# Patient Record
Sex: Female | Born: 1990
Health system: Southern US, Community
[De-identification: ages and names within clinical notes are randomized; demographics above are authoritative.]

## PROBLEM LIST (undated history)

## (undated) ENCOUNTER — Inpatient Hospital Stay (HOSPITAL_COMMUNITY): Payer: Self-pay

## (undated) DIAGNOSIS — G43909 Migraine, unspecified, not intractable, without status migrainosus: Secondary | ICD-10-CM

## (undated) DIAGNOSIS — N946 Dysmenorrhea, unspecified: Secondary | ICD-10-CM

## (undated) DIAGNOSIS — E119 Type 2 diabetes mellitus without complications: Secondary | ICD-10-CM

## (undated) DIAGNOSIS — N809 Endometriosis, unspecified: Secondary | ICD-10-CM

## (undated) DIAGNOSIS — R569 Unspecified convulsions: Secondary | ICD-10-CM

## (undated) HISTORY — DX: Endometriosis, unspecified: N80.9

## (undated) HISTORY — PX: NO PAST SURGERIES: SHX2092

---
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
## 2010-02-07 ENCOUNTER — Ambulatory Visit: Payer: Self-pay | Admitting: Family Medicine

## 2010-02-07 DIAGNOSIS — R300 Dysuria: Secondary | ICD-10-CM | POA: Insufficient documentation

## 2010-02-07 LAB — CONVERTED CEMR LAB
Glucose, Urine, Semiquant: NEGATIVE
Ketones, urine, test strip: NEGATIVE
Specific Gravity, Urine: 1.025
Urobilinogen, UA: 0.2

## 2010-06-21 ENCOUNTER — Ambulatory Visit: Payer: Self-pay | Admitting: Family Medicine

## 2010-06-21 DIAGNOSIS — R42 Dizziness and giddiness: Secondary | ICD-10-CM

## 2010-06-27 ENCOUNTER — Encounter: Payer: Self-pay | Admitting: Family Medicine

## 2010-07-07 ENCOUNTER — Encounter: Payer: Self-pay | Admitting: Family Medicine

## 2010-07-19 ENCOUNTER — Encounter: Payer: Self-pay | Admitting: Family Medicine

## 2010-07-26 ENCOUNTER — Encounter: Payer: Self-pay | Admitting: Family Medicine

## 2010-08-30 NOTE — Letter (Signed)
Summary: Generic Letter  [facility]  [address], [address]   [place], [place] [address]   Phone: [phone]  Fax: [phone]    06/27/2010  ORI KREITER [address] [place], [place]  [address]  Dear Ms. MARION,  We have scheduled you to see Dr. Gaetano Net (neurologists) for your migraines on 07/07/2010 @ 10:00am. Their office is located at [address] [facility] in [place]- phone number if needed is [phone]. Please keep this appointment and if unable please call their office to cancel and reschedule.         Sincerely,   Seymour Bars, DO

## 2010-08-30 NOTE — Assessment & Plan Note (Signed)
Summary: migraines   Vital Signs:  Patient profile:   20 year old female Height:      64.5 inches Weight:      115 pounds BMI:     19.51 O2 Sat:      100 % on Room air Temp:     98.6 degrees F oral Pulse rate:   71 / minute Pulse (ortho):   90 / minute BP sitting:   127 / 78  (left arm) BP standing:   114 / 72 Cuff size:   regular  Vitals Entered By: Payton Spark CMA (June 21, 2010 4:00 PM)  O2 Flow:  Room air  Serial Vital Signs/Assessments:  Time      Position  BP       Pulse  Resp  Temp     By 4:24 PM   Lying RA  115/66   71                    Payton Spark CMA 4:24 PM   Sitting   107/73   82                    Payton Spark CMA 4:24 PM   Standing  114/72   90                    Payton Spark CMA  CC: Migraines   Primary Care Provider:  Seymour Bars DO  CC:  Migraines.  History of Present Illness: 20 yo WF presents for migraines that have started to occur more frequently the past 2 mos.  They are occuring  ~5 x a wk.  She is waking up with them sometimes.  She is using Tylenol or Excedrin migraine which helps little.  She denies photo or phonophobia.  She has mild nausea, no vomitting.  Has tingling in arms and shooting pains to the neck with migraines.  She took Amitriptyline last year which helped but she c/o taking it regularly and it causing too much sleepiness.  She is eating only 1 meal/ day and feels 'like she's going to pass out' if she gets up too quickly.  Her family is concerned that she has a fam hx of seizures though no one has witness any sz - like activity from her.    Current Medications (verified): 1)  None  Allergies (verified): No Known Drug Allergies  Past History:  Past Medical History: Reviewed history from 07/29/2009 and no changes required. MRSA 3-09. migraines  Social History: Reviewed history from 03/21/2007 and no changes required. Student at [employer] Lives w/ mom, dad and younger brother. Nonsmoker. Fair grades. No  extra-curricular activities  Review of Systems      See HPI  Physical Exam  General:  alert, well-developed, well-nourished, and well-hydrated.   Head:  normocephalic and atraumatic.   Eyes:  pupils equal, pupils round, and pupils reactive to light.   Ears:  EACs patent; TMs translucent and gray with good cone of light and bony landmarks.  Mouth:  good dentition and pharynx pink and moist.   Neck:  no masses.   Lungs:  Normal respiratory effort, chest expands symmetrically. Lungs are clear to auscultation, no crackles or wheezes. Heart:  Normal rate and regular rhythm. S1 and S2 normal without gallop, murmur, click, rub or other extra sounds. Neurologic:  cranial nerves II-XII intact and gait normal.   Skin:  color normal.   Psych:  good eye contact, not  anxious appearing, and not depressed appearing.     Impression & Recommendations:  Problem # 1:  MIGRAINE-COMMON (ICD-346.10) Assessment Deteriorated Having almost daily HAs again, off prophylactic meds. H/o given on migraine causes/ prevention today. Will start her on Topamax for migraine prevention with rescue use of Naratriptan for severe HAs.   Will refer to neurology per family wishes given fam hx of seizures, though I think this is unrelated to her migraines.   The following medications were removed from the medication list:    Sumatriptan Succinate 100 Mg Tabs (Sumatriptan succinate) .Marland Kitchen... 1 tab by mouth x 1 as needed migraine onset; repeat in 2 hrs if needed Her updated medication list for this problem includes:    Naratriptan Hcl 2.5 Mg Tabs (Naratriptan hcl) .Marland Kitchen... 1 tab by mouth x 1 as needed migraine onset; repeat in 4 hrs if needed x 1  Orders: Neurology Referral (Neuro)  Problem # 2:  ORTHOSTATIC DIZZINESS (ICD-780.4) Ashley Watson's orthostatic BPs are normal today showing no drop with standing.   She may be having some neurologic symptoms with her migraines or this could be from inadequate food intake (eating only  once a day).  We discussed adequate hydration.    Complete Medication List: 1)  Topamax 25 Mg Tabs (Topiramate) .Marland Kitchen.. 1 tab by mouth at bedtime x 1 wk then increase to 2 tabs by mouth qhs 2)  Naratriptan Hcl 2.5 Mg Tabs (Naratriptan hcl) .Marland Kitchen.. 1 tab by mouth x 1 as needed migraine onset; repeat in 4 hrs if needed x 1  Patient Instructions: 1)  Read thru info on migraines. 2)  Referral made to Dr Gaetano Net. 3)  Start on Topamax - 25 mg at bedtime for the first wk, then increase to 50 mg at bedtime.  Dr Gaetano Net may adjust this up. 4)  Use Naratriptan for migraine rescue.  Take within first 1 of onset.  Do not take more than 2 x a wk. 5)  Use Excedrin Migraine for mild to moderate headache.  Do not take more than 3 x a wk. Prescriptions: NARATRIPTAN HCL 2.5 MG TABS (NARATRIPTAN HCL) 1 tab by mouth x 1 as needed migraine onset; repeat in 4 hrs if needed x 1  #9 tabs x 1   Entered and Authorized by:   Seymour Bars DO   Signed by:   Seymour Bars DO on 06/21/2010   Method used:   Electronically to        [facility]* (retail)       [address] [place], [place]  [address]       Ph: [phone]       Fax: [phone]   RxID:   [id number] TOPAMAX 25 MG TABS (TOPIRAMATE) 1 tab by mouth at bedtime x 1 wk then increase to 2 tabs by mouth qhs  #60 x 1   Entered and Authorized by:   Seymour Bars DO   Signed by:   Seymour Bars DO on 06/21/2010   Method used:   Electronically to        [facility]* (retail)       [address] [place], [place]  [address]       Ph: [phone]       Fax: [phone]   RxID:   [id number]    Orders Added: 1)  Neurology Referral [Neuro] 2)  Est. Patient Level IV [09323]

## 2010-08-30 NOTE — Assessment & Plan Note (Signed)
Summary: UA  Nurse Visit  CC: dysuria, lower pelvic pain, some frequency- started 1 week ago   Allergies: No Known Drug Allergies Laboratory Results   Urine Tests  Date/Time Received: 02/07/2010 Date/Time Reported: 02/07/2010  Routine Urinalysis   Color: brown Appearance: Cloudy Glucose: negative   (Normal Range: Negative) Bilirubin: negative   (Normal Range: Negative) Ketone: negative   (Normal Range: Negative) Spec. Gravity: 1.025   (Normal Range: 1.003-1.035) Blood: large   (Normal Range: Negative) pH: 5.5   (Normal Range: 5.0-8.0) Protein: >=300   (Normal Range: Negative) Urobilinogen: 0.2   (Normal Range: 0-1) Nitrite: negative   (Normal Range: Negative) Leukocyte Esterace: large   (Normal Range: Negative)       Orders Added: 1)  UA Dipstick w/o Micro (automated)  [81003] Prescriptions: MACROBID 100 MG CAPS (NITROFURANTOIN MONOHYD MACRO) 1 capsule by mouth q 12 hrs x 7 days  #14 x 0   Entered and Authorized by:   Seymour Bars DO   Signed by:   Seymour Bars DO on 02/07/2010   Method used:   Electronically to        [facility] [phone]* (retail)       [address] [place], [place]  [address]       Ph: [phone]       Fax: [phone]   RxID:   [id number]     Impression & Recommendations:  Problem # 1:  ACUTE CYSTITIS (ICD-595.0) UA grossly + for infection.  Treat with Macrobid x 7 days.  If symptoms are not improved by FRI AM, return to recollect Urine for UA and cx. The following medications were removed from the medication list:    Bactrim Ds 800-160 Mg Tabs (Sulfamethoxazole-trimethoprim) .Marland Kitchen... 1 tab by mouth two times a day x 3 days Her updated medication list for this problem includes:    Macrobid 100 Mg Caps (Nitrofurantoin monohyd macro) .Marland Kitchen... 1 capsule by mouth q 12 hrs x 7 days  Orders: UA Dipstick w/o Micro (automated)  (81003)  Complete Medication List: 1)  Jolessa 0.15-0.03 Mg Tabs (Levonorgest-eth estrad 91-day) .Marland Kitchen.. 1 tab by  mouth daily as directed 2)  Amitriptyline Hcl 25 Mg Tabs (Amitriptyline hcl) .Marland Kitchen.. 1 tab by mouth qhs 3)  Sumatriptan Succinate 100 Mg Tabs (Sumatriptan succinate) .Marland Kitchen.. 1 tab by mouth x 1 as needed migraine onset; repeat in 2 hrs if needed 4)  Macrobid 100 Mg Caps (Nitrofurantoin monohyd macro) .Marland Kitchen.. 1 capsule by mouth q 12 hrs x 7 days   Patient Instructions: 1)  Take 7 days of Macrobid for UTI. 2)  If symptoms have not resolved by Friday AM, call to recollect urine.

## 2010-09-01 NOTE — Consult Note (Signed)
Summary: [facility]  [facility]   Imported By: Lanelle Bal 07/15/2010 10:56:51  _____________________________________________________________________  External Attachment:    Type:   Image     Comment:   External Document

## 2010-09-01 NOTE — Letter (Signed)
Summary: [facility]  [facility]   Imported By: Lanelle Bal 08/10/2010 09:20:55  _____________________________________________________________________  External Attachment:    Type:   Image     Comment:   External Document

## 2012-03-14 ENCOUNTER — Ambulatory Visit (INDEPENDENT_AMBULATORY_CARE_PROVIDER_SITE_OTHER): Payer: BC Managed Care – PPO | Admitting: Sports Medicine

## 2012-03-14 ENCOUNTER — Encounter: Payer: Self-pay | Admitting: Sports Medicine

## 2012-03-14 VITALS — BP 128/71 | HR 90 | Temp 98.1°F | Resp 16 | Wt 103.0 lb

## 2012-03-14 DIAGNOSIS — B349 Viral infection, unspecified: Secondary | ICD-10-CM | POA: Insufficient documentation

## 2012-03-14 DIAGNOSIS — B9789 Other viral agents as the cause of diseases classified elsewhere: Secondary | ICD-10-CM

## 2012-03-14 MED ORDER — NYSTATIN 100000 UNIT/ML MT SUSP: 500000.0000 [IU] | Freq: Four times a day (QID) | OROMUCOSAL | Status: AC

## 2012-03-14 MED ORDER — MELOXICAM 15 MG PO TABS: ORAL_TABLET | ORAL | Status: DC

## 2012-03-14 NOTE — Patient Instructions (Signed)
Thrush, Adult   Thrush is a yeast infection that develops in the mouth and throat and on the tongue. The medical term for this is oropharyngeal candidiasis, or OPC. Thrush is most common in older adults, but it can occur at any age. Thrush occurs when a yeast called candida grows out of control. Candida normally is present in small amounts in the mouth and on other mucous membranes. However, under certain circumstances, candida can grow rapidly, causing thrush. Thrush can be a recurring problem for people who have chronic illnesses or who take medications that limit the body's ability to fight infection (weakened immune system). Since these people have difficulty fighting infections, the fungus that causes thrush can spread throughout the body. This can cause life-threatening blood or organ infections.  CAUSES   Candida, the yeast that causes thrush, is normally present in small amounts in the mouth and on other mucous membranes. It usually causes no harm. However, when conditions are present that allow the yeast to grow uncontrolled, it invades surrounding tissues and becomes an infection. Thrush is most commonly caused by the yeast Candida albicans. Less often, other forms of candida can lead to thrush.  There are many types of bacteria in your mouth that normally control the growth of candida. Sometimes a new type of bacteria gets into your mouth and disrupts the balance of the germs already there. This can allow candida to overgrow. Other factors that increase your risk of developing thrush include:   An impaired ability to fight infection (weakened immune system). A normal immune system is usually strong enough to prevent candida from overgrowing.   Older adults are more likely to develop thrush because they may have weaker immune systems.   People with human immunodeficiency virus (HIV) infection have a high likelihood of developing thrush. About 90% of people with HIV develop thrush at some point during  the course of their disease.   People with diabetes are more likely to get thrush because high blood sugar levels promote overgrowth of the candida fungus.   A dry mouth (xerostomia). Dry mouth can result from overuse of mouthwashes or from certain conditions such as Sjgren's syndrome.   Pregnancy. Hormone changes during pregnancy can lead to thrush by altering the balance of bacteria in the mouth.   Poor dental care, especially in people who have false teeth.   The use of antibiotic medications. This may lead to thrush by changing the balance of bacteria in the mouth.  SYMPTOMS   Thrush can be a mild infection that causes no symptoms. If symptoms develop, they may include the following:   A burning feeling in the mouth and throat. This can occur at the start of a thrush infection.   White patches that adhere to the mouth and tongue. The tissue around the patches may be red, raw, and painful. If rubbed (during tooth brushing, for example), the patches and the tissue of the mouth may bleed easily.   A bad taste in the mouth or difficulty tasting foods.   Cottony feeling in the mouth.   Sometimes pain during eating and swallowing.  DIAGNOSIS   Your caregiver can usually diagnose thrush by exam. In addition to looking in your mouth, your caregiver will ask you questions about your health.  TREATMENT   Medications that help prevent the growth of fungi (antifungals) are the standard treatment for thrush. These medications are either applied directly to the affected area (topical) or swallowed (oral).  Mild thrush  In   adults, mild cases of thrush may clear up with simple treatment that can be done at home. This treatment usually involves using an antifungal mouth rinse or lozenges. Treatment usually lasts about 14 days.  Moderate to severe thrush   More severe thrush infections that have spread to the esophagus are treated with an oral antifungal medication. A topical antifungal medication may also be  used.   For some severe infections, a treatment period longer than 14 days may be needed.   Oral antifungal medications are almost never used during pregnancy because the fetus may be harmed. However, if a pregnant woman has a rare, severe thrush infection that has spread to her blood, oral antifungal medications may be used. In this case, the risk of harm to the mother and fetus from the severe thrush infection may be greater than the risk posed by the use of antifungal medications.  Persistent or recurrent thrush  Persistent (does not go away) or recurrent (keeps coming back) cases of thrush may:   Need to be treated twice as long as the symptoms last.   Require treatment with both oral and topical antifungal medications.   People with weakened immune systems can take an antifungal medication on a continuous basis to prevent thrush infections.  It is important to treat conditions that make you more likely to get thrush, such as diabetes, human immunodeficiency virus (HIV), or cancer.   HOME CARE INSTRUCTIONS    If you are breast-feeding, you should clean your nipples with an antifungal medication, such as nystatin (Mycostatin). Dry your nipples after breast-feeding. Applying lanolin-containing body lotion may help relieve nipple soreness.   If you wear dentures and get thrush, remove dentures before going to bed, brush them vigorously, and soak in a solution of chlorhexidine gluconate or a product such as Polident or Efferdent.   Eating plain, unflavored yogurt that contains live cultures (check the label) can also help cure thrush. Yogurt helps healthy bacteria grow in the mouth. These bacteria stop the growth of the yeast that causes thrush.   Adults can treat thrush at home with gentian violet (1%), a dye that kills bacteria and fungi. It is available without a prescription. If there is no known cause for the infection or if gentian violet does not cure the thrush, you need to see your  caregiver.  Comfort measures  Measures can be taken to reduce the discomfort of thrush:   Drink cold liquids such as water or iced tea. Eat flavored ice treats or frozen juices.   Eat foods that are easy to swallow such as gelatin, ice cream, or custard.   If the patches are painful, try drinking from a straw.   Rinse your mouth several times a day with a warm saltwater rinse. You can make the saltwater mixture with 1 tsp (5 g) of salt in 8 fl oz (0.2 L) of warm water.  PROGNOSIS    Most cases of thrush are mild and clear up with the use of an antifungal mouth rinse or lozenges. Very mild cases of thrush may clear up without medical treatment. It usually takes about 14 days of treatment with an oral antifungal medication to cure more severe thrush infections. In some cases, thrush may last several weeks even with treatment.   If thrush goes untreated and does not go away by itself, it can spread to other parts of the body.   Thrush can spread to the throat, the vagina, or the skin. It rarely spreads   to other organs of the body.  Thrush is more likely to recur (come back) in:   People who use inhaled corticosteroids to treat asthma.   People who take antibiotic medications for a long time.   People who have false teeth.   People who have a weakened immune system.  RISKS AND COMPLICATIONS  Complications related to thrush are rare in healthy people.  There are several factors that can increase your risk of developing thrush.  Age  Older adults, especially those who have serious health problems, are more likely to develop thrush because their immune systems are likely to be weaker.  Behavior   The yeast that causes thrush can be spread by oral sex.   Heavy smoking can lower the body's ability to fight off infections. This makes thrush more likely to develop.  Other conditions   False teeth (dentures), braces, or a retainer that irritates the mouth make it hard to keep the mouth clean. An unclean mouth is  more likely to develop thrush than a clean mouth.   People with a weakened immune system, such as those who have diabetes or human immunodeficiency virus (HIV) or who are undergoing chemotherapy, have an increased risk for developing thrush.  Medications  Some medications can allow the fungus that causes thrush to grow uncontrolled. Common ones are:   Antibiotics, especially those that kill a wide range of organisms (broad-spectrum antibiotics), such as tetracycline commonly can cause thrush.   Birth control pills (oral contraceptives).   Medications that weaken the body's immune system, such as corticosteroids.  Environment  Exposure over time to certain environmental chemicals, such as benzene and pesticides, can weaken the body's immune system. This increases your risk for developing infections, including thrush.  SEEK IMMEDIATE MEDICAL CARE IF:   Your symptoms are getting worse or are not improving within 7 days of starting treatment.   You have symptoms of spreading infection, such as white patches on the skin outside of the mouth.   You are nursing and you have redness and pain in the nipples in spite of home treatment or if you have burning pain in the nipple area when you nurse. Your baby's mouth should also be examined to determine whether thrush is causing your symptoms.  Document Released: 04/11/2004 Document Revised: 07/06/2011 Document Reviewed: 07/22/2008  [employer] Patient Information 2012 [employer].

## 2012-03-14 NOTE — Assessment & Plan Note (Addendum)
Spots on tongue, malaise, sore throat, cough. We will give this 2 weeks, I will give her MOBIC in the meantime. We'll give her some nystatin to swish. I would like to see her back in 2 weeks, and she did have some weight loss approximately 10 pounds over a few years. If still no better at that point we would consider checking CBC, CMP, TSH, HIV, PPD. She's also due for Tdap.

## 2012-03-14 NOTE — Progress Notes (Signed)
Patient ID: APURVA REILY, female   DOB: [birth year], 21 y.o.   MRN: [id number] Subjective:    CC: Spots on tongue, cough.  HPI: Elain is a very pleasant 21 year old healthy female who comes in with symptoms of sore throat, spots on tongue that are whitish, malaise, cough for approximately 5 days. She denies any sick contacts, denies any headaches, neck stiffness, nausea, or vomiting, or diarrhea. She denies any rashes. She does cough occasionally, but this is essentially nonproductive. She's not had any changes in medication. She also denies any fevers, or chills. No muscle aches, or body aches. No shortness of breath.  Past medical history, Surgical history, Family history, Social history, Allergies, and medications have been entered into the medical record, reviewed, and no changes needed.   Review of Systems: No fevers, chills, night sweats, weight loss, chest pain, or shortness of breath.   Objective:    General: Well Developed, well nourished, and in no acute distress.  Neuro: Alert and oriented x3, extra-ocular muscles intact.  HEENT: Normocephalic, atraumatic, pupils equal round reactive to light, neck supple, no masses, no lymphadenopathy, thyroid nonpalpable.  There is some thrush on her tongue. She also has a mildly erythematous oropharynx. Skin: Warm and dry, no rashes. Cardiac: Regular rate and rhythm, no murmurs rubs or gallops.  Respiratory: Clear to auscultation bilaterally. Not using accessory muscles, speaking in full sentences.  Impression and Recommendations:

## 2012-03-28 ENCOUNTER — Ambulatory Visit: Payer: BC Managed Care – PPO | Admitting: Sports Medicine

## 2012-03-28 DIAGNOSIS — Z0289 Encounter for other administrative examinations: Secondary | ICD-10-CM

## 2012-04-19 ENCOUNTER — Ambulatory Visit (INDEPENDENT_AMBULATORY_CARE_PROVIDER_SITE_OTHER): Payer: BC Managed Care – PPO | Admitting: Sports Medicine

## 2012-04-19 ENCOUNTER — Other Ambulatory Visit: Payer: Self-pay | Admitting: Sports Medicine

## 2012-04-19 ENCOUNTER — Encounter: Payer: Self-pay | Admitting: Sports Medicine

## 2012-04-19 VITALS — BP 141/84 | HR 119 | Temp 97.4°F | Wt 105.0 lb

## 2012-04-19 DIAGNOSIS — Z299 Encounter for prophylactic measures, unspecified: Secondary | ICD-10-CM

## 2012-04-19 DIAGNOSIS — B349 Viral infection, unspecified: Secondary | ICD-10-CM

## 2012-04-19 DIAGNOSIS — Z0000 Encounter for general adult medical examination without abnormal findings: Secondary | ICD-10-CM | POA: Insufficient documentation

## 2012-04-19 DIAGNOSIS — Z23 Encounter for immunization: Secondary | ICD-10-CM

## 2012-04-19 DIAGNOSIS — N3000 Acute cystitis without hematuria: Secondary | ICD-10-CM

## 2012-04-19 DIAGNOSIS — Z298 Encounter for other specified prophylactic measures: Secondary | ICD-10-CM

## 2012-04-19 DIAGNOSIS — N390 Urinary tract infection, site not specified: Secondary | ICD-10-CM

## 2012-04-19 LAB — POCT URINALYSIS DIPSTICK
Glucose, UA: NEGATIVE
Ketones, UA: 15
Nitrite, UA: POSITIVE
Protein, UA: >=300
Spec Grav, UA: 1.025
Urobilinogen, UA: 1
pH, UA: 6

## 2012-04-19 MED ORDER — PHENAZOPYRIDINE HCL 200 MG PO TABS: 200.0000 mg | ORAL_TABLET | Freq: Three times a day (TID) | ORAL | Status: AC

## 2012-04-19 MED ORDER — CEPHALEXIN 500 MG PO CAPS: 500.0000 mg | ORAL_CAPSULE | Freq: Two times a day (BID) | ORAL | Status: DC

## 2012-04-19 NOTE — Progress Notes (Signed)
Subjective:    CC: Thrush, dysuria  HPI: Thrush: Have been seeing her for this for a couple of weeks now. I wanted her do some nystatin swash twice a day. Unfortunately she ran out after only a couple of days. It's a little better, but overall still present. At that time, she had a small amount of weight loss as well as some heat, cold intolerance and we discussed checking some blood work at today's visit.  Dysuria: Present for a couple of days, painful in the suprapubic region, pain and burning when urinating. She is currently menstruating. No flank pain.  Past medical history, Surgical history, Family history, Social history, Allergies, and medications have been entered into the medical record, reviewed, and no changes needed.   Review of Systems: No fevers, chills, night sweats, weight loss, chest pain, or shortness of breath.   Objective:    General: Well Developed, well nourished, and in no acute distress.  Neuro: Alert and oriented x3, extra-ocular muscles intact.  HEENT: Normocephalic, atraumatic, pupils equal round reactive to light, neck supple, no masses, no lymphadenopathy, thyroid nonpalpable. Whitish plaques still present on the tongue, but not the rest of her mouth. Skin: Warm and dry, no rashes. Cardiac: Regular rate and rhythm, no murmurs rubs or gallops.  Respiratory: Clear to auscultation bilaterally. Not using accessory muscles, speaking in full sentences. Abdomen: Soft, nontender, nondistended with positive bowel sounds. No CVA tenderness.  Urinalysis was positive for blood, leukocytes, as well as nitrite. This was sent for culture  Impression and Recommendations:

## 2012-04-19 NOTE — Assessment & Plan Note (Signed)
With nitrates, and pyuria we'll treat as a acute cystitis. Cephalexin for 7 days. Pyridium Urine will be sent for culture.

## 2012-04-19 NOTE — Assessment & Plan Note (Signed)
Referral to gynecology for first Pap smear.

## 2012-04-19 NOTE — Assessment & Plan Note (Signed)
Her weight has improved, but the thrush is still present, unfortunately she only used for a few days and nystatin swish. I will refill her nystatin. Also get some blood work.

## 2012-04-20 LAB — COMPREHENSIVE METABOLIC PANEL
ALT: 11 U/L (ref 0–35)
Albumin: 4.8 g/dL (ref 3.5–5.2)
CO2: 28 mEq/L (ref 19–32)
Chloride: 102 mEq/L (ref 96–112)
Glucose, Bld: 79 mg/dL (ref 70–99)
Potassium: 4.5 mEq/L (ref 3.5–5.3)
Sodium: 137 mEq/L (ref 135–145)
Total Bilirubin: 1 mg/dL (ref 0.3–1.2)
Total Protein: 7.1 g/dL (ref 6.0–8.3)

## 2012-04-20 LAB — CBC
HCT: 45.5 % (ref 36.0–46.0)
Hemoglobin: 15.3 g/dL — ABNORMAL HIGH (ref 12.0–15.0)
MCH: 31.9 pg (ref 26.0–34.0)
MCHC: 33.6 g/dL (ref 30.0–36.0)
MCV: 95 fL (ref 78.0–100.0)
Platelets: 217 10*3/uL (ref 150–400)
RBC: 4.79 MIL/uL (ref 3.87–5.11)
RDW: 14.4 % (ref 11.5–15.5)
WBC: 16.1 10*3/uL — ABNORMAL HIGH (ref 4.0–10.5)

## 2012-04-20 LAB — COMPREHENSIVE METABOLIC PANEL WITH GFR
AST: 18 U/L (ref 0–37)
Alkaline Phosphatase: 78 U/L (ref 39–117)
BUN: 10 mg/dL (ref 6–23)
Calcium: 9.6 mg/dL (ref 8.4–10.5)
Creat: 0.78 mg/dL (ref 0.50–1.10)

## 2012-04-20 LAB — HIV ANTIBODY (ROUTINE TESTING W REFLEX): HIV: NONREACTIVE

## 2012-04-20 LAB — TSH: TSH: 0.355 u[IU]/mL (ref 0.350–4.500)

## 2012-04-20 NOTE — Addendum Note (Signed)
Addended by: Monica Becton on: 04/20/2012 06:13 PM   Modules accepted: Orders

## 2012-04-22 ENCOUNTER — Encounter: Payer: Self-pay | Admitting: Sports Medicine

## 2012-04-22 LAB — T3, FREE: T3, Free: 3.4 pg/mL (ref 2.3–4.2)

## 2012-04-22 LAB — T4, FREE: Free T4: 1.5 ng/dL (ref 0.80–1.80)

## 2012-04-25 LAB — URINE CULTURE: Colony Count: >=100000

## 2012-05-07 ENCOUNTER — Encounter: Payer: Self-pay | Admitting: Obstetrics & Gynecology

## 2012-05-07 ENCOUNTER — Ambulatory Visit (INDEPENDENT_AMBULATORY_CARE_PROVIDER_SITE_OTHER): Payer: BC Managed Care – PPO | Admitting: Obstetrics & Gynecology

## 2012-05-07 VITALS — BP 125/76 | HR 96 | Temp 97.9°F | Resp 16 | Ht 65.0 in | Wt 107.0 lb

## 2012-05-07 DIAGNOSIS — Z23 Encounter for immunization: Secondary | ICD-10-CM

## 2012-05-07 DIAGNOSIS — Z113 Encounter for screening for infections with a predominantly sexual mode of transmission: Secondary | ICD-10-CM

## 2012-05-07 DIAGNOSIS — N898 Other specified noninflammatory disorders of vagina: Secondary | ICD-10-CM

## 2012-05-07 DIAGNOSIS — Z01419 Encounter for gynecological examination (general) (routine) without abnormal findings: Secondary | ICD-10-CM

## 2012-05-07 DIAGNOSIS — Z3009 Encounter for other general counseling and advice on contraception: Secondary | ICD-10-CM

## 2012-05-07 DIAGNOSIS — B373 Candidiasis of vulva and vagina: Secondary | ICD-10-CM

## 2012-05-07 DIAGNOSIS — Z124 Encounter for screening for malignant neoplasm of cervix: Secondary | ICD-10-CM

## 2012-05-07 DIAGNOSIS — Z7189 Other specified counseling: Secondary | ICD-10-CM

## 2012-05-07 DIAGNOSIS — Z30011 Encounter for initial prescription of contraceptive pills: Secondary | ICD-10-CM

## 2012-05-07 MED ORDER — NORETHIN-ETH ESTRAD-FE BIPHAS 1 MG-10 MCG / 10 MCG PO TABS: 1.0000 | ORAL_TABLET | Freq: Every day | ORAL | Status: DC

## 2012-05-07 NOTE — Progress Notes (Signed)
  Subjective:     Ashley Watson is a 21 y.o. G0 female and is here for initial pap smear and to establish GYN care. She also desires STI testing.  She is sexually active with one female partner, uses condoms.  Desires OCPs for contraception, will continue condoms for STI prevention. The patient reports occasional abnormal vaginal discharge, no other GYN concerns.  History   Social History  . Marital Status: Single    Spouse Name: N/A    Number of Children: N/A  . Years of Education: N/A   Occupational History  . Not on file.   Social History Main Topics  . Smoking status: Never Smoker   . Smokeless tobacco: Never Used  . Alcohol Use: 3.6 oz/week    6 Cans of beer per week  . Drug Use: No  . Sexually Active: Yes   Other Topics Concern  . Not on file   Social History Narrative  . No narrative on file   Health Maintenance  Topic Date Due  . Pap Smear  04/10/2009  . Tetanus/tdap  04/10/2010  . Influenza Vaccine  03/31/2013    The following portions of the patient's history were reviewed and updated as appropriate: allergies, current medications, past family history, past medical history, past social history, past surgical history and problem list.  Review of Systems Pertinent items are noted in HPI.   Objective:   Blood pressure 125/76, pulse 96, temperature 97.9 F (36.6 C), temperature source Oral, resp. rate 16, height 5\' 5"  (1.651 m), weight 107 lb (48.535 kg), last menstrual period 04/11/2012. GENERAL: Well-developed, well-nourished female in no acute distress.  HEENT: Normocephalic, atraumatic. Sclerae anicteric.  NECK: Supple. Normal thyroid.  LUNGS: Clear to auscultation bilaterally.  HEART: Regular rate and rhythm. BREASTS: Symmetric in size. No masses, skin changes, nipple drainage, or lymphadenopathy. ABDOMEN: Soft, nontender, nondistended. No organomegaly. PELVIC: Normal external female genitalia. Vagina is pink and rugated.  Brown discharge noted, wet prep  obtained. Normalnulliparous cervix contour. Pap smear obtained. Uterus is normal in size. No adnexal mass or tenderness.  EXTREMITIES: No cyanosis, clubbing, or edema, 2+ distal pulses.   Assessment:    Healthy female exam.  STI screen Vaginal discharge OCP for contraception     Plan:    Pap and wet prep done, follow up results and manage accordingly. STI screen to be drawn, follow up results and manage accordingly. Counseled about Gardasil, patient to receive first injection today OCP samples given to patient Vulvar hygiene practices emphasized Routine preventative health maintenance measures emphasized

## 2012-05-07 NOTE — Patient Instructions (Signed)
Preventive Care for Adults, Female A healthy lifestyle and preventive care can promote health and wellness. Preventive health guidelines for women include the following key practices.  A routine yearly physical is a good way to check with your caregiver about your health and preventive screening. It is a chance to share any concerns and updates on your health, and to receive a thorough exam.  Visit your dentist for a routine exam and preventive care every 6 months. Brush your teeth twice a day and floss once a day. Good oral hygiene prevents tooth decay and gum disease.  The frequency of eye exams is based on your age, health, family medical history, use of contact lenses, and other factors. Follow your caregiver's recommendations for frequency of eye exams.  Eat a healthy diet. Foods like vegetables, fruits, whole grains, low-fat dairy products, and lean protein foods contain the nutrients you need without too many calories. Decrease your intake of foods high in solid fats, added sugars, and salt. Eat the right amount of calories for you.Get information about a proper diet from your caregiver, if necessary.  Regular physical exercise is one of the most important things you can do for your health. Most adults should get at least 150 minutes of moderate-intensity exercise (any activity that increases your heart rate and causes you to sweat) each week. In addition, most adults need muscle-strengthening exercises on 2 or more days a week.  Maintain a healthy weight. The body mass index (BMI) is a screening tool to identify possible weight problems. It provides an estimate of body fat based on height and weight. Your caregiver can help determine your BMI, and can help you achieve or maintain a healthy weight.For adults 20 years and older:  A BMI below 18.5 is considered underweight.  A BMI of 18.5 to 24.9 is normal.  A BMI of 25 to 29.9 is considered overweight.  A BMI of 30 and above is  considered obese.  Maintain normal blood lipids and cholesterol levels by exercising and minimizing your intake of saturated fat. Eat a balanced diet with plenty of fruit and vegetables. Blood tests for lipids and cholesterol should begin at age 41 and be repeated every 5 years. If your lipid or cholesterol levels are high, you are over 50, or you are at high risk for heart disease, you may need your cholesterol levels checked more frequently.Ongoing high lipid and cholesterol levels should be treated with medicines if diet and exercise are not effective.  If you smoke, find out from your caregiver how to quit. If you do not use tobacco, do not start.  If you are pregnant, do not drink alcohol. If you are breastfeeding, be very cautious about drinking alcohol. If you are not pregnant and choose to drink alcohol, do not exceed 1 drink per day. One drink is considered to be 12 ounces (355 mL) of beer, 5 ounces (148 mL) of wine, or 1.5 ounces (44 mL) of liquor.  Avoid use of street drugs. Do not share needles with anyone. Ask for help if you need support or instructions about stopping the use of drugs.  High blood pressure causes heart disease and increases the risk of stroke. Your blood pressure should be checked at least every 1 to 2 years. Ongoing high blood pressure should be treated with medicines if weight loss and exercise are not effective.  If you are 65 to 21 years old, ask your caregiver if you should take aspirin to prevent strokes.  Diabetes  screening involves taking a blood sample to check your fasting blood sugar level. This should be done once every 3 years, after age 45, if you are within normal weight and without risk factors for diabetes. Testing should be considered at a younger age or be carried out more frequently if you are overweight and have at least 1 risk factor for diabetes.  Breast cancer screening is essential preventive care for women. You should practice "breast  self-awareness." This means understanding the normal appearance and feel of your breasts and may include breast self-examination. Any changes detected, no matter how small, should be reported to a caregiver. Women in their 20s and 30s should have a clinical breast exam (CBE) by a caregiver as part of a regular health exam every 1 to 3 years. After age 40, women should have a CBE every year. Starting at age 40, women should consider having a mammography (breast X-ray test) every year. Women who have a family history of breast cancer should talk to their caregiver about genetic screening. Women at a high risk of breast cancer should talk to their caregivers about having magnetic resonance imaging (MRI) and a mammography every year.  The Pap test is a screening test for cervical cancer. A Pap test can show cell changes on the cervix that might become cervical cancer if left untreated. A Pap test is a procedure in which cells are obtained and examined from the lower end of the uterus (cervix).  Women should have a Pap test starting at age 21.  Between ages 21 and 29, Pap tests should be repeated every 2 years.  Beginning at age 30, you should have a Pap test every 3 years as long as the past 3 Pap tests have been normal.  Some women have medical problems that increase the chance of getting cervical cancer. Talk to your caregiver about these problems. It is especially important to talk to your caregiver if a new problem develops soon after your last Pap test. In these cases, your caregiver may recommend more frequent screening and Pap tests.  The above recommendations are the same for women who have or have not gotten the vaccine for human papillomavirus (HPV).  If you had a hysterectomy for a problem that was not cancer or a condition that could lead to cancer, then you no longer need Pap tests. Even if you no longer need a Pap test, a regular exam is a good idea to make sure no other problems are  starting.  If you are between ages 65 and 70, and you have had normal Pap tests going back 10 years, you no longer need Pap tests. Even if you no longer need a Pap test, a regular exam is a good idea to make sure no other problems are starting.  If you have had past treatment for cervical cancer or a condition that could lead to cancer, you need Pap tests and screening for cancer for at least 20 years after your treatment.  If Pap tests have been discontinued, risk factors (such as a new sexual partner) need to be reassessed to determine if screening should be resumed.  The HPV test is an additional test that may be used for cervical cancer screening. The HPV test looks for the virus that can cause the cell changes on the cervix. The cells collected during the Pap test can be tested for HPV. The HPV test could be used to screen women aged 30 years and older, and should   be used in women of any age who have unclear Pap test results. After the age of 30, women should have HPV testing at the same frequency as a Pap test.  Colorectal cancer can be detected and often prevented. Most routine colorectal cancer screening begins at the age of 50 and continues through age 75. However, your caregiver may recommend screening at an earlier age if you have risk factors for colon cancer. On a yearly basis, your caregiver may provide home test kits to check for hidden blood in the stool. Use of a small camera at the end of a tube, to directly examine the colon (sigmoidoscopy or colonoscopy), can detect the earliest forms of colorectal cancer. Talk to your caregiver about this at age 50, when routine screening begins. Direct examination of the colon should be repeated every 5 to 10 years through age 75, unless early forms of pre-cancerous polyps or small growths are found.  Hepatitis C blood testing is recommended for all people born from 1945 through 1965 and any individual with known risks for hepatitis C.  Practice  safe sex. Use condoms and avoid high-risk sexual practices to reduce the spread of sexually transmitted infections (STIs). STIs include gonorrhea, chlamydia, syphilis, trichomonas, herpes, HPV, and human immunodeficiency virus (HIV). Herpes, HIV, and HPV are viral illnesses that have no cure. They can result in disability, cancer, and death. Sexually active women aged 25 and younger should be checked for chlamydia. Older women with new or multiple partners should also be tested for chlamydia. Testing for other STIs is recommended if you are sexually active and at increased risk.  Osteoporosis is a disease in which the bones lose minerals and strength with aging. This can result in serious bone fractures. The risk of osteoporosis can be identified using a bone density scan. Women ages 65 and over and women at risk for fractures or osteoporosis should discuss screening with their caregivers. Ask your caregiver whether you should take a calcium supplement or vitamin D to reduce the rate of osteoporosis.  Menopause can be associated with physical symptoms and risks. Hormone replacement therapy is available to decrease symptoms and risks. You should talk to your caregiver about whether hormone replacement therapy is right for you.  Use sunscreen with sun protection factor (SPF) of 30 or more. Apply sunscreen liberally and repeatedly throughout the day. You should seek shade when your shadow is shorter than you. Protect yourself by wearing long sleeves, pants, a wide-brimmed hat, and sunglasses year round, whenever you are outdoors.  Once a month, do a whole body skin exam, using a mirror to look at the skin on your back. Notify your caregiver of new moles, moles that have irregular borders, moles that are larger than a pencil eraser, or moles that have changed in shape or color.  Stay current with required immunizations.  Influenza. You need a dose every fall (or winter). The composition of the flu vaccine  changes each year, so being vaccinated once is not enough.  Pneumococcal polysaccharide. You need 1 to 2 doses if you smoke cigarettes or if you have certain chronic medical conditions. You need 1 dose at age 65 (or older) if you have never been vaccinated.  Tetanus, diphtheria, pertussis (Tdap, Td). Get 1 dose of Tdap vaccine if you are younger than age 65, are over 65 and have contact with an infant, are a healthcare worker, are pregnant, or simply want to be protected from whooping cough. After that, you need a Td   booster dose every 10 years. Consult your caregiver if you have not had at least 3 tetanus and diphtheria-containing shots sometime in your life or have a deep or dirty wound.  HPV. You need this vaccine if you are a woman age 26 or younger. The vaccine is given in 3 doses over 6 months.  Measles, mumps, rubella (MMR). You need at least 1 dose of MMR if you were born in 1957 or later. You may also need a second dose.  Meningococcal. If you are age 19 to 21 and a first-year college student living in a residence hall, or have one of several medical conditions, you need to get vaccinated against meningococcal disease. You may also need additional booster doses.  Zoster (shingles). If you are age 60 or older, you should get this vaccine.  Varicella (chickenpox). If you have never had chickenpox or you were vaccinated but received only 1 dose, talk to your caregiver to find out if you need this vaccine.  Hepatitis A. You need this vaccine if you have a specific risk factor for hepatitis A virus infection or you simply wish to be protected from this disease. The vaccine is usually given as 2 doses, 6 to 18 months apart.  Hepatitis B. You need this vaccine if you have a specific risk factor for hepatitis B virus infection or you simply wish to be protected from this disease. The vaccine is given in 3 doses, usually over 6 months. Preventive Services / Frequency Ages 19 to 39  Blood  pressure check.** / Every 1 to 2 years.  Lipid and cholesterol check.** / Every 5 years beginning at age 20.  Clinical breast exam.** / Every 3 years for women in their 20s and 30s.  Pap test.** / Every 2 years from ages 21 through 29. Every 3 years starting at age 30 through age 65 or 70 with a history of 3 consecutive normal Pap tests.  HPV screening.** / Every 3 years from ages 30 through ages 65 to 70 with a history of 3 consecutive normal Pap tests.  Hepatitis C blood test.** / For any individual with known risks for hepatitis C.  Skin self-exam. / Monthly.  Influenza immunization.** / Every year.  Pneumococcal polysaccharide immunization.** / 1 to 2 doses if you smoke cigarettes or if you have certain chronic medical conditions.  Tetanus, diphtheria, pertussis (Tdap, Td) immunization. / A one-time dose of Tdap vaccine. After that, you need a Td booster dose every 10 years.  HPV immunization. / 3 doses over 6 months, if you are 26 and younger.  Measles, mumps, rubella (MMR) immunization. / You need at least 1 dose of MMR if you were born in 1957 or later. You may also need a second dose.  Meningococcal immunization. / 1 dose if you are age 19 to 21 and a first-year college student living in a residence hall, or have one of several medical conditions, you need to get vaccinated against meningococcal disease. You may also need additional booster doses.  Varicella immunization.** / Consult your caregiver.  Hepatitis A immunization.** / Consult your caregiver. 2 doses, 6 to 18 months apart.  Hepatitis B immunization.** / Consult your caregiver. 3 doses usually over 6 months. Ages 40 to 64  Blood pressure check.** / Every 1 to 2 years.  Lipid and cholesterol check.** / Every 5 years beginning at age 20.  Clinical breast exam.** / Every year after age 40.  Mammogram.** / Every year beginning at age 40   and continuing for as long as you are in good health. Consult with your  caregiver.  Pap test.** / Every 3 years starting at age 30 through age 65 or 70 with a history of 3 consecutive normal Pap tests.  HPV screening.** / Every 3 years from ages 30 through ages 65 to 70 with a history of 3 consecutive normal Pap tests.  Fecal occult blood test (FOBT) of stool. / Every year beginning at age 50 and continuing until age 75. You may not need to do this test if you get a colonoscopy every 10 years.  Flexible sigmoidoscopy or colonoscopy.** / Every 5 years for a flexible sigmoidoscopy or every 10 years for a colonoscopy beginning at age 50 and continuing until age 75.  Hepatitis C blood test.** / For all people born from 1945 through 1965 and any individual with known risks for hepatitis C.  Skin self-exam. / Monthly.  Influenza immunization.** / Every year.  Pneumococcal polysaccharide immunization.** / 1 to 2 doses if you smoke cigarettes or if you have certain chronic medical conditions.  Tetanus, diphtheria, pertussis (Tdap, Td) immunization.** / A one-time dose of Tdap vaccine. After that, you need a Td booster dose every 10 years.  Measles, mumps, rubella (MMR) immunization. / You need at least 1 dose of MMR if you were born in 1957 or later. You may also need a second dose.  Varicella immunization.** / Consult your caregiver.  Meningococcal immunization.** / Consult your caregiver.  Hepatitis A immunization.** / Consult your caregiver. 2 doses, 6 to 18 months apart.  Hepatitis B immunization.** / Consult your caregiver. 3 doses, usually over 6 months. Ages 65 and over  Blood pressure check.** / Every 1 to 2 years.  Lipid and cholesterol check.** / Every 5 years beginning at age 20.  Clinical breast exam.** / Every year after age 40.  Mammogram.** / Every year beginning at age 40 and continuing for as long as you are in good health. Consult with your caregiver.  Pap test.** / Every 3 years starting at age 30 through age 65 or 70 with a 3  consecutive normal Pap tests. Testing can be stopped between 65 and 70 with 3 consecutive normal Pap tests and no abnormal Pap or HPV tests in the past 10 years.  HPV screening.** / Every 3 years from ages 30 through ages 65 or 70 with a history of 3 consecutive normal Pap tests. Testing can be stopped between 65 and 70 with 3 consecutive normal Pap tests and no abnormal Pap or HPV tests in the past 10 years.  Fecal occult blood test (FOBT) of stool. / Every year beginning at age 50 and continuing until age 75. You may not need to do this test if you get a colonoscopy every 10 years.  Flexible sigmoidoscopy or colonoscopy.** / Every 5 years for a flexible sigmoidoscopy or every 10 years for a colonoscopy beginning at age 50 and continuing until age 75.  Hepatitis C blood test.** / For all people born from 1945 through 1965 and any individual with known risks for hepatitis C.  Osteoporosis screening.** / A one-time screening for women ages 65 and over and women at risk for fractures or osteoporosis.  Skin self-exam. / Monthly.  Influenza immunization.** / Every year.  Pneumococcal polysaccharide immunization.** / 1 dose at age 65 (or older) if you have never been vaccinated.  Tetanus, diphtheria, pertussis (Tdap, Td) immunization. / A one-time dose of Tdap vaccine if you are over   65 and have contact with an infant, are a Research scientist (physical sciences), or simply want to be protected from whooping cough. After that, you need a Td booster dose every 10 years.  Varicella immunization.** / Consult your caregiver.  Meningococcal immunization.** / Consult your caregiver.  Hepatitis A immunization.** / Consult your caregiver. 2 doses, 6 to 18 months apart.  Hepatitis B immunization.** / Check with your caregiver. 3 doses, usually over 6 months. ** Family history and personal history of risk and conditions may change your caregiver's recommendations. Document Released: 09/12/2001 Document Revised: 10/09/2011  Document Reviewed: 12/12/2010 [facility] Patient Information 2013 [place], [place].  Thank you for enrolling in MyChart. Please follow the instructions below to securely access your online medical record. MyChart allows you to send messages to your doctor, view your test results, manage appointments, and more.   How Do I Sign Up? 1. In your Internet browser, go to [employer] and enter [email or web address]. 2. Click on the Sign Up Now link in the Sign In box. You will see the New Member Sign Up page. 3. Enter your MyChart Access Code exactly as it appears below. You will not need to use this code after you've completed the sign-up process. If you do not sign up before the expiration date, you must request a new code. MyChart Access Code: [email or web address] Expires: 05/19/2012  1:36 PM  4. Enter your Social Security Number ([id number]) and Date of Birth (mm/dd/yyyy) as indicated and click Submit. You will be taken to the next sign-up page. 5. Create a MyChart ID. This will be your MyChart login ID and cannot be changed, so think of one that is secure and easy to remember. 6. Create a MyChart password. You can change your password at any time. 7. Enter your Password Reset Question and Answer. This can be used at a later time if you forget your password.  8. Enter your e-mail address. You will receive e-mail notification when new information is available in MyChart. 9. Click Sign Up. You can now view your medical record.   Additional Information Remember, MyChart is NOT to be used for urgent needs. For medical emergencies, dial 911.

## 2012-05-08 LAB — WET PREP, GENITAL: Trich, Wet Prep: NONE SEEN

## 2012-05-08 LAB — RPR

## 2012-05-08 MED ORDER — FLUCONAZOLE 150 MG PO TABS: 150.0000 mg | ORAL_TABLET | Freq: Once | ORAL | Status: DC

## 2012-05-08 NOTE — Addendum Note (Signed)
Addended by: Jaynie Collins A on: 05/08/2012 09:06 AM   Modules accepted: Orders

## 2012-05-08 NOTE — Progress Notes (Signed)
Wet prep showed few yeast, Diflucan prescribed.  Patient called to inform her of results and told to pick up prescription.

## 2012-05-14 ENCOUNTER — Telehealth: Payer: Self-pay | Admitting: *Deleted

## 2012-05-14 ENCOUNTER — Encounter: Payer: Self-pay | Admitting: *Deleted

## 2012-05-14 NOTE — Telephone Encounter (Signed)
Pt notified of negative pap and GC/Chlamydia.

## 2012-06-10 ENCOUNTER — Ambulatory Visit (INDEPENDENT_AMBULATORY_CARE_PROVIDER_SITE_OTHER): Payer: BC Managed Care – PPO | Admitting: Family Medicine

## 2012-06-10 ENCOUNTER — Encounter: Payer: Self-pay | Admitting: Family Medicine

## 2012-06-10 ENCOUNTER — Other Ambulatory Visit: Payer: Self-pay | Admitting: Family Medicine

## 2012-06-10 VITALS — BP 110/78 | HR 105 | Temp 98.9°F | Wt 101.0 lb

## 2012-06-10 DIAGNOSIS — J020 Streptococcal pharyngitis: Secondary | ICD-10-CM

## 2012-06-10 DIAGNOSIS — J029 Acute pharyngitis, unspecified: Secondary | ICD-10-CM

## 2012-06-10 DIAGNOSIS — B370 Candidal stomatitis: Secondary | ICD-10-CM

## 2012-06-10 DIAGNOSIS — R1310 Dysphagia, unspecified: Secondary | ICD-10-CM

## 2012-06-10 LAB — POCT RAPID STREP A (OFFICE): Rapid Strep A Screen: NEGATIVE

## 2012-06-10 MED ORDER — FLUCONAZOLE 100 MG PO TABS: ORAL_TABLET | ORAL | Status: DC

## 2012-06-10 MED ORDER — PENICILLIN V POTASSIUM 500 MG PO TABS: ORAL_TABLET | ORAL | Status: AC

## 2012-06-10 NOTE — Progress Notes (Signed)
CC: Ashley Watson is a 21 y.o. female is here for Sore Throat, Chills and Otalgia   Subjective: HPI:  Patient complains of 3 days sore throat worse with swallowing not influenced by movement of the neck. Seems to be getting no better since Saturday. No interventions as of yet. She feels somewhat rundown with some mild fatigue and has a subjective fever but no measured elevated temperature. Mild right ear fullness that comes and goes and some tenderness in the left neck. Denies chills, night sweats, nasal congestion, cough, shortness of breath, chest pain, abdominal pain, nausea, nor rash. Past medical history significant for thrush for which she only used nystatin swish and swallow for 2 days.   Review Of Systems Outlined In HPI  No past medical history on file.   Family History  Problem Relation Age of Onset  . Diabetes Father   . Hypertension Father      History  Substance Use Topics  . Smoking status: Never Smoker   . Smokeless tobacco: Never Used  . Alcohol Use: 3.6 oz/week    6 Cans of beer per week     Objective: Filed Vitals:   06/10/12 1443  BP: 110/78  Pulse: 105  Temp: 98.9 F (37.2 C)    General: Alert and Oriented, No Acute Distress HEENT: Pupils equal, round, reactive to light. Conjunctivae clear.  External ears unremarkable, canals clear with intact TMs with appropriate landmarks.  Middle ear appears open without effusion. Pink inferior turbinates.  White film involving the majority of the tongue, Moist mucous membranes, moderate petechiae scattered on the soft palate no other lesions of the pharynx.  Neck supple without palpable lymphadenopathy nor abnormal masses. Lungs: Clear to auscultation bilaterally, no wheezing/ronchi/rales.  Comfortable work of breathing. Good air movement. Cardiac: Regular rate and rhythm. Normal S1/S2.  No murmurs, rubs, nor gallops.    Assessment & Plan: Ashley Watson was seen today for sore throat, chills and otalgia.  Diagnoses  and associated orders for this visit:  Sore throat - POCT rapid strep A - fluconazole (DIFLUCAN) 100 MG tablet; After penicillin finished, two tablets at once on day one, then one daily for a total of seven days. - Throat culture  Dysphagia  Thrush, oral - fluconazole (DIFLUCAN) 100 MG tablet; After penicillin finished, two tablets at once on day one, then one daily for a total of seven days.  Strep pharyngitis - penicillin v potassium (VEETID) 500 MG tablet; One by mouth every 12 hours for ten days, take 1 hour before or 2 hours after meals.    Although rapid strep negative, soft palate petechiae have a highly suspicious for strep pharyngitis will treat with penicillin. I believe she also has a component of not completely treated thrush, like her to use Diflucan after antibiotic regimen above. Asked her to get a new toothbrush, treat discomfort symptomatically with saltwater gargles and nonsteroidal anti-inflammatories  Return if symptoms worsen or fail to improve.

## 2012-06-13 LAB — CULTURE, GROUP A STREP: Organism ID, Bacteria: NORMAL

## 2012-07-09 ENCOUNTER — Ambulatory Visit: Payer: BC Managed Care – PPO | Admitting: *Deleted

## 2012-07-28 ENCOUNTER — Encounter: Payer: Self-pay | Admitting: *Deleted

## 2012-07-28 ENCOUNTER — Emergency Department
Admission: EM | Admit: 2012-07-28 | Discharge: 2012-07-28 | Disposition: A | Discharge status: Home / Self Care | Payer: BC Managed Care – PPO | Source: Home / Self Care | Attending: Emergency Medicine | Admitting: Emergency Medicine

## 2012-07-28 DIAGNOSIS — J069 Acute upper respiratory infection, unspecified: Secondary | ICD-10-CM

## 2012-07-28 DIAGNOSIS — B370 Candidal stomatitis: Secondary | ICD-10-CM

## 2012-07-28 MED ORDER — FLUCONAZOLE 150 MG PO TABS: ORAL_TABLET | ORAL | Status: DC

## 2012-07-28 NOTE — ED Notes (Signed)
Patient given a excuse note for work wrote in for yesterdays date 07/27/12.

## 2012-07-28 NOTE — ED Notes (Signed)
Patient c/o thrush, cough, congestion x 3 days. States is on Ax for thrush but it is not helping. Needs a work note as well.

## 2012-07-29 NOTE — ED Provider Notes (Signed)
History     CSN: [id number]  Arrival date & time 07/28/12  1159   First MD Initiated Contact with Patient 07/28/12 1313      Chief Complaint  Patient presents with  . Thrush  . Cough     HPI Ashley Watson is here for 2 reasons. First, complains of thrush, white coating in mouth for the past couple weeks. She's had bouts of this over the past few months, treated with nystatin oral rinse which helped a little, then a seven-day course of by mouth Diflucan in early November, which seemed to help but fairly well, but never resolved. She's been on antibiotics for strep last month. She states she also is on doxycycline in the past couple months per her gynecologist.  She denies any history of immunosuppression.  Second chief complaint or URI symptoms for the past week, significantly improved over the past couple days.  URI HISTORY  Ashley Watson is a 21 y.o. female who complains of onset of cold symptoms for 7 days.  Have been using over-the-counter treatment which has helped significantly.  No chills/sweats Had a fever last week, that's resolved  Had Nasal congestion, but that significantly improved No Discolored Post-nasal drainage No sinus pain/pressure No sore throat  Had cough, no much improved over the past couple days. No wheezing No chest congestion No hemoptysis No shortness of breath No pleuritic pain  No itchy/red eyes No earache  A few days ago, had some nausea and vomiting x1, but that's resolved. Tolerating by mouth as well now. No abdominal pain No diarrhea  No skin rashes +  Fatigue No myalgias No headache    She denies dysuria, hematuria, or flank pain. History reviewed. No pertinent past medical history.  History reviewed. No pertinent past surgical history.  Family History  Problem Relation Age of Onset  . Diabetes Father   . Hypertension Father     History  Substance Use Topics  . Smoking status: Never Smoker   . Smokeless tobacco: Never Used    . Alcohol Use: 3.6 oz/week    6 Cans of beer per week    OB History    Grav Para Term Preterm Abortions TAB SAB Ect Mult Living   0 0 0 0 0 0 0 0 0 0       Review of Systems  All other systems reviewed and are negative.   See above ROS in HPI Allergies  Review of patient's allergies indicates no known allergies.  Home Medications   Current Outpatient Rx  Name  Route  Sig  Dispense  Refill  . FLUCONAZOLE 100 MG PO TABS      After penicillin finished, two tablets at once on day one, then one daily for a total of seven days.   8 tablet   0   . FLUCONAZOLE 150 MG PO TABS      Take 2 by mouth now, then (starting tomorrow), 1 daily for a total of 14 days treatment.-For thrush.   15 tablet   0     BP 127/82  Pulse 81  Temp 98 F (36.7 C) (Oral)  Resp 16  Ht 5\' 5"  (1.651 m)  Wt 108 lb (48.988 kg)  BMI 17.97 kg/m2  SpO2 99%  Physical Exam  Nursing note and vitals reviewed. Constitutional: She is oriented to person, place, and time. She appears well-developed and well-nourished. No distress.  HENT:  Head: Normocephalic.  Right Ear: External ear normal.  Left Ear: External ear normal.  Nose:  Nose normal.  Mouth/Throat: Oropharyngeal exudate present. No posterior oropharyngeal edema or posterior oropharyngeal erythema.       Whitish exudate buccal mucosa bilaterally. Tongue has a whitish brown coating. Otherwise, oropharynx within normal limits, without any ulcerations or lesions.  Neck: Neck supple.  Cardiovascular: Regular rhythm and normal heart sounds.   No murmur heard. Pulmonary/Chest: Effort normal and breath sounds normal. No respiratory distress. She has no wheezes. She has no rales.  Abdominal: Soft. There is no hepatosplenomegaly. There is no tenderness.  Musculoskeletal: She exhibits no edema and no tenderness.  Lymphadenopathy:    She has no cervical adenopathy.  Neurological: She is alert and oriented to person, place, and time.  Skin: Skin is  warm and dry. No rash noted.  Psychiatric: She has a normal mood and affect.    ED Course  Procedures (including critical care time)  Labs Reviewed - No data to display No results found.   1. Oral thrush   2. Viral URI       MDM  #1  Recurrent oral thrush, possibly secondary to recent antibiotics. We discussed at length. We'll treat with a more aggressive regimen of  Diflucan: 150 mg,-2 by mouth stat, then 1 daily for a total of 14 days. Other measures discussed. Advise follow up with PCP or ENT if no better 2 weeks.  #2 Likely had viral URI over the past week but that's essentially resolved. No particular treatment for this. At her request, work note written.        Lajean Manes, MD 07/29/12 [phone]

## 2012-10-15 ENCOUNTER — Encounter: Payer: Self-pay | Admitting: Sports Medicine

## 2012-10-15 ENCOUNTER — Ambulatory Visit (INDEPENDENT_AMBULATORY_CARE_PROVIDER_SITE_OTHER): Payer: BC Managed Care – PPO

## 2012-10-15 ENCOUNTER — Ambulatory Visit (INDEPENDENT_AMBULATORY_CARE_PROVIDER_SITE_OTHER): Payer: BC Managed Care – PPO | Admitting: Sports Medicine

## 2012-10-15 VITALS — BP 122/78 | HR 96 | Temp 98.8°F | Wt 112.0 lb

## 2012-10-15 DIAGNOSIS — G43009 Migraine without aura, not intractable, without status migrainosus: Secondary | ICD-10-CM

## 2012-10-15 DIAGNOSIS — K589 Irritable bowel syndrome without diarrhea: Secondary | ICD-10-CM

## 2012-10-15 DIAGNOSIS — R197 Diarrhea, unspecified: Secondary | ICD-10-CM

## 2012-10-15 HISTORY — DX: Irritable bowel syndrome, unspecified: K58.9

## 2012-10-15 MED ORDER — SENNOSIDES-DOCUSATE SODIUM 8.6-50 MG PO TABS: 2.0000 | ORAL_TABLET | Freq: Two times a day (BID) | ORAL | Status: DC

## 2012-10-15 MED ORDER — LUBIPROSTONE 8 MCG PO CAPS: 8.0000 ug | ORAL_CAPSULE | Freq: Two times a day (BID) | ORAL | Status: DC

## 2012-10-15 MED ORDER — HYOSCYAMINE SULFATE 0.125 MG PO TABS: 0.1250 mg | ORAL_TABLET | ORAL | Status: DC | PRN

## 2012-10-15 NOTE — Patient Instructions (Addendum)
Irritable Bowel Syndrome  Irritable Bowel Syndrome (IBS) is caused by a disturbance of normal bowel function. Other terms used are spastic colon, mucous colitis, and irritable colon. It does not require surgery, nor does it lead to cancer. There is no cure for IBS. But with proper diet, stress reduction, and medication, you will find that your problems (symptoms) will gradually disappear or improve. IBS is a common digestive disorder. It usually appears in late adolescence or early adulthood. Women develop it twice as often as men.  CAUSES   After food has been digested and absorbed in the small intestine, waste material is moved into the colon (large intestine). In the colon, water and salts are absorbed from the undigested products coming from the small intestine. The remaining residue, or fecal material, is held for elimination. Under normal circumstances, gentle, rhythmic contractions on the bowel walls push the fecal material along the colon towards the rectum. In IBS, however, these contractions are irregular and poorly coordinated. The fecal material is either retained too long, resulting in constipation, or expelled too soon, producing diarrhea.  SYMPTOMS   The most common symptom of IBS is pain. It is typically in the lower left side of the belly (abdomen). But it may occur anywhere in the abdomen. It can be felt as heartburn, backache, or even as a dull pain in the arms or shoulders. The pain comes from excessive bowel-muscle spasms and from the buildup of gas and fecal material in the colon. This pain:   Can range from sharp belly (abdominal) cramps to a dull, continuous ache.   Usually worsens soon after eating.   Is typically relieved by having a bowel movement or passing gas.  Abdominal pain is usually accompanied by constipation. But it may also produce diarrhea. The diarrhea typically occurs right after a meal or upon arising in the morning. The stools are typically soft and watery. They are often  flecked with secretions (mucus).  Other symptoms of IBS include:   Bloating.   Loss of appetite.   Heartburn.   Feeling sick to your stomach (nausea).   Belching   Vomiting   Gas.  IBS may also cause a number of symptoms that are unrelated to the digestive system:   Fatigue.   Headaches.   Anxiety   Shortness of breath   Difficulty in concentrating.   Dizziness.  These symptoms tend to come and go.  DIAGNOSIS   The symptoms of IBS closely mimic the symptoms of other, more serious digestive disorders. So your caregiver may wish to perform a variety of additional tests to exclude these disorders. He/she wants to be certain of learning what is wrong (diagnosis). The nature and purpose of each test will be explained to you.  TREATMENT  A number of medications are available to help correct bowel function and/or relieve bowel spasms and abdominal pain. Among the drugs available are:   Mild, non-irritating laxatives for severe constipation and to help restore normal bowel habits.   Specific anti-diarrheal medications to treat severe or prolonged diarrhea.   Anti-spasmodic agents to relieve intestinal cramps.   Your caregiver may also decide to treat you with a mild tranquilizer or sedative during unusually stressful periods in your life.  The important thing to remember is that if any drug is prescribed for you, make sure that you take it exactly as directed. Make sure that your caregiver knows how well it worked for you.  HOME CARE INSTRUCTIONS    Avoid foods that   are high in fat or oils. Some examples are:heavy cream, butter, frankfurters, sausage, and other fatty meats.   Avoid foods that have a laxative effect, such as fruit, fruit juice, and dairy products.   Cut out carbonated drinks, chewing gum, and "gassy" foods, such as beans and cabbage. This may help relieve bloating and belching.   Bran taken with plenty of liquids may help relieve constipation.   Keep track of what foods seem to trigger  your symptoms.   Avoid emotionally charged situations or circumstances that produce anxiety.   Start or continue exercising.   Get plenty of rest and sleep.  MAKE SURE YOU:    Understand these instructions.   Will watch your condition.   Will get help right away if you are not doing well or get worse.  Document Released: 07/17/2005 Document Revised: 10/09/2011 Document Reviewed: 03/06/2008  [employer] Patient Information 2013 [employer].

## 2012-10-15 NOTE — Assessment & Plan Note (Signed)
Currently sounds to be constipation predominant. I am going to give her Amitiza as well as some hyoscyamine for diarrhea predominant episodes. Abdominal x-ray. I have low suspicion right now for depression. Return in one month.

## 2012-10-15 NOTE — Progress Notes (Signed)
Subjective:    CC: Abdominal pain  HPI: This is a pleasant 22 year-old woman who presents with episodic, "clenching" abdominal pain for 5 months. It occurs sporadically in bilateral lower quandrants and reaches 6-7/10 in severity at its worst. She typically has bowel movements every 2 days, which are hard and occasionally difficult to pass. Her abdominal pain improves with defecation. Her appetite is "very good" and denies nausea and vomiting.  She is sexually active with men and uses condoms regularly. Her last menstrual period was last week and was normal. Her abdominal pain does not correlate with her cycle.  She also complains of difficulty getting to sleep, often not until 2-3 a.m. She finds that she becomes more irritated by small things. She denies low mood and anhedonia, saying that she still very much enjoys hobbies such as drawing.  Review of Systems: She denies fever, chills, night sweats, weight loss, dysuria, vaginal discharge.  Past medical history, Surgical history, Family history not pertinant except as noted below, Social history, Allergies, and medications have been entered into the medical record, reviewed, and no changes needed.   Objective:    General: Well Developed, well nourished, and in no acute distress.  Neuro: Alert and oriented x3, extra-ocular muscles intact, sensation grossly intact.  HEENT: Normocephalic, atraumatic, pupils equal round reactive to light, neck supple, no masses, no lymphadenopathy, thyroid nonpalpable; small whitish plaques noted on buccal mucosa Skin: Warm and dry, no rashes. Cardiac: Regular rate and rhythm, no murmurs rubs or gallops.  Respiratory: Clear to auscultation bilaterally. Not using accessory muscles, speaking in full sentences. Abdominal: soft, nondistended; mild tenderness in LLQ; normoactive bowel sounds, no CVA tenderness.  Impression and Recommendations:    I was present for all essential parts of this visit and  procedure. Ihor Austin. Benjamin Stain, M.D.

## 2012-11-12 ENCOUNTER — Ambulatory Visit: Payer: BC Managed Care – PPO | Admitting: Sports Medicine

## 2012-11-12 DIAGNOSIS — Z0289 Encounter for other administrative examinations: Secondary | ICD-10-CM

## 2012-11-14 ENCOUNTER — Emergency Department: Admission: EM | Admit: 2012-11-14 | Discharge: 2012-11-14 | Payer: BC Managed Care – PPO | Source: Home / Self Care

## 2012-11-15 ENCOUNTER — Encounter: Payer: Self-pay | Admitting: Emergency Medicine

## 2012-11-15 ENCOUNTER — Emergency Department (INDEPENDENT_AMBULATORY_CARE_PROVIDER_SITE_OTHER): Payer: BC Managed Care – PPO

## 2012-11-15 ENCOUNTER — Emergency Department
Admission: EM | Admit: 2012-11-15 | Discharge: 2012-11-15 | Disposition: A | Discharge status: Home / Self Care | Payer: BC Managed Care – PPO | Source: Home / Self Care | Attending: Family Medicine | Admitting: Family Medicine

## 2012-11-15 DIAGNOSIS — M545 Low back pain: Secondary | ICD-10-CM

## 2012-11-15 DIAGNOSIS — M533 Sacrococcygeal disorders, not elsewhere classified: Secondary | ICD-10-CM

## 2012-11-15 MED ORDER — MELOXICAM 7.5 MG PO TABS: 7.5000 mg | ORAL_TABLET | Freq: Every day | ORAL | Status: DC

## 2012-11-15 MED ORDER — CYCLOBENZAPRINE HCL 10 MG PO TABS: ORAL_TABLET | ORAL | Status: DC

## 2012-11-15 NOTE — ED Notes (Signed)
Reports back pain spasms since yesterday. No OTCs; did take Zanax.

## 2012-11-15 NOTE — ED Notes (Signed)
Pharmacy in [place] called reporting patient expected to have scripts waiting at pharmacy.  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  Also noted scripts printed.  Patient brought papers into pharmacy and pharmacist recognized scripts in packet.

## 2012-11-15 NOTE — ED Provider Notes (Signed)
History     CSN: [id number]  Arrival date & time 11/15/12  1627   First MD Initiated Contact with Patient 11/15/12 1641      Chief Complaint  Patient presents with  . Back Pain       HPI Comments: Patient complains of recurring painful "knots" in her lower back for several months.  Yesterday she developed increased pain and stiffness.  She intermittently has pins and needles sensation in her right leg.  She has been sleeping poorly because of the pain.  No recent injury.  No bowel or bladder dysfunction.  No saddle numbness.  She reports no improvement after taking Xanax.  Patient is a 22 y.o. female presenting with back pain. The history is provided by the patient.  Back Pain Location:  Sacro-iliac joint Quality:  Aching Radiates to:  L posterior upper leg Pain severity:  Moderate Pain is:  Same all the time Onset quality:  Gradual Duration:  3 months Timing:  Intermittent Progression:  Worsening Chronicity:  Recurrent Context: lifting heavy objects   Relieved by:  Nothing Worsened by:  Movement Ineffective treatments: Xanax. Associated symptoms: headaches, leg pain and paresthesias   Associated symptoms: no abdominal pain, no bladder incontinence, no bowel incontinence, no dysuria, no fever, no numbness, no pelvic pain, no perianal numbness, no weakness and no weight loss     History reviewed. No pertinent past medical history.  History reviewed. No pertinent past surgical history.  Family History  Problem Relation Age of Onset  . Diabetes Father   . Hypertension Father     History  Substance Use Topics  . Smoking status: Never Smoker   . Smokeless tobacco: Never Used  . Alcohol Use: 3.6 oz/week    6 Cans of beer per week    OB History   Grav Para Term Preterm Abortions TAB SAB Ect Mult Living   0 0 0 0 0 0 0 0 0 0       Review of Systems  Constitutional: Negative for fever and weight loss.  Gastrointestinal: Negative for abdominal pain and bowel  incontinence.  Genitourinary: Negative for bladder incontinence, dysuria and pelvic pain.  Musculoskeletal: Positive for back pain.  Neurological: Positive for headaches and paresthesias. Negative for weakness and numbness.    Allergies  Review of patient's allergies indicates no known allergies.  Home Medications   Current Outpatient Rx  Name  Route  Sig  Dispense  Refill  . cyclobenzaprine (FLEXERIL) 10 MG tablet      Take one tab by mouth at bedtime for muscle spasm   15 tablet   1   . hyoscyamine (LEVSIN, ANASPAZ) 0.125 MG tablet   Oral   Take 1 tablet (0.125 mg total) by mouth every 4 (four) hours as needed for cramping or diarrhea or loose stools.   90 tablet   3   . lubiprostone (AMITIZA) 8 MCG capsule   Oral   Take 1 capsule (8 mcg total) by mouth 2 (two) times daily with a meal.   24 capsule   0   . meloxicam (MOBIC) 7.5 MG tablet   Oral   Take 1 tablet (7.5 mg total) by mouth daily. Take in evening with food   15 tablet   1   . senna-docusate (SENOKOT-S) 8.6-50 MG per tablet   Oral   Take 2 tablets by mouth 2 (two) times daily. Until stooling regularly   60 tablet   0     BP 109/73  Pulse  84  Temp(Src) 98.2 F (36.8 C) (Oral)  Resp 16  Ht 5\' 5"  (1.651 m)  Wt 112 lb (50.803 kg)  BMI 18.64 kg/m2  SpO2 100%  LMP 11/13/2012  Physical Exam Nursing notes and Vital Signs reviewed. Appearance:  Patient appears healthy, stated age, and in no acute distress Eyes:  Pupils are equal, round, and reactive to light and accomodation.  Extraocular movement is intact.  Conjunctivae are not inflamed  Pharynx:  Normal Neck:  Supple.  No adenopathy Lungs:  Clear to auscultation.  Breath sounds are equal.  Heart:  Regular rate and rhythm without murmurs, rubs, or gallops.  Abdomen:  Nontender without masses or hepatosplenomegaly.  Bowel sounds are present.  No CVA or flank tenderness.  Extremities:  No edema.  No calf tenderness.  No tenderness over greater  trochanters of hips.  No obvious leg length discrepancy by inspection.  No increase in tenderness with external hip rotation. Skin:  No rash present.  Back:  Full range of motion.  Can heel/toe walk and squat without difficulty.  There is bilateral paraspinous tenderness from mid-thorax to sacrum, with bilateral tenderness over SI joints.   Straight leg raising test is negative.  Sitting knee extension test is negative.  Strength and sensation in the lower extremities is normal.  Patellar and achilles reflexes are normal    ED Course  Procedures  none   Dg Lumbar Spine Complete  11/15/2012  *RADIOLOGY REPORT*  Clinical Data: Persistent, worsening low back pain.  LUMBAR SPINE - COMPLETE 4+ VIEW  Comparison: 10/15/2012 abdominal images  Findings: There are five lumbar-type vertebral bodies.  No fracture or malalignment.  Disc spaces well maintained.  SI joints are symmetric.  IMPRESSION: Normal exam.   Original Report Authenticated By: Charlett Nose, M.D.      1. Sacroiliac joint pain   2. Low back pain       MDM  Begin Mobic 7.5mg  daily and Flexeril 10mg  at bedtime. Apply ice pack 2 or 3 times daily for about 20 minutes.  Begin range of motion and stretching exercises as per instruction sheet ([facility] information and instruction handout given)  Followup with Dr. Rodney Langton in one week.        Lattie Haw, MD 11/18/12 [phone]

## 2012-11-18 ENCOUNTER — Telehealth: Payer: Self-pay | Admitting: *Deleted

## 2012-11-25 ENCOUNTER — Encounter: Payer: Self-pay | Admitting: Sports Medicine

## 2012-11-25 ENCOUNTER — Ambulatory Visit (INDEPENDENT_AMBULATORY_CARE_PROVIDER_SITE_OTHER): Payer: BC Managed Care – PPO | Admitting: Sports Medicine

## 2012-11-25 VITALS — BP 115/75 | HR 104 | Wt 106.0 lb

## 2012-11-25 DIAGNOSIS — M545 Low back pain, unspecified: Secondary | ICD-10-CM

## 2012-11-25 MED ORDER — ORPHENADRINE CITRATE ER 100 MG PO TB12: 100.0000 mg | ORAL_TABLET | Freq: Two times a day (BID) | ORAL | Status: DC

## 2012-11-25 NOTE — Assessment & Plan Note (Signed)
Mild and likely related to muscle strain since she's been working out more. Continue NSAIDs. May stop Flexeril, and am switching this to Norflex. Home exercises given. We talked in detail about exercise prescription. Return in 4 weeks to see how things are going.

## 2012-11-25 NOTE — Progress Notes (Signed)
   Subjective:    I'm seeing this patient as a consultation for:  Dr. Cathren Harsh  CC: Back pain  HPI: This is a very pleasant 22 year old female, she comes in with a several week history of pain which he localizes in both paralumbar muscles, further down on the left side, in the mid paralumbar muscle on the right side. Pain is worse with extension, she does endorse that she's been using the back extensor machine at the gym more often lately. Pain is not worse with Valsalva, and she denies any radicular symptoms. Patient denies any bowel or bladder changes. Pain is mild to moderate. She was given Flexeril and Mobic, Mobic is helping significantly however unfortunately Flexeril is just making her drowsy.    Past medical history, Surgical history, Family history not pertinant except as noted below, Social history, Allergies, and medications have been entered into the medical record, reviewed, and no changes needed.   Review of Systems: No headache, visual changes, nausea, vomiting, diarrhea, constipation, dizziness, abdominal pain, skin rash, fevers, chills, night sweats, weight loss, swollen lymph nodes, body aches, joint swelling, muscle aches, chest pain, shortness of breath, mood changes, visual or auditory hallucinations.   Objective:   General: Well Developed, well nourished, and in no acute distress.  Neuro/Psych: Alert and oriented x3, extra-ocular muscles intact, able to move all 4 extremities, sensation grossly intact. Skin: Warm and dry, no rashes noted.  Respiratory: Not using accessory muscles, speaking in full sentences, trachea midline.  Cardiovascular: Pulses palpable, no extremity edema. Abdomen: Does not appear distended. Back Exam:  Inspection: Unremarkable  Motion: Flexion 45 deg, Extension 45 deg, Side Bending to 45 deg bilaterally,  Rotation to 45 deg bilaterally  SLR laying: Negative  XSLR laying: Negative  Palpable tenderness: Left paralumbar muscles near the posterior  superior iliac spine, right mid lumbar paraspinal musculature. FABER: negative. Sensory change: Gross sensation intact to all lumbar and sacral dermatomes.  Reflexes: 2+ at both patellar tendons, 2+ at achilles tendons, Babinski's downgoing.  Strength at foot  Plantar-flexion: 5/5 Dorsi-flexion: 5/5 Eversion: 5/5 Inversion: 5/5  Leg strength  Quad: 5/5 Hamstring: 5/5 Hip flexor: 5/5 Hip abductors: 5/5  Gait unremarkable.  Impression and Recommendations:   This case required medical decision making of moderate complexity.

## 2012-12-24 ENCOUNTER — Ambulatory Visit: Payer: BC Managed Care – PPO | Admitting: Sports Medicine

## 2012-12-24 DIAGNOSIS — Z0289 Encounter for other administrative examinations: Secondary | ICD-10-CM

## 2012-12-25 ENCOUNTER — Ambulatory Visit: Payer: BC Managed Care – PPO | Admitting: Sports Medicine

## 2013-02-06 ENCOUNTER — Ambulatory Visit (INDEPENDENT_AMBULATORY_CARE_PROVIDER_SITE_OTHER): Payer: BC Managed Care – PPO | Admitting: Obstetrics & Gynecology

## 2013-02-06 ENCOUNTER — Encounter: Payer: Self-pay | Admitting: Obstetrics & Gynecology

## 2013-02-06 VITALS — BP 110/70 | HR 100 | Resp 14 | Ht 65.0 in | Wt 101.0 lb

## 2013-02-06 DIAGNOSIS — L293 Anogenital pruritus, unspecified: Secondary | ICD-10-CM

## 2013-02-06 DIAGNOSIS — N898 Other specified noninflammatory disorders of vagina: Secondary | ICD-10-CM

## 2013-02-06 NOTE — Progress Notes (Signed)
  Subjective:    Patient ID: Ashley Watson, female    DOB: 1991-03-17, 22 y.o.   MRN: [id number]  HPI  22 yo SW G0 who is here today with a vaginal itch of several day's duration. She has no other complaints. She is not currently sexually active and denies any new partner since her annual/STI testing here 10/13.  Review of Systems     Objective:   Physical Exam  Vaginal discharge, thin, white, c/w BV       Assessment & Plan:   Vaginal itch- I sent Aptima and cervical cultures I will treat according to the results.

## 2013-02-07 LAB — GC/CHLAMYDIA PROBE AMP: GC Probe RNA: NEGATIVE

## 2013-02-10 ENCOUNTER — Telehealth: Payer: Self-pay | Admitting: *Deleted

## 2013-02-10 DIAGNOSIS — N760 Acute vaginitis: Secondary | ICD-10-CM

## 2013-02-10 MED ORDER — METRONIDAZOLE 500 MG PO TABS: 500.0000 mg | ORAL_TABLET | Freq: Two times a day (BID) | ORAL | Status: DC

## 2013-02-10 NOTE — Telephone Encounter (Signed)
Pt notified of positive Gardnerella and RX sent to [facility] for Flagyl 500 mg BID x 7 days.

## 2013-02-26 ENCOUNTER — Ambulatory Visit (INDEPENDENT_AMBULATORY_CARE_PROVIDER_SITE_OTHER): Payer: BC Managed Care – PPO | Admitting: Physician Assistant

## 2013-02-26 ENCOUNTER — Telehealth: Payer: Self-pay | Admitting: *Deleted

## 2013-02-26 ENCOUNTER — Encounter: Payer: Self-pay | Admitting: Physician Assistant

## 2013-02-26 VITALS — BP 121/74 | HR 64 | Wt 101.0 lb

## 2013-02-26 DIAGNOSIS — M545 Low back pain, unspecified: Secondary | ICD-10-CM

## 2013-02-26 DIAGNOSIS — R300 Dysuria: Secondary | ICD-10-CM

## 2013-02-26 DIAGNOSIS — M62838 Other muscle spasm: Secondary | ICD-10-CM

## 2013-02-26 DIAGNOSIS — R109 Unspecified abdominal pain: Secondary | ICD-10-CM

## 2013-02-26 DIAGNOSIS — N390 Urinary tract infection, site not specified: Secondary | ICD-10-CM

## 2013-02-26 LAB — POCT URINALYSIS DIPSTICK
Glucose, UA: NEGATIVE
Nitrite, UA: POSITIVE
Urobilinogen, UA: 1

## 2013-02-26 MED ORDER — CYCLOBENZAPRINE HCL 7.5 MG PO TABS: 7.5000 mg | ORAL_TABLET | Freq: Every day | ORAL | Status: DC | PRN

## 2013-02-26 MED ORDER — CYCLOBENZAPRINE HCL 5 MG PO TABS: ORAL_TABLET | ORAL | Status: DC

## 2013-02-26 MED ORDER — SULFAMETHOXAZOLE-TRIMETHOPRIM 800-160 MG PO TABS: 1.0000 | ORAL_TABLET | Freq: Two times a day (BID) | ORAL | Status: DC

## 2013-02-26 NOTE — Patient Instructions (Addendum)
Urinary Tract Infection  Urinary tract infections (UTIs) can develop anywhere along your urinary tract. Your urinary tract is your body's drainage system for removing wastes and extra water. Your urinary tract includes two kidneys, two ureters, a bladder, and a urethra. Your kidneys are a pair of bean-shaped organs. Each kidney is about the size of your fist. They are located below your ribs, one on each side of your spine.  CAUSES  Infections are caused by microbes, which are microscopic organisms, including fungi, viruses, and bacteria. These organisms are so small that they can only be seen through a microscope. Bacteria are the microbes that most commonly cause UTIs.  SYMPTOMS   Symptoms of UTIs may vary by age and gender of the patient and by the location of the infection. Symptoms in young women typically include a frequent and intense urge to urinate and a painful, burning feeling in the bladder or urethra during urination. Older women and men are more likely to be tired, shaky, and weak and have muscle aches and abdominal pain. A fever may mean the infection is in your kidneys. Other symptoms of a kidney infection include pain in your back or sides below the ribs, nausea, and vomiting.  DIAGNOSIS  To diagnose a UTI, your caregiver will ask you about your symptoms. Your caregiver also will ask to provide a urine sample. The urine sample will be tested for bacteria and white blood cells. White blood cells are made by your body to help fight infection.  TREATMENT   Typically, UTIs can be treated with medication. Because most UTIs are caused by a bacterial infection, they usually can be treated with the use of antibiotics. The choice of antibiotic and length of treatment depend on your symptoms and the type of bacteria causing your infection.  HOME CARE INSTRUCTIONS   If you were prescribed antibiotics, take them exactly as your caregiver instructs you. Finish the medication even if you feel better after you  have only taken some of the medication.   Drink enough water and fluids to keep your urine clear or pale yellow.   Avoid caffeine, tea, and carbonated beverages. They tend to irritate your bladder.   Empty your bladder often. Avoid holding urine for long periods of time.   Empty your bladder before and after sexual intercourse.   After a bowel movement, women should cleanse from front to back. Use each tissue only once.  SEEK MEDICAL CARE IF:    You have back pain.   You develop a fever.   Your symptoms do not begin to resolve within 3 days.  SEEK IMMEDIATE MEDICAL CARE IF:    You have severe back pain or lower abdominal pain.   You develop chills.   You have nausea or vomiting.   You have continued burning or discomfort with urination.  MAKE SURE YOU:    Understand these instructions.   Will watch your condition.   Will get help right away if you are not doing well or get worse.  Document Released: 04/26/2005 Document Revised: 01/16/2012 Document Reviewed: 08/25/2011  [employer] Patient Information 2014 [employer].

## 2013-02-26 NOTE — Telephone Encounter (Signed)
Rx sent for the flexeril 7.5mg  was 28.00 and the 5mg  was was .98cents and 10mg  was .72 cents. Per Ashley Watson switch to Flexeril 5mg  take 1-2 tabs daily as needed for muscle spasms.

## 2013-02-26 NOTE — Progress Notes (Signed)
  Subjective:    Patient ID: Ashley Watson, female    DOB: 03/18/1991, 22 y.o.   MRN: [id number]  HPI Patient is a 22 yo female who presents to the clinic after waking up this morning and it was painful. She has noticed urine color changes for last day or so. This morning she felt some tingling in her lower abdomin. She denies any fever, chills, muscle aches, SOB, n/v/d. She has not noticed her frequency or urgency have changed. She has not done anything to make better. She does not have a history of UTI.   She would also like to go back on flexeril for muscle spasm. She reports that norflex does not help at all. Flexeril worked the best but does not feel like 5mg  helped that much. Would like to see if can increase. She only uses as needed not everyday.     Review of Systems     Objective:   Physical Exam  Constitutional: She is oriented to person, place, and time. She appears well-developed and well-nourished.  HENT:  Head: Normocephalic and atraumatic.  Cardiovascular: Normal rate, regular rhythm and normal heart sounds.   Pulmonary/Chest: Effort normal and breath sounds normal.  Mild tenderness in the CVA region on left side only.  Abdominal: Soft. Bowel sounds are normal. She exhibits no distension and no mass. There is no tenderness. There is no rebound and no guarding.  Neurological: She is alert and oriented to person, place, and time.  Skin: Skin is warm and dry.  Psychiatric: She has a normal mood and affect. Her behavior is normal.          Assessment & Plan:  UTI/dysuria- See results of urine dipstick.   Value    Color, UA purple     Clarity, UA cloudy     Glucose, UA neg     Bilirubin, UA moderate     Ketones, UA trace     Spec Grav, UA 1.020     Blood, UA large     pH, UA 6.5     Protein, UA >=300     Urobilinogen, UA 1.0     Nitrite, UA positive     Leukocytes, UA large (3+)      Put patient on Bactrim for 7 days. Discussed to drink more water. Gave  symptomatic care in form of handout. Request pt come back in 7 days to recheck urine since a lot of abnormalities. Discussed with pt if symptoms not improving or spikes fever please call office. Sent for culture for sensitivity.   Low back pain/muscle spasms- Pt reported that norflex did not work and flexeril did but 5mg  did not always work. Sent 7.5mg  Flexeril to pharmacy to try as needed. Pt called back and due to cost wanted 10mg . I sent flexeril 5mg  1-2 tabs as needed for muscle spasms. Pt to follow up with Dr. Yehuda Budd.

## 2013-02-28 LAB — URINE CULTURE: Colony Count: 10000

## 2013-03-05 ENCOUNTER — Ambulatory Visit: Payer: BC Managed Care – PPO | Admitting: Physician Assistant

## 2013-03-07 ENCOUNTER — Encounter: Payer: Self-pay | Admitting: Physician Assistant

## 2013-03-07 ENCOUNTER — Ambulatory Visit (INDEPENDENT_AMBULATORY_CARE_PROVIDER_SITE_OTHER): Payer: BC Managed Care – PPO | Admitting: Physician Assistant

## 2013-03-07 VITALS — BP 130/78 | HR 95 | Wt 101.0 lb

## 2013-03-07 DIAGNOSIS — N390 Urinary tract infection, site not specified: Secondary | ICD-10-CM

## 2013-03-07 DIAGNOSIS — R809 Proteinuria, unspecified: Secondary | ICD-10-CM

## 2013-03-07 LAB — POCT URINALYSIS DIPSTICK
Blood, UA: NEGATIVE
Nitrite, UA: NEGATIVE
Spec Grav, UA: >=1.03
Urobilinogen, UA: 0.2
pH, UA: 6

## 2013-03-07 NOTE — Progress Notes (Signed)
  Subjective:    Patient ID: Ashley Watson, female    DOB: 1991/06/19, 22 y.o.   MRN: [id number]  HPI Patient presents to the clinic to follow up on UTI last week. She denies any symptoms of urgency, frequency, pain, pressure, fever or chills. She also had some protein in her urine last week that we wanted to follow up on.She feels great today.   Review of Systems     Objective:   Physical Exam  Constitutional: She is oriented to person, place, and time. She appears well-developed and well-nourished.  HENT:  Head: Normocephalic and atraumatic.  Cardiovascular: Normal rate, regular rhythm and normal heart sounds.   Pulmonary/Chest: Effort normal and breath sounds normal.  No CVA tenderness.  Abdominal: Soft. Bowel sounds are normal. There is no tenderness.  Neurological: She is alert and oriented to person, place, and time.  Skin: Skin is warm and dry.  Psychiatric: She has a normal mood and affect. Her behavior is normal.          Assessment & Plan:  UTI recheck- UA negative for blood, protein, leuks, and nitrates. UTI is cleared. Pt reassured that protein was result of infection. Call as needed.

## 2013-04-11 ENCOUNTER — Ambulatory Visit (INDEPENDENT_AMBULATORY_CARE_PROVIDER_SITE_OTHER): Payer: BC Managed Care – PPO | Admitting: Sports Medicine

## 2013-04-11 ENCOUNTER — Emergency Department
Admission: EM | Admit: 2013-04-11 | Discharge: 2013-04-11 | Disposition: A | Discharge status: Home / Self Care | Payer: BC Managed Care – PPO | Source: Home / Self Care | Attending: Family Medicine | Admitting: Family Medicine

## 2013-04-11 DIAGNOSIS — J329 Chronic sinusitis, unspecified: Secondary | ICD-10-CM

## 2013-04-11 DIAGNOSIS — J029 Acute pharyngitis, unspecified: Secondary | ICD-10-CM

## 2013-04-11 DIAGNOSIS — G43909 Migraine, unspecified, not intractable, without status migrainosus: Secondary | ICD-10-CM

## 2013-04-11 DIAGNOSIS — J069 Acute upper respiratory infection, unspecified: Secondary | ICD-10-CM

## 2013-04-11 DIAGNOSIS — R634 Abnormal weight loss: Secondary | ICD-10-CM | POA: Insufficient documentation

## 2013-04-11 LAB — POCT CBC W AUTO DIFF (K'VILLE URGENT CARE)

## 2013-04-11 MED ORDER — METHYLPREDNISOLONE SODIUM SUCC 125 MG IJ SOLR: 80.0000 mg | Freq: Once | INTRAMUSCULAR | Status: DC

## 2013-04-11 MED ORDER — METHYLPREDNISOLONE SODIUM SUCC 125 MG IJ SOLR
125.0000 mg | Freq: Once | INTRAMUSCULAR | Status: AC
  Administered 2013-04-11: 125 mg via INTRAMUSCULAR

## 2013-04-11 MED ORDER — METHYLPREDNISOLONE SODIUM SUCC 125 MG IJ SOLR: 125.0000 mg | Freq: Once | INTRAMUSCULAR | Status: DC

## 2013-04-11 NOTE — Assessment & Plan Note (Signed)
On further review of her chart, she is approximately the same weight that she was 6 years ago. She did gain some weight over the past few years, but has lost it again. She does tell us that she is eating appropriately, and she's had a normal CBC, CMET, HIV, hepatitis B., hepatitis C, TSH, T3, T4 levels. She is also up-to-date on all age-appropriate cancer screening. Dr. Alvester Morin is checking a prealbumin as well as serum toxicology screen. I have asked her to see me back in the office next week to further discuss her symptoms, as well as weight. Since I have had a chance to further review her chart, I do not think that her weight loss is pathologic, she has maintained approximately the same weight for 6 years now, she is just constitutionally thin.

## 2013-04-11 NOTE — ED Provider Notes (Addendum)
CSN: [id number]     Arrival date & time 04/11/13  1405 History   First MD Initiated Contact with Patient 04/11/13 1413     Chief Complaint  Patient presents with  . Sore Throat    x 2 days  . Cough    x 2 days    HPI  URI Symptoms Onset: 7-10 days  Description: sinus pressure/pain L >R, post nasal drip cough, severe headache  Modifying factors:  Baseline history of migraines. Patient states she's having a migrainous flare currently. Has had some associated photophobia nausea. Pain is predominantly left-sided with sinus pressure. You she has generalized headache. Has also had some mild dizziness with sinus pressure.  Symptoms Nasal discharge: Minimal Fever: No Sore throat: Yes Cough: Yes Wheezing: No Ear pain: No GI symptoms: No Sick contacts: yes; boyfriend with similar sxs    Red Flags  Stiff neck: No Dyspnea: no Rash: no Swallowing difficulty: no  Sinusitis Risk Factors Headache/face pain: yes Double sickening: no tooth pain: n  Allergy Risk Factors Sneezing: no Itchy scratchy throat: no Seasonal symptoms: no  Flu Risk Factors Headache: yes muscle aches: no severe fatigue: no   History reviewed. No pertinent past medical history. History reviewed. No pertinent past surgical history. Family History  Problem Relation Age of Onset  . Diabetes Father   . Hypertension Father    History  Substance Use Topics  . Smoking status: Never Smoker   . Smokeless tobacco: Never Used  . Alcohol Use: 3.6 oz/week    6 Cans of beer per week   OB History   Grav Para Term Preterm Abortions TAB SAB Ect Mult Living   0 0 0 0 0 0 0 0 0 0      Review of Systems  All other systems reviewed and are negative.    Allergies  Review of patient's allergies indicates no known allergies.  Home Medications   Current Outpatient Rx  Name  Route  Sig  Dispense  Refill  . cyclobenzaprine (FLEXERIL) 5 MG tablet      Taje 1-2 tabs by mouth daily as needed for muscle spasms   30 tablet   1   . hyoscyamine (LEVSIN, ANASPAZ) 0.125 MG tablet   Oral   Take 1 tablet (0.125 mg total) by mouth every 4 (four) hours as needed for cramping or diarrhea or loose stools.   90 tablet   3   . lubiprostone (AMITIZA) 8 MCG capsule   Oral   Take 1 capsule (8 mcg total) by mouth 2 (two) times daily with a meal.   24 capsule   0   . meloxicam (MOBIC) 7.5 MG tablet   Oral   Take 1 tablet (7.5 mg total) by mouth daily. Take in evening with food   15 tablet   1   . metroNIDAZOLE (FLAGYL) 500 MG tablet   Oral   Take 1 tablet (500 mg total) by mouth 2 (two) times daily.   14 tablet   0   . senna-docusate (SENOKOT-S) 8.6-50 MG per tablet   Oral   Take 2 tablets by mouth 2 (two) times daily. Until stooling regularly   60 tablet   0    BP 105/74  Pulse 131  Temp(Src) 98.9 F (37.2 C) (Oral)  Ht 5\' 6"  (1.676 m)  Wt 98 lb (44.453 kg)  BMI 15.83 kg/m2  SpO2 100% Physical Exam  Constitutional: She appears well-developed and well-nourished.  cachectic  HENT:  Head: Normocephalic and atraumatic.  Right Ear: External ear normal.  Left Ear: External ear normal.  +nasal erythema, rhinorrhea bilaterally, + post oropharyngeal erythema    Eyes: Conjunctivae are normal. Pupils are equal, round, and reactive to light.  Neck: Normal range of motion. Neck supple.  No meningismus Kernig and Brudzinski negative  Cardiovascular: Normal rate, regular rhythm and normal heart sounds.   Pulmonary/Chest: Effort normal and breath sounds normal.  Abdominal: Soft.  Musculoskeletal: Normal range of motion.  Lymphadenopathy:    She has no cervical adenopathy.  Neurological: She is alert. No cranial nerve deficit. Coordination normal.  Skin: Skin is warm.    ED Course  Procedures (including critical care time) Labs Review Labs Reviewed  POCT RAPID STREP A (OFFICE) - Normal   Imaging Review No results found.  MDM   1. Sore throat   2. URI (upper respiratory infection)    3. Sinusitis   4. Migraine    Suspect viral sinusitis with secondary migrainous flare. Solu-Medrol for sinusitis and migraine. Patient was noted to have a mild vasovagal episode after administration of Solu-Medrol. Vital signs stable. Patient was initially tachycardic into the 130s on initial intake. This resolved on vital sign recheck. Symptomatically resolved within 3-5 minutes. We'll prescribe azithromycin for infectious coverage. Noted white count of 16.21 CBC today. However, this was after initiation of Solu-Medrol. Unclear if this is early D. margination. Afebrile. Discuss case with patient's PCP as he is aware her baseline. Agrees with current treatment plan. Add on a serum drug screen as well as preop even at PCPs request. Discussed neurovascular red flags at length with both patient and boyfriend. If headache persists despite current treatment regimen, plan for ER evaluation. Patient was a questionable history of seizures versus pseudoseizures in the past. Recommend followup with neurology for this issue. Go to ER if this occurs.    Doree Albee, MD 04/11/13 1511  Doree Albee, MD 04/11/13 [phone]

## 2013-04-11 NOTE — ED Notes (Signed)
Ashley Watson complains of sore throat, dry cough, body aches, facial pain, ear pain, runny nose and headache.

## 2013-04-11 NOTE — Progress Notes (Signed)
  Subjective:    CC: Sick  HPI: Ashley Watson is a very pleasant 22 year old female, she is a patient of mine, she is seen in urgent care today for cough, sore throat, and upper respiratory symptoms. She did have a vagal attack during a shot given in the urgent care, and I was called for assistance in evaluation. She has been continuing to lose weight, but has not kept appropriate followup appointments with me. We have done a fairly extensive laboratory workup. She tells me that she continues to eat appropriately, all the time in her words. She denies any constitutional symptoms other than today.  Past medical history, Surgical history, Family history not pertinant except as noted below, Social history, Allergies, and medications have been entered into the medical record, reviewed, and no changes needed.   Review of Systems: No fevers, chills, night sweats, weight loss, chest pain, or shortness of breath.   Objective:    General: Well Developed, well nourished, and in no acute distress. She does appear somewhat pale, she did just have a vagal response to injection. Neuro: Alert and oriented x3, extra-ocular muscles intact, sensation grossly intact.  HEENT: Normocephalic, atraumatic, pupils equal round reactive to light, neck supple, no masses, no lymphadenopathy, thyroid nonpalpable.  Skin: Warm and dry, no rashes. Cardiac: Regular rate and rhythm, no murmurs rubs or gallops, no lower extremity edema.  Respiratory: Clear to auscultation bilaterally. Not using accessory muscles, speaking in full sentences. Abdomen: Soft, nontender, nondistended, normal bowel sounds, no palpable masses.  We checked a CBC, she did have a high white blood cell count with elevated granulocytes, but her hemoglobin, MCV, and hematocrit were normal.  Impression and Recommendations:

## 2013-04-12 LAB — COMPREHENSIVE METABOLIC PANEL
ALT: 14 U/L (ref 0–35)
CO2: 28 mEq/L (ref 19–32)
Calcium: 9.3 mg/dL (ref 8.4–10.5)
Chloride: 106 mEq/L (ref 96–112)
Glucose, Bld: 112 mg/dL — ABNORMAL HIGH (ref 70–99)
Sodium: 138 mEq/L (ref 135–145)
Total Bilirubin: 0.6 mg/dL (ref 0.3–1.2)
Total Protein: 6.7 g/dL (ref 6.0–8.3)

## 2013-04-14 ENCOUNTER — Telehealth: Payer: Self-pay | Admitting: Emergency Medicine

## 2013-04-16 LAB — DRUG SCREEN PANEL (SERUM)

## 2013-12-29 ENCOUNTER — Encounter: Payer: Self-pay | Admitting: Physician Assistant

## 2013-12-29 ENCOUNTER — Ambulatory Visit (INDEPENDENT_AMBULATORY_CARE_PROVIDER_SITE_OTHER): Payer: BC Managed Care – PPO | Admitting: Physician Assistant

## 2013-12-29 VITALS — BP 131/87 | HR 106 | Ht 66.0 in | Wt 106.0 lb

## 2013-12-29 DIAGNOSIS — L739 Follicular disorder, unspecified: Secondary | ICD-10-CM

## 2013-12-29 DIAGNOSIS — R21 Rash and other nonspecific skin eruption: Secondary | ICD-10-CM

## 2013-12-29 DIAGNOSIS — L678 Other hair color and hair shaft abnormalities: Secondary | ICD-10-CM

## 2013-12-29 DIAGNOSIS — L738 Other specified follicular disorders: Secondary | ICD-10-CM

## 2013-12-29 DIAGNOSIS — R55 Syncope and collapse: Secondary | ICD-10-CM

## 2013-12-29 MED ORDER — CEPHALEXIN 500 MG PO CAPS: 500.0000 mg | ORAL_CAPSULE | Freq: Two times a day (BID) | ORAL | Status: DC

## 2013-12-29 MED ORDER — METHYLPREDNISOLONE SODIUM SUCC 125 MG IJ SOLR
125.0000 mg | Freq: Once | INTRAMUSCULAR | Status: AC
  Administered 2013-12-29: 125 mg via INTRAMUSCULAR

## 2013-12-29 NOTE — Patient Instructions (Signed)
Folliculitis  Folliculitis is redness, soreness, and swelling (inflammation) of the hair follicles. This condition can occur anywhere on the body. People with weakened immune systems, diabetes, or obesity have a greater risk of getting folliculitis. CAUSES  Bacterial infection. This is the most common cause.  Fungal infection.  Viral infection.  Contact with certain chemicals, especially oils and tars. Long-term folliculitis can result from bacteria that live in the nostrils. The bacteria may trigger multiple outbreaks of folliculitis over time. SYMPTOMS Folliculitis most commonly occurs on the scalp, thighs, legs, back, buttocks, and areas where hair is shaved frequently. An early sign of folliculitis is a small, white or yellow, pus-filled, itchy lesion (pustule). These lesions appear on a red, inflamed follicle. They are usually less than 0.2 inches (5 mm) wide. When there is an infection of the follicle that goes deeper, it becomes a boil or furuncle. A group of closely packed boils creates a larger lesion (carbuncle). Carbuncles tend to occur in hairy, sweaty areas of the body. DIAGNOSIS  Your caregiver can usually tell what is wrong by doing a physical exam. A sample may be taken from one of the lesions and tested in a lab. This can help determine what is causing your folliculitis. TREATMENT  Treatment may include:  Applying warm compresses to the affected areas.  Taking antibiotic medicines orally or applying them to the skin.  Draining the lesions if they contain a large amount of pus or fluid.  Laser hair removal for cases of long-lasting folliculitis. This helps to prevent regrowth of the hair. HOME CARE INSTRUCTIONS  Apply warm compresses to the affected areas as directed by your caregiver.  If antibiotics are prescribed, take them as directed. Finish them even if you start to feel better.  You may take over-the-counter medicines to relieve itching.  Do not shave  irritated skin.  Follow up with your caregiver as directed. SEEK IMMEDIATE MEDICAL CARE IF:   You have increasing redness, swelling, or pain in the affected area.  You have a fever. MAKE SURE YOU:  Understand these instructions.  Will watch your condition.  Will get help right away if you are not doing well or get worse. Document Released: 09/25/2001 Document Revised: 01/16/2012 Document Reviewed: 10/17/2011 [employer] Patient Information 2014 [employer].  

## 2013-12-29 NOTE — Progress Notes (Signed)
   Subjective:    Patient ID: Ashley Watson, female    DOB: Nov 11, 1990, 23 y.o.   MRN: [id number]  HPI Pt is a 22 yo female who presents to the clinic with rash over body. Located worst in groin area and over forearms. Not tried anything to make better. No fever, chills, nausea, vomiting, sore throat. No new medications. No new lotions. She does have dogs but they have been treated for fleas. No one else in the house has this rash. They burn and itch.   Pt also would like further work up for [employer]. She has had for 2+ years. She has seen neurologist before but they only diagnosed with migraines. She has been having blackouts more frequently. Lately they have been while she is driving. For 10-15 seconds she just blacks out. Denies passing out. She does not loose consciousness. She denies any migraines in relationship. Her grandpa and grandma both had seizure disorder.     Review of Systems     Objective:   Physical Exam  Constitutional: She is oriented to person, place, and time. She appears well-developed and well-nourished.  HENT:  Head: Normocephalic and atraumatic.  Cardiovascular: Regular rhythm and normal heart sounds.   Tachycardia at 106.   Pulmonary/Chest: Effort normal and breath sounds normal. She has no wheezes.  Neurological: She is alert and oriented to person, place, and time.  Skin:     Psychiatric: She has a normal mood and affect. Her behavior is normal.          Assessment & Plan:  Rash- appears to be folliculitis. Pt admits to hot tub exposure last week. Most irritated lesions are in her groin area. Treated with keflex for 10 days. Due to itching there could be some contact dermatitis. Shot of solu-medrol 125mg  given IM today. Follow up if not improving.   Blackouts- pt has been to neurologist in the past but been many years ago. They have been going on for years.  Will refer to be evaluated again.

## 2013-12-30 DIAGNOSIS — R55 Syncope and collapse: Secondary | ICD-10-CM | POA: Insufficient documentation

## 2014-01-04 ENCOUNTER — Emergency Department
Admission: EM | Admit: 2014-01-04 | Discharge: 2014-01-04 | Disposition: A | Discharge status: Home / Self Care | Payer: BC Managed Care – PPO | Source: Home / Self Care | Attending: Emergency Medicine | Admitting: Emergency Medicine

## 2014-01-04 ENCOUNTER — Encounter: Payer: Self-pay | Admitting: Emergency Medicine

## 2014-01-04 DIAGNOSIS — R21 Rash and other nonspecific skin eruption: Secondary | ICD-10-CM

## 2014-01-04 HISTORY — DX: Unspecified convulsions: R56.9

## 2014-01-04 HISTORY — DX: Migraine, unspecified, not intractable, without status migrainosus: G43.909

## 2014-01-04 MED ORDER — PREDNISONE (PAK) 10 MG PO TABS: ORAL_TABLET | Freq: Every day | ORAL | Status: DC

## 2014-01-04 NOTE — ED Notes (Signed)
Pt c/o rash on legs, arms, abd, groin, under arms, back.  Not on face or upper chest.  Was put on Cephalexin 500mg s bid approx a week ago for what was dx as folliculitis.  Rash has only worsened. States she has been taking med. As prescribed but has 7 days worth left.

## 2014-01-04 NOTE — ED Provider Notes (Signed)
CSN: [id number]     Arrival date & time 01/04/14  1225 History   First MD Initiated Contact with Patient 01/04/14 1233     Chief Complaint  Patient presents with  . Rash   (Consider location/radiation/quality/duration/timing/severity/associated sxs/prior Treatment) HPI This patient complains of a RASH.  And was diagnosed with type of folliculitis recently and was given Keflex but that did not help and the rash is worsening a little bit.  No constitutional symptoms.  Location: whole body, spares face and upper chest  Onset: 2 weeks ago   Course: slightly worsening especially itching Self-treated with: Keflex              Improvement with treatment: no  History Itching: yes  Tenderness: no  New medications/antibiotics: yes, Keflex  Pet exposure: no  Recent travel or tropical exposure: no  New soaps, shampoos, detergent, clothing: no  Tick/insect exposure: no, but [place] starting   Red Flags Feeling ill: no  Fever: no  Facial/tongue swelling/difficulty breathing:  no  Diabetic or immunocompromised: no     Past Medical History  Diagnosis Date  . Migraines   . Seizures    History reviewed. No pertinent past surgical history. Family History  Problem Relation Age of Onset  . Diabetes Father   . Hypertension Father    History  Substance Use Topics  . Smoking status: Never Smoker   . Smokeless tobacco: Never Used  . Alcohol Use: 3.6 oz/week    6 Cans of beer per week     Comment: 2   OB History   Grav Para Term Preterm Abortions TAB SAB Ect Mult Living   0 0 0 0 0 0 0 0 0 0      Review of Systems  All other systems reviewed and are negative.   Allergies  Review of patient's allergies indicates no known allergies.  Home Medications   Prior to Admission medications   Medication Sig Start Date End Date Taking? Authorizing Provider  cephALEXin (KEFLEX) 500 MG capsule Take 1 capsule (500 mg total) by mouth 2 (two) times  daily. For 10 days. 12/29/13   Jade L Breeback, PA-C  cyclobenzaprine (FLEXERIL) 5 MG tablet Taje 1-2 tabs by mouth daily as needed for muscle spasms 02/26/13   Jade L Breeback, PA-C  hyoscyamine (LEVSIN, ANASPAZ) 0.125 MG tablet Take 1 tablet (0.125 mg total) by mouth every 4 (four) hours as needed for cramping or diarrhea or loose stools. 10/15/12   Silverio Decamp, MD  lubiprostone (AMITIZA) 8 MCG capsule Take 1 capsule (8 mcg total) by mouth 2 (two) times daily with a meal. 10/15/12   Silverio Decamp, MD  meloxicam (MOBIC) 7.5 MG tablet Take 1 tablet (7.5 mg total) by mouth daily. Take in evening with food 11/15/12   Kandra Nicolas, MD  predniSONE (STERAPRED UNI-PAK) 10 MG tablet Take by mouth daily. 6 day pack, use as directed 01/04/14   Janeann Forehand, MD  predniSONE (STERAPRED UNI-PAK) 10 MG tablet Take by mouth daily. 6 day pack, use as directed 01/04/14   Janeann Forehand, MD  senna-docusate (SENOKOT-S) 8.6-50 MG per tablet Take 2 tablets by mouth 2 (two) times daily. Until stooling regularly 10/15/12   Silverio Decamp, MD   BP 115/79  Pulse 113  Temp(Src) 98.1 F (36.7 C) (Oral)  Resp 16  Ht 5\' 5"  (1.651 m)  Wt 102 lb 12 oz (46.607 kg)  BMI 17.10 kg/m2  SpO2 99%  LMP 12/30/2013 Physical Exam  Nursing note and vitals reviewed. Constitutional: She is oriented to person, place, and time. She appears well-developed and well-nourished.  HENT:  Head: Normocephalic and atraumatic.  No lesions or spots  Eyes: No scleral icterus.  Neck: Neck supple.  Cardiovascular: Regular rhythm and normal heart sounds.   Pulmonary/Chest: Effort normal and breath sounds normal. No respiratory distress.  Neurological: She is alert and oriented to person, place, and time.  Skin: Skin is warm and dry.     Erythematous small macular papular rash, spares face, spares palms and soles.  No bleeding, blanchable.  Psychiatric: She has a normal mood and affect. Her speech is normal.     ED Course  Procedures (including critical care time) Labs Review Labs Reviewed - No data to display  Imaging Review No results found.   MDM   1. Rash and nonspecific skin eruption    This patient has a nonspecific rash over the lower part of her body.  I am unsure what the diagnosis is and explained that to her.  However I do not believe that this is folliculitis, poison ivy, chickenpox, contact or atopic dermatitis, measles, et Ronney Asters.  Patient has no other symptoms or constitutional symptoms such as fever chill.  If she is not feeling better sooner if more symptoms, she needs blood work done including [employer] spotted fever Lyme' and a CBC.  We'll put her on prednisone temporarily and call her back in a few days to see she is doing.  If not improving by then he is to come back for further evaluation.   Janeann Forehand, MD 01/04/14 [phone]

## 2014-01-12 ENCOUNTER — Encounter: Payer: Self-pay | Admitting: Physician Assistant

## 2014-01-12 ENCOUNTER — Ambulatory Visit (INDEPENDENT_AMBULATORY_CARE_PROVIDER_SITE_OTHER): Payer: BC Managed Care – PPO | Admitting: Physician Assistant

## 2014-01-12 VITALS — BP 120/59 | HR 86 | Ht 65.0 in | Wt 107.0 lb

## 2014-01-12 DIAGNOSIS — R21 Rash and other nonspecific skin eruption: Secondary | ICD-10-CM

## 2014-01-12 MED ORDER — PREDNISONE 20 MG PO TABS: ORAL_TABLET | ORAL | Status: DC

## 2014-01-12 MED ORDER — CLOBETASOL PROPIONATE 0.05 % EX CREA: 1.0000 "application " | TOPICAL_CREAM | Freq: Two times a day (BID) | CUTANEOUS | Status: DC

## 2014-01-12 NOTE — Progress Notes (Signed)
   Subjective:    Patient ID: SAMONA CHIHUAHUA, female    DOB: 1990-12-24, 23 y.o.   MRN: [id number]  HPI Patient is a 23 year old female who presents to the clinic with ongoing rash for the last 2-1/2 weeks. She was first seen by myself and diagnosed her with what appeared to be folliculitis. She did not have any improvement with antibiotic. She then went to urgent care. He treated her with prednisone taper starting at 10 mg. He also did not have a firm diagnosis. Prednisone did seem to help minimally but rash continues to itch and become more noticeable and distinct. It seems to be in the same areas as original rash but worse. Has been in sun and not noticed that has made it any better. Her grandmother does have psoriasis. Denies any fever, chills, nausea, vomiting, diarrhea.    Review of Systems  All other systems reviewed and are negative.      Objective:   Physical Exam  Skin:             Assessment & Plan:  Rash- unclear etiology been seen twice for condition. Today the rash looks completely different than the first time I saw her. Appears like numular psorasis. Shave biopsy done today. Treated as psorasis with prednisone taper and clobetosol cream. Encouraged sun exposure in moderation. Will call with results. Call with any changing rash.   Shave Biopsy Procedure Note  Pre-operative Diagnosis: unclear dx suspect psorasis  Post-operative Diagnosis: same  Locations:specific lesion right lower abdomen. Rash all over body  Indications: unclear etiology  Anesthesia: Lidocaine 1% without epinephrine without added sodium bicarbonate  Procedure Details  History of allergy to iodine: no  Patient informed of the risks (including bleeding and infection) and benefits of the  procedure and Verbal informed consent obtained.  The lesion and surrounding area were given a sterile prep using alcohol and draped in the usual sterile fashion. A scalpel was used to shave an area of  skin approximately 1cm by 1cm.  Hemostasis achieved with alumuninum chloride. Antibiotic ointment and a sterile dressing applied.  The specimen was sent for pathologic examination. The patient tolerated the procedure well.  EBL: scant  Findings:   Condition: Stable  Complications: none.  Plan: 1. Instructed to keep the wound dry and covered for 24-48h and clean thereafter. 2. Warning signs of infection were reviewed.   3. Recommended that the patient use OTC acetaminophen as needed for pain.

## 2014-01-12 NOTE — Patient Instructions (Signed)
Start prednisone.  Use cream as needed up to twice a day.

## 2014-01-14 ENCOUNTER — Other Ambulatory Visit: Payer: Self-pay | Admitting: Physician Assistant

## 2014-01-14 DIAGNOSIS — L409 Psoriasis, unspecified: Secondary | ICD-10-CM

## 2014-04-30 ENCOUNTER — Emergency Department
Admission: EM | Admit: 2014-04-30 | Discharge: 2014-04-30 | Disposition: A | Discharge status: Home / Self Care | Payer: BC Managed Care – PPO | Source: Home / Self Care | Attending: Emergency Medicine | Admitting: Emergency Medicine

## 2014-04-30 ENCOUNTER — Encounter: Payer: Self-pay | Admitting: Emergency Medicine

## 2014-04-30 DIAGNOSIS — R300 Dysuria: Secondary | ICD-10-CM

## 2014-04-30 DIAGNOSIS — N3001 Acute cystitis with hematuria: Secondary | ICD-10-CM

## 2014-04-30 LAB — POCT URINALYSIS DIP (MANUAL ENTRY)
Bilirubin, UA: NEGATIVE
Glucose, UA: NEGATIVE
Ketones, POC UA: NEGATIVE
Nitrite, UA: NEGATIVE
Spec Grav, UA: 1.02 (ref 1.005–1.03)
Urobilinogen, UA: 0.2 (ref 0–1)
pH, UA: 6 (ref 5–8)

## 2014-04-30 MED ORDER — CIPROFLOXACIN HCL 500 MG PO TABS: 500.0000 mg | ORAL_TABLET | Freq: Two times a day (BID) | ORAL | Status: DC

## 2014-04-30 MED ORDER — CYCLOBENZAPRINE HCL 5 MG PO TABS
ORAL_TABLET | ORAL | Status: DC
Start: 2014-04-30 — End: 2014-08-17

## 2014-04-30 NOTE — ED Notes (Signed)
Ashley Watson c/o dysuria, pelvic pain and back pain x this am. Denies fever or hematuria. Taken cranberry tabs otc.

## 2014-04-30 NOTE — ED Provider Notes (Signed)
CSN: [id number]     Arrival date & time 04/30/14  1337 History   First MD Initiated Contact with Patient 04/30/14 1411     Chief Complaint  Patient presents with  . Dysuria   (Consider location/radiation/quality/duration/timing/severity/associated sxs/prior Treatment) HPI This is a 23 y.o. female who presents today with UTI symptoms for 1 day. + dysuria, with cloudy urine + frequency + urgency No hematuria No vaginal discharge No fever/chills + mild suprapubic lower abdominal pain No nausea No vomiting Mild back pain, which can be intermittent chronically, she requests small refill of Flexeril which has helped this in the past. No fatigue She denies chance of pregnancy.--Last menstrual period normal 04/14/2014 Has tried over-the-counter measures without improvement.    Past Medical History  Diagnosis Date  . Migraines   . Seizures    History reviewed. No pertinent past surgical history. Family History  Problem Relation Age of Onset  . Diabetes Father   . Hypertension Father    History  Substance Use Topics  . Smoking status: Never Smoker   . Smokeless tobacco: Never Used  . Alcohol Use: 3.6 oz/week    6 Cans of beer per week     Comment: 2   OB History   Grav Para Term Preterm Abortions TAB SAB Ect Mult Living   0 0 0 0 0 0 0 0 0 0      Review of Systems  All other systems reviewed and are negative.   Allergies  Latex  Home Medications   Prior to Admission medications   Medication Sig Start Date End Date Taking? Authorizing Provider  penicillin v potassium (VEETID) 500 MG tablet Take 500 mg by mouth 4 (four) times daily.   Yes Historical Provider, MD  ciprofloxacin (CIPRO) 500 MG tablet Take 1 tablet (500 mg total) by mouth 2 (two) times daily. For 7 days 04/30/14   Jacqulyn Cane, MD  clobetasol cream (TEMOVATE) 5.28 % Apply 1 application topically 2 (two) times daily. 01/12/14   Jade L Breeback, PA-C  cyclobenzaprine (FLEXERIL) 5 MG tablet Taje 1-2 tabs by  mouth daily as needed for muscle spasms 02/26/13   Jade L Breeback, PA-C  cyclobenzaprine (FLEXERIL) 5 MG tablet Take 1 every 8 hours as needed for muscle relaxant. Caution: May cause drowsiness. 04/30/14   Jacqulyn Cane, MD  hyoscyamine (LEVSIN, ANASPAZ) 0.125 MG tablet Take 1 tablet (0.125 mg total) by mouth every 4 (four) hours as needed for cramping or diarrhea or loose stools. 10/15/12   Silverio Decamp, MD  lubiprostone (AMITIZA) 8 MCG capsule Take 1 capsule (8 mcg total) by mouth 2 (two) times daily with a meal. 10/15/12   Silverio Decamp, MD  meloxicam (MOBIC) 7.5 MG tablet Take 1 tablet (7.5 mg total) by mouth daily. Take in evening with food 11/15/12   Kandra Nicolas, MD  senna-docusate (SENOKOT-S) 8.6-50 MG per tablet Take 2 tablets by mouth 2 (two) times daily. Until stooling regularly 10/15/12   Silverio Decamp, MD   BP 105/69  Pulse 84  Temp(Src) 98.3 F (36.8 C) (Oral)  Ht 5\' 5"  (1.651 m)  Wt 112 lb (50.803 kg)  BMI 18.64 kg/m2  SpO2 99%  LMP 04/14/2014 Physical Exam  Nursing note and vitals reviewed. Constitutional: She is oriented to person, place, and time. She appears well-developed and well-nourished. No distress.  HENT:  Mouth/Throat: Oropharynx is clear and moist.  Eyes: No scleral icterus.  Neck: Neck supple.  Cardiovascular: Normal rate, regular rhythm and normal  heart sounds.   Pulmonary/Chest: Breath sounds normal.  Abdominal: Soft. She exhibits no mass. There is no hepatosplenomegaly. There is tenderness in the suprapubic area. There is no rebound, no guarding and no CVA tenderness.  Lymphadenopathy:    She has no cervical adenopathy.  Neurological: She is alert and oriented to person, place, and time.  Skin: Skin is warm and dry.   Back: No spinal tenderness. No CVA tenderness. She does have mild bilateral paralumbar tenderness and spasm. Neurologic nonfocal. ED Course  Procedures (including critical care time) Labs Review Labs Reviewed   URINE CULTURE  POCT URINALYSIS DIP (MANUAL ENTRY)    Results for orders placed during the hospital encounter of 04/30/14  POCT URINALYSIS DIP (MANUAL ENTRY)      Result Value Ref Range   Color, UA yellow     Clarity, UA cloudy     Glucose, UA neg     Bilirubin, UA negative     Bilirubin, UA negative     Spec Grav, UA 1.020  1.005 - 1.03   Blood, UA large     pH, UA 6.0  5 - 8   Protein Ur, POC trace     Urobilinogen, UA 0.2  0 - 1   Nitrite, UA Negative     Leukocytes, UA small (1+)       Imaging Review No results found.   MDM   1. Acute cystitis with hematuria   2. Dysuria    Treatment options discussed, as well as risks, benefits, alternatives. Patient voiced understanding and agreement with the following plans: Urine culture Cipro 500 twice a day I agreed to refill small amount of Flexeril for low back muscle spasms to followup with PCP if not improving. Follow-up with your primary care doctor in 5-7 days if not improving, or sooner if symptoms become worse. Precautions discussed. Red flags discussed. Questions invited and answered. Patient voiced understanding and agreement.     Jacqulyn Cane, MD 04/30/14 [phone]

## 2014-05-03 ENCOUNTER — Telehealth: Payer: Self-pay | Admitting: *Deleted

## 2014-05-03 LAB — URINE CULTURE: Colony Count: 25000

## 2014-05-12 ENCOUNTER — Encounter: Payer: Self-pay | Admitting: Emergency Medicine

## 2014-05-12 ENCOUNTER — Emergency Department
Admission: EM | Admit: 2014-05-12 | Discharge: 2014-05-12 | Disposition: A | Discharge status: Home / Self Care | Payer: BC Managed Care – PPO | Source: Home / Self Care | Attending: Emergency Medicine | Admitting: Emergency Medicine

## 2014-05-12 DIAGNOSIS — J039 Acute tonsillitis, unspecified: Secondary | ICD-10-CM

## 2014-05-12 LAB — POCT RAPID STREP A (OFFICE): Rapid Strep A Screen: NEGATIVE

## 2014-05-12 MED ORDER — PENICILLIN V POTASSIUM 500 MG PO TABS: 500.0000 mg | ORAL_TABLET | Freq: Four times a day (QID) | ORAL | Status: DC

## 2014-05-12 NOTE — ED Provider Notes (Signed)
CSN: [id number]     Arrival date & time 05/12/14  1546 History   First MD Initiated Contact with Patient 05/12/14 1616     Chief Complaint  Patient presents with  . Sore Throat  . Otalgia   (Consider location/radiation/quality/duration/timing/severity/associated sxs/prior Treatment) Patient is a 23 y.o. female presenting with pharyngitis and ear pain. The history is provided by the patient. No language interpreter was used.  Sore Throat This is a new problem. The current episode started more than 2 days ago. The problem occurs constantly. The problem has been gradually worsening. Pertinent negatives include no headaches and no shortness of breath. Nothing aggravates the symptoms. Nothing relieves the symptoms. She has tried acetaminophen for the symptoms. The treatment provided mild relief.  Otalgia Associated symptoms: no headaches   Pt complains of tonsils swelling and coating on tonsils.    Past Medical History  Diagnosis Date  . Migraines   . Seizures    History reviewed. No pertinent past surgical history. Family History  Problem Relation Age of Onset  . Diabetes Father   . Hypertension Father    History  Substance Use Topics  . Smoking status: Never Smoker   . Smokeless tobacco: Never Used  . Alcohol Use: 3.6 oz/week    6 Cans of beer per week     Comment: 2   OB History   Grav Para Term Preterm Abortions TAB SAB Ect Mult Living   0 0 0 0 0 0 0 0 0 0      Review of Systems  HENT: Positive for ear pain.   Respiratory: Negative for shortness of breath.   Neurological: Negative for headaches.  All other systems reviewed and are negative.   Allergies  Latex  Home Medications   Prior to Admission medications   Medication Sig Start Date End Date Taking? Authorizing Provider  ciprofloxacin (CIPRO) 500 MG tablet Take 1 tablet (500 mg total) by mouth 2 (two) times daily. For 7 days 04/30/14   Jacqulyn Cane, MD  clobetasol cream (TEMOVATE) 3.24 % Apply 1 application  topically 2 (two) times daily. 01/12/14   Jade L Breeback, PA-C  cyclobenzaprine (FLEXERIL) 5 MG tablet Taje 1-2 tabs by mouth daily as needed for muscle spasms 02/26/13   Jade L Breeback, PA-C  cyclobenzaprine (FLEXERIL) 5 MG tablet Take 1 every 8 hours as needed for muscle relaxant. Caution: May cause drowsiness. 04/30/14   Jacqulyn Cane, MD  hyoscyamine (LEVSIN, ANASPAZ) 0.125 MG tablet Take 1 tablet (0.125 mg total) by mouth every 4 (four) hours as needed for cramping or diarrhea or loose stools. 10/15/12   Silverio Decamp, MD  lubiprostone (AMITIZA) 8 MCG capsule Take 1 capsule (8 mcg total) by mouth 2 (two) times daily with a meal. 10/15/12   Silverio Decamp, MD  meloxicam (MOBIC) 7.5 MG tablet Take 1 tablet (7.5 mg total) by mouth daily. Take in evening with food 11/15/12   Kandra Nicolas, MD  penicillin v potassium (VEETID) 500 MG tablet Take 1 tablet (500 mg total) by mouth 4 (four) times daily. 05/12/14   Fransico Meadow, PA-C  senna-docusate (SENOKOT-S) 8.6-50 MG per tablet Take 2 tablets by mouth 2 (two) times daily. Until stooling regularly 10/15/12   Silverio Decamp, MD   BP 122/81  Pulse 101  Temp(Src) 98.5 F (36.9 C) (Oral)  Resp 16  Ht 5\' 5"  (1.651 m)  Wt 109 lb (49.442 kg)  BMI 18.14 kg/m2  SpO2 100%  LMP 04/14/2014 Physical  Exam  Nursing note and vitals reviewed. Constitutional: She is oriented to person, place, and time. She appears well-developed and well-nourished.  HENT:  Head: Normocephalic.  Right Ear: External ear normal.  Left Ear: External ear normal.  Tm clear   Tonsillar edema and white exudate,  Redness roof of mouth  Eyes: Conjunctivae and EOM are normal. Pupils are equal, round, and reactive to light.  Neck: Normal range of motion.  Cardiovascular: Normal rate and normal heart sounds.   Pulmonary/Chest: Effort normal and breath sounds normal.  Abdominal: She exhibits no distension.  Musculoskeletal: Normal range of motion.  Neurological:  She is alert and oriented to person, place, and time.  Psychiatric: She has a normal mood and affect.    ED Course  Strep negative  Procedures (including critical care time) Labs Review Labs Reviewed  POCT RAPID STREP A (OFFICE)    Imaging Review No results found.   MDM   1. Exudative tonsillitis    Strep is negative,   I am going to cover with PCN due to tonsillar enlargement,   Strep culture paending PCN VK Tylenol Gargles, Lozenges AVS Follow up with Dr. Darene Lamer in 1 week if symptoms persist   Fransico Meadow, PA-C 05/12/14 1622

## 2014-05-12 NOTE — Discharge Instructions (Signed)

## 2014-05-12 NOTE — ED Notes (Signed)
Pt c/o sore throat and bilateral ear ache x 1 day. Denies fever.

## 2014-05-14 NOTE — ED Provider Notes (Signed)
Medical history/examination/treatment/procedure(s) were performed by non-physician provider and as supervising physician I was immediately available for consultation/collaboration.   Jacqulyn Cane, MD 05/14/14 [phone]

## 2014-06-01 ENCOUNTER — Encounter: Payer: Self-pay | Admitting: *Deleted

## 2014-06-01 ENCOUNTER — Emergency Department (INDEPENDENT_AMBULATORY_CARE_PROVIDER_SITE_OTHER)
Admission: EM | Admit: 2014-06-01 | Discharge: 2014-06-01 | Disposition: A | Discharge status: Home / Self Care | Payer: BC Managed Care – PPO | Source: Home / Self Care

## 2014-06-01 DIAGNOSIS — J029 Acute pharyngitis, unspecified: Secondary | ICD-10-CM

## 2014-06-01 LAB — POCT RAPID STREP A (OFFICE): RAPID STREP A SCREEN: NEGATIVE

## 2014-06-01 MED ORDER — IPRATROPIUM BROMIDE 0.06 % NA SOLN: 2.0000 | Freq: Four times a day (QID) | NASAL | Status: DC

## 2014-06-01 NOTE — Discharge Instructions (Signed)
Thank you for coming in today. Use Atrovent nasal spray. Take ibuprofen or Aleve for pain as needed I will call you if anything comes back positive  Pharyngitis Pharyngitis is redness, pain, and swelling (inflammation) of your pharynx.  CAUSES  Pharyngitis is usually caused by infection. Most of the time, these infections are from viruses (viral) and are part of a cold. However, sometimes pharyngitis is caused by bacteria (bacterial). Pharyngitis can also be caused by allergies. Viral pharyngitis may be spread from person to person by coughing, sneezing, and personal items or utensils (cups, forks, spoons, toothbrushes). Bacterial pharyngitis may be spread from person to person by more intimate contact, such as kissing.  SIGNS AND SYMPTOMS  Symptoms of pharyngitis include:   Sore throat.   Tiredness (fatigue).   Low-grade fever.   Headache.  Joint pain and muscle aches.  Skin rashes.  Swollen lymph nodes.  Plaque-like film on throat or tonsils (often seen with bacterial pharyngitis). DIAGNOSIS  Your health care provider will ask you questions about your illness and your symptoms. Your medical history, along with a physical exam, is often all that is needed to diagnose pharyngitis. Sometimes, a rapid strep test is done. Other lab tests may also be done, depending on the suspected cause.  TREATMENT  Viral pharyngitis will usually get better in 3-4 days without the use of medicine. Bacterial pharyngitis is treated with medicines that kill germs (antibiotics).  HOME CARE INSTRUCTIONS   Drink enough water and fluids to keep your urine clear or pale yellow.   Only take over-the-counter or prescription medicines as directed by your health care provider:   If you are prescribed antibiotics, make sure you finish them even if you start to feel better.   Do not take aspirin.   Get lots of rest.   Gargle with 8 oz of salt water ( tsp of salt per 1 qt of water) as often as  every 1-2 hours to soothe your throat.   Throat lozenges (if you are not at risk for choking) or sprays may be used to soothe your throat. SEEK MEDICAL CARE IF:   You have large, tender lumps in your neck.  You have a rash.  You cough up green, yellow-brown, or bloody spit. SEEK IMMEDIATE MEDICAL CARE IF:   Your neck becomes stiff.  You drool or are unable to swallow liquids.  You vomit or are unable to keep medicines or liquids down.  You have severe pain that does not go away with the use of recommended medicines.  You have trouble breathing (not caused by a stuffy nose). MAKE SURE YOU:   Understand these instructions.  Will watch your condition.  Will get help right away if you are not doing well or get worse. Document Released: 07/17/2005 Document Revised: 05/07/2013 Document Reviewed: 03/24/2013 [facility] Patient Information 2015 [place], [place]. This information is not intended to replace advice given to you by your health care provider. Make sure you discuss any questions you have with your health care provider.

## 2014-06-01 NOTE — ED Notes (Signed)
Pt c/o sore throat mostly on left side with associated ear pain. She tested negative for strep on 05/12/14 but was treated with PCN. She denies completing entire course.

## 2014-06-01 NOTE — ED Provider Notes (Signed)
Ashley Watson is a 23 y.o. female who presents to Urgent Care today for Sore throat. Patient has had a sore throat present now for around 2 weeks. She was seen initially on the 13th and given penicillin for presumptive strep throat. A culture was not obtained. This did not help in her throat pain continues. She notes left-sided pain associated with ear pressure. The pain is worse with talking and swallowing. No difficulty swallowing. No fevers or chills nausea vomiting or diarrhea.   Past Medical History  Diagnosis Date  . Migraines   . Seizures    History reviewed. No pertinent past surgical history. History  Substance Use Topics  . Smoking status: Never Smoker   . Smokeless tobacco: Never Used  . Alcohol Use: 3.6 oz/week    6 Cans of beer per week     Comment: 2   ROS as above Medications: No current facility-administered medications for this encounter.   Current Outpatient Prescriptions  Medication Sig Dispense Refill  . clobetasol cream (TEMOVATE) 7.00 % Apply 1 application topically 2 (two) times daily. 60 g 2  . cyclobenzaprine (FLEXERIL) 5 MG tablet Taje 1-2 tabs by mouth daily as needed for muscle spasms 30 tablet 1  . cyclobenzaprine (FLEXERIL) 5 MG tablet Take 1 every 8 hours as needed for muscle relaxant. Caution: May cause drowsiness. 15 tablet 0  . hyoscyamine (LEVSIN, ANASPAZ) 0.125 MG tablet Take 1 tablet (0.125 mg total) by mouth every 4 (four) hours as needed for cramping or diarrhea or loose stools. 90 tablet 3  . ipratropium (ATROVENT) 0.06 % nasal spray Place 2 sprays into both nostrils 4 (four) times daily. 15 mL 1  . lubiprostone (AMITIZA) 8 MCG capsule Take 1 capsule (8 mcg total) by mouth 2 (two) times daily with a meal. 24 capsule 0  . meloxicam (MOBIC) 7.5 MG tablet Take 1 tablet (7.5 mg total) by mouth daily. Take in evening with food 15 tablet 1  . senna-docusate (SENOKOT-S) 8.6-50 MG per tablet Take 2 tablets by mouth 2 (two) times daily. Until stooling  regularly 60 tablet 0   Allergies  Allergen Reactions  . Latex      Exam:  BP 123/82 mmHg  Pulse 101  Temp(Src) 99.2 F (37.3 C) (Oral)  Resp 14  Wt 110 lb (49.896 kg)  SpO2 99%  LMP 05/11/2014 Gen: Well NAD HEENT: EOMI,  MMM posterior pharynx is mildly erythematous. Tonsils are normal and symmetrical bilaterally. No uvula deviation of from the midline no trismus. Mild bilateral anterior cervical lymphadenopathy present. Normal tympanic membranes bilaterally Lungs: Normal work of breathing. CTABL Heart: RRR no MRG Abd: NABS, Soft. Nondistended, Nontender Exts: Brisk capillary refill, warm and well perfused.   Results for orders placed or performed during the hospital encounter of 06/01/14 (from the past 24 hour(s))  POCT rapid strep A     Status: None   Collection Time: 06/01/14  2:28 PM  Result Value Ref Range   Rapid Strep A Screen Negative Negative   No results found.  Assessment and Plan: 23 y.o. female with pharyngitis. Unclear etiology. Antibody panel for mono pending. Treatment with Tylenol or ibuprofen.  Discussed warning signs or symptoms. Please see discharge instructions. Patient expresses understanding.     Gregor Hams, MD 06/01/14 [phone]

## 2014-06-02 LAB — EPSTEIN-BARR VIRUS VCA ANTIBODY PANEL
EBV NA IgG: 313 U/mL — ABNORMAL HIGH (ref <18.0)
EBV VCA IGG: 341 U/mL — AB (ref <18.0)

## 2014-06-03 ENCOUNTER — Telehealth: Payer: Self-pay | Admitting: *Deleted

## 2014-06-03 LAB — STREP A DNA PROBE: GASP: NEGATIVE

## 2014-07-31 HISTORY — PX: TOOTH EXTRACTION: SUR596

## 2014-08-17 ENCOUNTER — Encounter: Payer: Self-pay | Admitting: Sports Medicine

## 2014-08-17 ENCOUNTER — Ambulatory Visit (INDEPENDENT_AMBULATORY_CARE_PROVIDER_SITE_OTHER): Payer: BLUE CROSS/BLUE SHIELD | Admitting: Sports Medicine

## 2014-08-17 VITALS — BP 127/78 | HR 61 | Ht 65.0 in | Wt 115.0 lb

## 2014-08-17 DIAGNOSIS — R300 Dysuria: Secondary | ICD-10-CM | POA: Diagnosis not present

## 2014-08-17 DIAGNOSIS — G43009 Migraine without aura, not intractable, without status migrainosus: Secondary | ICD-10-CM | POA: Diagnosis not present

## 2014-08-17 DIAGNOSIS — Z3009 Encounter for other general counseling and advice on contraception: Secondary | ICD-10-CM | POA: Diagnosis not present

## 2014-08-17 MED ORDER — TOPIRAMATE 25 MG PO TABS: 12.5000 mg | ORAL_TABLET | Freq: Every day | ORAL | Status: DC

## 2014-08-17 MED ORDER — ACETAMINOPHEN ER 650 MG PO TBCR
650.0000 mg | EXTENDED_RELEASE_TABLET | Freq: Three times a day (TID) | ORAL | Status: DC | PRN
Start: 2014-08-17 — End: 2015-01-19

## 2014-08-17 MED ORDER — NORGESTIM-ETH ESTRAD TRIPHASIC 0.18/0.215/0.25 MG-25 MCG PO TABS: 1.0000 | ORAL_TABLET | Freq: Every day | ORAL | Status: DC

## 2014-08-17 MED ORDER — SUMATRIPTAN SUCCINATE 25 MG PO TABS
25.0000 mg | ORAL_TABLET | ORAL | Status: DC | PRN
Start: 2014-08-17 — End: 2014-11-16

## 2014-08-17 NOTE — Assessment & Plan Note (Signed)
Migraine headaches are several times per week, last several days. No aura. Starting low-dose Topamax, with Tylenol and Imitrex as abortants

## 2014-08-17 NOTE — Assessment & Plan Note (Signed)
Discussed the multiple forms of contraception.  Patient desires to start combination oral contraception. Urine pregnancy test downstairs.

## 2014-08-17 NOTE — Progress Notes (Signed)
  Subjective:    CC: multiple issues  HPI: Family planning: Desires to start birth control, has never used it before, declines any chance of pregnancy.  Migraines: Daily, right-sided temporal and neck, throbbing, with photophobia, phonophobia, and only mild nausea. She tells me she has been on a preventative medicine in the past but did not tolerate.  Dysuria: Mild burning, frequency, urgency. No flank pain, no constitutional symptoms.  Past medical history, Surgical history, Family history not pertinant except as noted below, Social history, Allergies, and medications have been entered into the medical record, reviewed, and no changes needed.   Review of Systems: No fevers, chills, night sweats, weight loss, chest pain, or shortness of breath.   Objective:    General: Well Developed, well nourished, and in no acute distress.  Neuro: Alert and oriented x3, extra-ocular muscles intact, sensation grossly intact.  HEENT: Normocephalic, atraumatic, pupils equal round reactive to light, neck supple, no masses, no lymphadenopathy, thyroid nonpalpable. Cranial nerves II through XII are intact, motor, sensory, and coordinative functions are all intact. Skin: Warm and dry, no rashes. Cardiac: Regular rate and rhythm, no murmurs rubs or gallops, no lower extremity edema.  Respiratory: Clear to auscultation bilaterally. Not using accessory muscles, speaking in full sentences. Abdomen: Soft, minimally tender to palpation in the suprapubic region, nondistended, normal bowel sounds, no costovertebral angle pain.  Impression and Recommendations:

## 2014-08-17 NOTE — Patient Instructions (Signed)
Keep a headache diary and follow up in one month.

## 2014-08-17 NOTE — Assessment & Plan Note (Addendum)
Urinalysis and urine culture in the lab.  Positive pyuria, adding Keflex

## 2014-08-18 LAB — URINALYSIS
Bilirubin Urine: NEGATIVE
Glucose, UA: NEGATIVE mg/dL
Ketones, ur: NEGATIVE mg/dL
Nitrite: NEGATIVE
Protein, ur: NEGATIVE mg/dL
Specific Gravity, Urine: 1.02 (ref 1.005–1.030)
Urobilinogen, UA: 0.2 mg/dL (ref 0.0–1.0)
pH: 6 (ref 5.0–8.0)

## 2014-08-18 LAB — PREGNANCY, URINE: Preg Test, Ur: NEGATIVE

## 2014-08-18 MED ORDER — CEPHALEXIN 500 MG PO CAPS: 500.0000 mg | ORAL_CAPSULE | Freq: Two times a day (BID) | ORAL | Status: DC

## 2014-08-18 NOTE — Addendum Note (Signed)
Addended by: Silverio Decamp on: 08/18/2014 12:38 PM   Modules accepted: Orders

## 2014-08-20 LAB — URINE CULTURE: Colony Count: >=100000

## 2014-09-22 ENCOUNTER — Encounter: Payer: Self-pay | Admitting: *Deleted

## 2014-09-22 ENCOUNTER — Emergency Department
Admission: EM | Admit: 2014-09-22 | Discharge: 2014-09-22 | Disposition: A | Discharge status: Home / Self Care | Payer: BLUE CROSS/BLUE SHIELD | Source: Home / Self Care | Attending: Emergency Medicine | Admitting: Emergency Medicine

## 2014-09-22 DIAGNOSIS — N3001 Acute cystitis with hematuria: Secondary | ICD-10-CM

## 2014-09-22 DIAGNOSIS — R300 Dysuria: Secondary | ICD-10-CM

## 2014-09-22 LAB — POCT URINALYSIS DIP (MANUAL ENTRY)
Bilirubin, UA: NEGATIVE
Glucose, UA: NEGATIVE
Nitrite, UA: NEGATIVE
Spec Grav, UA: 1.025 (ref 1.005–1.03)
Urobilinogen, UA: 0.2 (ref 0–1)
pH, UA: 5.5 (ref 5–8)

## 2014-09-22 MED ORDER — CEPHALEXIN 250 MG PO CAPS: 250.0000 mg | ORAL_CAPSULE | Freq: Three times a day (TID) | ORAL | Status: DC

## 2014-09-22 MED ORDER — ONDANSETRON HCL 4 MG PO TABS: 4.0000 mg | ORAL_TABLET | Freq: Four times a day (QID) | ORAL | Status: DC

## 2014-09-22 NOTE — ED Provider Notes (Signed)
CSN: [id number]     Arrival date & time 09/22/14  1322 History   First MD Initiated Contact with Patient 09/22/14 1334     Chief Complaint  Patient presents with  . Urinary Tract Infection   (Consider location/radiation/quality/duration/timing/severity/associated sxs/prior Treatment) HPI [name] who presents today with UTI symptoms for 1 day  + dysuria + frequency + urgency No hematuria No vaginal discharge No fever/chills No lower abdominal pain No nausea No vomiting No back pain No fatigue She denies chance of pregnancy.--Last menstrual period 3 weeks ago. Has tried over-the-counter measures without improvement.    Past Medical History  Diagnosis Date  . Migraines   . Seizures    History reviewed. No pertinent past surgical history. Family History  Problem Relation Age of Onset  . Diabetes Father   . Hypertension Father    History  Substance Use Topics  . Smoking status: Never Smoker   . Smokeless tobacco: Never Used  . Alcohol Use: 3.6 oz/week    6 Cans of beer per week     Comment: 2   OB History    Gravida Para Term Preterm AB TAB SAB Ectopic Multiple Living   0 0 0 0 0 0 0 0 0 0      Review of Systems  Allergies  Latex  Home Medications   Prior to Admission medications   Medication Sig Start Date End Date Taking? Authorizing Provider  acetaminophen (TYLENOL) 650 MG CR tablet Take 1 tablet (650 mg total) by mouth every 8 (eight) hours as needed for pain. 08/17/14   Silverio Decamp, MD  cephALEXin (KEFLEX) 250 MG capsule Take 1 capsule (250 mg total) by mouth 3 (three) times daily. For 7 days 09/22/14   Jacqulyn Cane, MD  clobetasol cream (TEMOVATE) 3.76 % Apply 1 application topically 2 (two) times daily. 01/12/14   Jade L Breeback, PA-C  hyoscyamine (LEVSIN, ANASPAZ) 0.125 MG tablet Take 1 tablet (0.125 mg total) by mouth every 4 (four) hours as needed for cramping or diarrhea or loose stools. 10/15/12   Silverio Decamp, MD   ipratropium (ATROVENT) 0.06 % nasal spray Place 2 sprays into both nostrils 4 (four) times daily. 06/01/14   Gregor Hams, MD  Norgestimate-Ethinyl Estradiol Triphasic 0.18/0.215/0.25 MG-25 MCG tab Take 1 tablet by mouth daily. 08/17/14   Silverio Decamp, MD  ondansetron (ZOFRAN) 4 MG tablet Take 1 tablet (4 mg total) by mouth every 6 (six) hours. As needed for nausea 09/22/14   Jacqulyn Cane, MD  SUMAtriptan (IMITREX) 25 MG tablet Take 1 tablet (25 mg total) by mouth every 2 (two) hours as needed for migraine. May repeat in 2 hours if headache persists or recurs. 08/17/14   Silverio Decamp, MD  topiramate (TOPAMAX) 25 MG tablet Take 0.5 tablets (12.5 mg total) by mouth daily. 08/17/14   Silverio Decamp, MD   BP 104/70 mmHg  Pulse 71  Resp 14  Wt 120 lb (54.432 kg)  SpO2 99%  LMP 09/01/2014 Physical Exam  Constitutional: She is oriented to person, place, and time. She appears well-developed and well-nourished. No distress.  HENT:  Head: Normocephalic and atraumatic.  Eyes: Conjunctivae and EOM are normal. Pupils are equal, round, and reactive to light. No scleral icterus.  Neck: Normal range of motion.  Cardiovascular: Normal rate.   Pulmonary/Chest: Effort normal.  Abdominal: She exhibits no distension.  Musculoskeletal: Normal range of motion.  Neurological: She is alert and oriented to person,  place, and time.  Skin: Skin is warm.  Psychiatric: She has a normal mood and affect.  Nursing note and vitals reviewed. . Abdomen soft and nontender. No CVA tenderness.  she declined any other exam. ED Course  Procedures (including critical care time) Labs Review Labs Reviewed  URINE CULTURE  POCT URINALYSIS DIP (MANUAL ENTRY)   Results for orders placed or performed during the hospital encounter of 09/22/14  POCT urinalysis dipstick (new)  Result Value Ref Range   Color, UA yellow    Clarity, UA cloudy    Glucose, UA neg    Bilirubin, UA negative    Bilirubin, UA  trace (5)    Spec Grav, UA 1.025 1.005 - 1.03   Blood, UA moderate    pH, UA 5.5 5 - 8   Protein Ur, POC trace    Urobilinogen, UA 0.2 0 - 1   Nitrite, UA Negative    Leukocytes, UA moderate (2+)      Imaging Review No results found.   MDM   1. Acute cystitis with hematuria   2. Dysuria    Urine culture sent. She states she's never had urology evaluation for frequent UTIs, occurring 4-6 times per year.  Follow-up with urologist, names and phone numbers given. Treatment options discussed, as well as risks, benefits, alternatives. Patient voiced understanding and agreement with the following plans: New Prescriptions   CEPHALEXIN (KEFLEX) 250 MG CAPSULE    Take 1 capsule (250 mg total) by mouth 3 (three) times daily. For 7 days   ONDANSETRON (ZOFRAN) 4 MG TABLET    Take 1 tablet (4 mg total) by mouth every 6 (six) hours. As needed for nausea  Precautions discussed. Red flags discussed. Questions invited and answered. Patient voiced understanding and agreement.     Jacqulyn Cane, MD 09/22/14 [phone]

## 2014-09-22 NOTE — ED Notes (Signed)
Pt c/o dysuria and polyuria x last night.

## 2014-09-24 LAB — URINE CULTURE: Colony Count: 10000

## 2014-11-16 ENCOUNTER — Encounter: Payer: Self-pay | Admitting: Physician Assistant

## 2014-11-16 ENCOUNTER — Ambulatory Visit (INDEPENDENT_AMBULATORY_CARE_PROVIDER_SITE_OTHER): Payer: BLUE CROSS/BLUE SHIELD | Admitting: Physician Assistant

## 2014-11-16 VITALS — BP 118/76 | HR 103 | Wt 122.0 lb

## 2014-11-16 DIAGNOSIS — R1032 Left lower quadrant pain: Secondary | ICD-10-CM

## 2014-11-16 DIAGNOSIS — R1031 Right lower quadrant pain: Secondary | ICD-10-CM

## 2014-11-16 DIAGNOSIS — J029 Acute pharyngitis, unspecified: Secondary | ICD-10-CM | POA: Diagnosis not present

## 2014-11-16 LAB — POCT RAPID STREP A (OFFICE): RAPID STREP A SCREEN: NEGATIVE

## 2014-11-16 MED ORDER — ONDANSETRON HCL 8 MG PO TABS: 8.0000 mg | ORAL_TABLET | Freq: Three times a day (TID) | ORAL | Status: DC | PRN

## 2014-11-16 MED ORDER — DICYCLOMINE HCL 10 MG PO CAPS: 10.0000 mg | ORAL_CAPSULE | Freq: Three times a day (TID) | ORAL | Status: DC

## 2014-11-16 NOTE — Progress Notes (Signed)
   Subjective:    Patient ID: Ashley Watson, female    DOB: 10/29/1990, 24 y.o.   MRN: [id number]  HPI  Pt is a 24 yo female who presents to the clinic with off and on ST, head congestion, ear pain and now lower abdominal pain and cramping. Abdominal pain and cramping is what brought her in today. No vomiting or diarrhea. Very nauseated. No headaches. Not close to cycle. Ibuprofen has not help. Not aggravated by food. Worst pain with movement. Having bowel movement and not constipated. No fever, chills, body aches or urinary symptoms.     Review of Systems  All other systems reviewed and are negative.      Objective:   Physical Exam  Constitutional: She appears well-developed and well-nourished.  HENT:  Head: Normocephalic and atraumatic.  Right Ear: External ear normal.  Left Ear: External ear normal.  Nose: Nose normal.  TM"s clear bilaterally.   Oropharynx erythematous with some mild tonsilar swelling no exudate.   Eyes: Conjunctivae are normal. Right eye exhibits no discharge. Left eye exhibits no discharge.  Neck: Normal range of motion. Neck supple.  Cardiovascular: Normal rate, regular rhythm and normal heart sounds.   Pulmonary/Chest: Effort normal and breath sounds normal. She has no wheezes.  Abdominal: Soft. Bowel sounds are normal. She exhibits no distension.  Tenderness diffusely over right and left lower abdomen. No rebound or guarding.   Lymphadenopathy:    She has no cervical adenopathy.  Psychiatric: She has a normal mood and affect. Her behavior is normal.          Assessment & Plan:  Acute pharyngitis/stomach cramping/discomfort- suspect viral etiology. Rapid strep was negative. Discussed symptomatic care for ST. zofran given for nausea. Bentyl given for abdominal spasms and cramping. Will get cbc today to make sure WBC is in normal range. Written out of work for today. Keep to [employer]. Rest and hydrate. Consider probiotic for good gut flora.

## 2014-11-16 NOTE — Patient Instructions (Signed)

## 2015-01-19 ENCOUNTER — Emergency Department
Admission: EM | Admit: 2015-01-19 | Discharge: 2015-01-19 | Disposition: A | Discharge status: Home / Self Care | Payer: BLUE CROSS/BLUE SHIELD | Source: Home / Self Care | Attending: Emergency Medicine | Admitting: Emergency Medicine

## 2015-01-19 ENCOUNTER — Encounter: Payer: Self-pay | Admitting: *Deleted

## 2015-01-19 DIAGNOSIS — N912 Amenorrhea, unspecified: Secondary | ICD-10-CM | POA: Diagnosis not present

## 2015-01-19 DIAGNOSIS — K047 Periapical abscess without sinus: Secondary | ICD-10-CM

## 2015-01-19 DIAGNOSIS — R300 Dysuria: Secondary | ICD-10-CM | POA: Diagnosis not present

## 2015-01-19 LAB — POCT URINALYSIS DIP (MANUAL ENTRY)
Glucose, UA: NEGATIVE
Leukocytes, UA: NEGATIVE
Nitrite, UA: NEGATIVE
Protein Ur, POC: 30 — AB
Spec Grav, UA: >=1.03 (ref 1.005–1.03)
Urobilinogen, UA: 0.2 (ref 0–1)
pH, UA: 5.5 (ref 5–8)

## 2015-01-19 LAB — POCT URINE PREGNANCY: Preg Test, Ur: NEGATIVE

## 2015-01-19 MED ORDER — AMOXICILLIN-POT CLAVULANATE 875-125 MG PO TABS: 1.0000 | ORAL_TABLET | Freq: Two times a day (BID) | ORAL | Status: DC

## 2015-01-19 MED ORDER — HYDROCODONE-ACETAMINOPHEN 5-325 MG PO TABS: 1.0000 | ORAL_TABLET | ORAL | Status: DC | PRN

## 2015-01-19 NOTE — ED Notes (Signed)
When I inquired about LMP she reported she is 2 weeks late. She does not take any oral contraceptives but reports her periods have been normal. She also has appetite changes, cloudy urine, and breast changes.

## 2015-01-19 NOTE — ED Provider Notes (Addendum)
CSN: [id number]     Arrival date & time 01/19/15  1537 History   First MD Initiated Contact with Patient 01/19/15 1605     Chief Complaint  Patient presents with  . Oral Swelling    Patient is a 24 y.o. female presenting with tooth pain.  Dental Pain Location:  Lower Lower teeth location:  17/LL 3rd molar Quality:  Dull and localized Severity:  Moderate (5 out of 10 intensity) Onset quality:  Sudden Duration:  1 day Timing:  Constant Progression:  Worsening Chronicity:  New Context: not trauma   Prior workup: None, but she has made an appointment to see an oral surgeon tomorrow. Relieved by:  Nothing Ineffective treatments:  Acetaminophen and NSAIDs Associated symptoms: facial pain (Localized left), facial swelling (Localized left) and fever   Associated symptoms: no neck pain, no neck swelling, no oral bleeding and no trismus     Past Medical History  Diagnosis Date  . Migraines   . Seizures    History reviewed. No pertinent past surgical history. Family History  Problem Relation Age of Onset  . Diabetes Father   . Hypertension Father    History  Substance Use Topics  . Smoking status: Never Smoker   . Smokeless tobacco: Never Used  . Alcohol Use: 3.6 oz/week    6 Cans of beer per week     Comment: 2   OB History    Gravida Para Term Preterm AB TAB SAB Ectopic Multiple Living   0 0 0 0 0 0 0 0 0 0      Review of Systems  Constitutional: Positive for fever.  HENT: Positive for facial swelling (Localized left).   Musculoskeletal: Negative for neck pain.   last menstrual period 12/08/2014.--After questioning, she admits she's 2 weeks late for her menstrual period.  Denies any vaginal bleeding or discharge. May have had some mild dysuria last week but that resolved. No abdominal pain. No nausea or vomiting or flank or back pain. Remainder of Review of Systems negative for acute change except as noted in the HPI.   Allergies  Latex  Home Medications   Prior  to Admission medications   Medication Sig Start Date End Date Taking? Authorizing Provider  amoxicillin-clavulanate (AUGMENTIN) 875-125 MG per tablet Take 1 tablet by mouth 2 (two) times daily. Take with food. 01/19/15   Jacqulyn Cane, MD  HYDROcodone-acetaminophen (NORCO/VICODIN) 5-325 MG per tablet Take 1-2 tablets by mouth every 4 (four) hours as needed for severe pain. Take with food. 01/19/15   Jacqulyn Cane, MD   BP 137/85 mmHg  Pulse 71  Temp(Src) 98.6 F (37 C) (Oral)  Resp 14  Wt 118 lb (53.524 kg)  SpO2 100%  LMP 12/08/2014 Physical Exam  Constitutional: She is oriented to person, place, and time. She appears well-developed and well-nourished. No distress.  HENT:  Head: Normocephalic and atraumatic.  Mouth/Throat: Oropharynx is clear and moist and mucous membranes are normal.    Mild swelling and tenderness of face directly over left lower third molar. No redness or fluctuance. No red streaks.  Eyes: Conjunctivae and EOM are normal. Pupils are equal, round, and reactive to light. No scleral icterus.  Neck: Normal range of motion.  Cardiovascular: Normal rate.   Pulmonary/Chest: Effort normal.  Abdominal: She exhibits no distension.  Musculoskeletal: Normal range of motion.  Neurological: She is alert and oriented to person, place, and time.  Skin: Skin is warm.  Psychiatric: She has a normal mood and affect.  Nursing  note and vitals reviewed.   ED Course  Procedures (including critical care time) Labs Review Labs Reviewed  POCT URINALYSIS DIP (MANUAL ENTRY) - Abnormal; Notable for the following:    Clarity, UA cloudy (*)    Bilirubin, UA small (*)    Bilirubin, UA small (15) (*)    Blood, UA moderate (*)    Protein Ur, POC =30 (*)    All other components within normal limits  URINE CULTURE  POCT URINE PREGNANCY   Results for orders placed or performed during the hospital encounter of 01/19/15  POCT urine pregnancy  Result Value Ref Range   Preg Test, Ur  Negative Negative  POCT urinalysis dipstick (new)  Result Value Ref Range   Color, UA yellow yellow   Clarity, UA cloudy (A) clear   Glucose, UA negative negative   Bilirubin, UA small (A) negative   Bilirubin, UA small (15) (A) negative   Spec Grav, UA >=1.030 1.005 - 1.03   Blood, UA moderate (A) negative   pH, UA 5.5 5 - 8   Protein Ur, POC =30 (A) negative   Urobilinogen, UA 0.2 0 - 1   Nitrite, UA Negative Negative   Leukocytes, UA Negative Negative      Imaging Review No results found.   MDM   1. Dental abscess   2. Dysuria   3. Amenorrhea    Treatment options discussed, as well as risks, benefits, alternatives. Patient voiced understanding and agreement with the following plans: 1) for dental abscess, Augmentin 875 twice a day prescribed. Vicodin prescribed as needed for severe acute pain. Keep appointment with oral surgeon tomorrow. 2) she states dysuria resolved a couple days ago, although she may have a residual UTI. We'll send off urine culture. Augmentin would cover the UTI if needed. 3) amenorrhea. explained that urine pregnancy test negative. Questions invited and answered. Advised to follow-up with gynecologist.    Jacqulyn Cane, MD 01/19/15 1634  Jacqulyn Cane, MD 01/19/15 [phone]

## 2015-01-19 NOTE — ED Notes (Signed)
Pt c/o possible tooth infection with oral swelling and pain. Apt with oral surgeon tomorrow, she is here because the pain is becoming unbearable.

## 2015-01-21 LAB — URINE CULTURE: Colony Count: >=100000

## 2015-01-22 ENCOUNTER — Telehealth: Payer: Self-pay | Admitting: Emergency Medicine

## 2015-02-04 IMAGING — CR DG ABDOMEN 2V
3 series · 3 of 3 positions shown · non-contrast
Comparison: None.

CLINICAL DATA: Abdominal pain, diarrhea constipation, history of
irritable bowel syndrome

ABDOMEN - 2 VIEW

[view not recorded (1 of 3)]
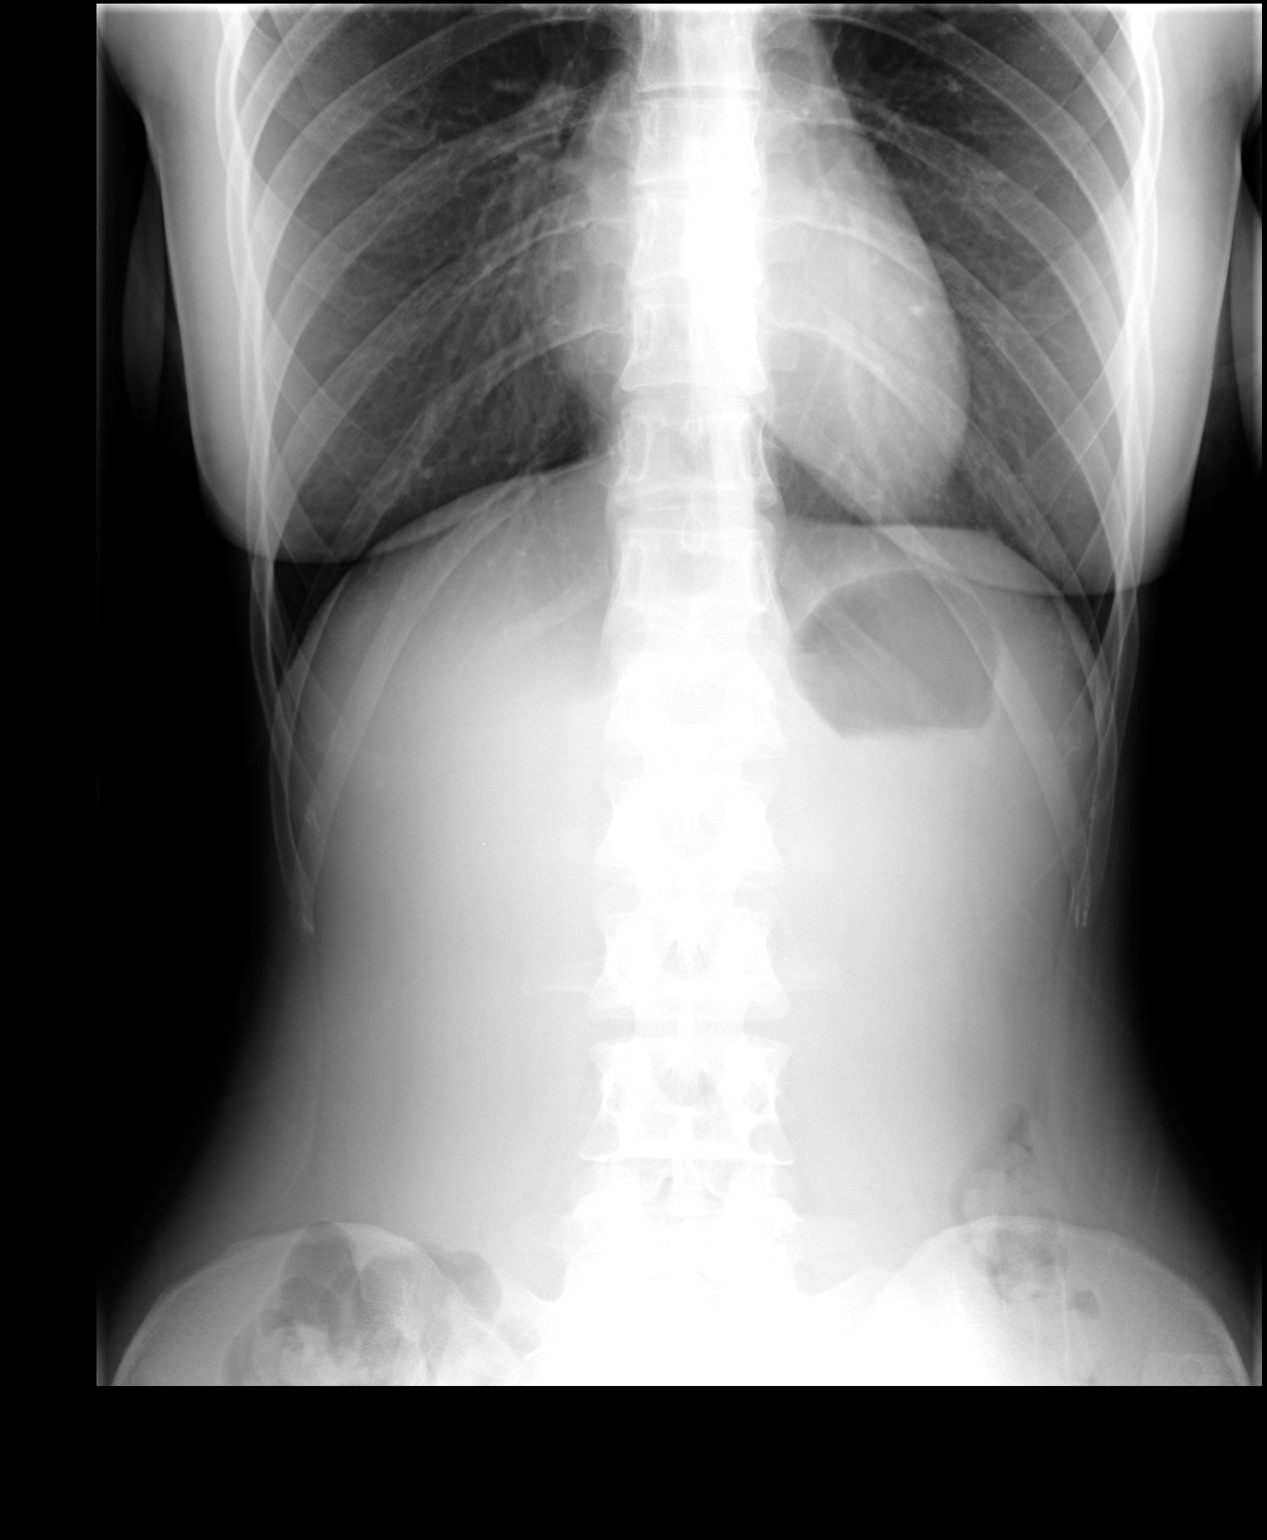

[view not recorded (2 of 3)]
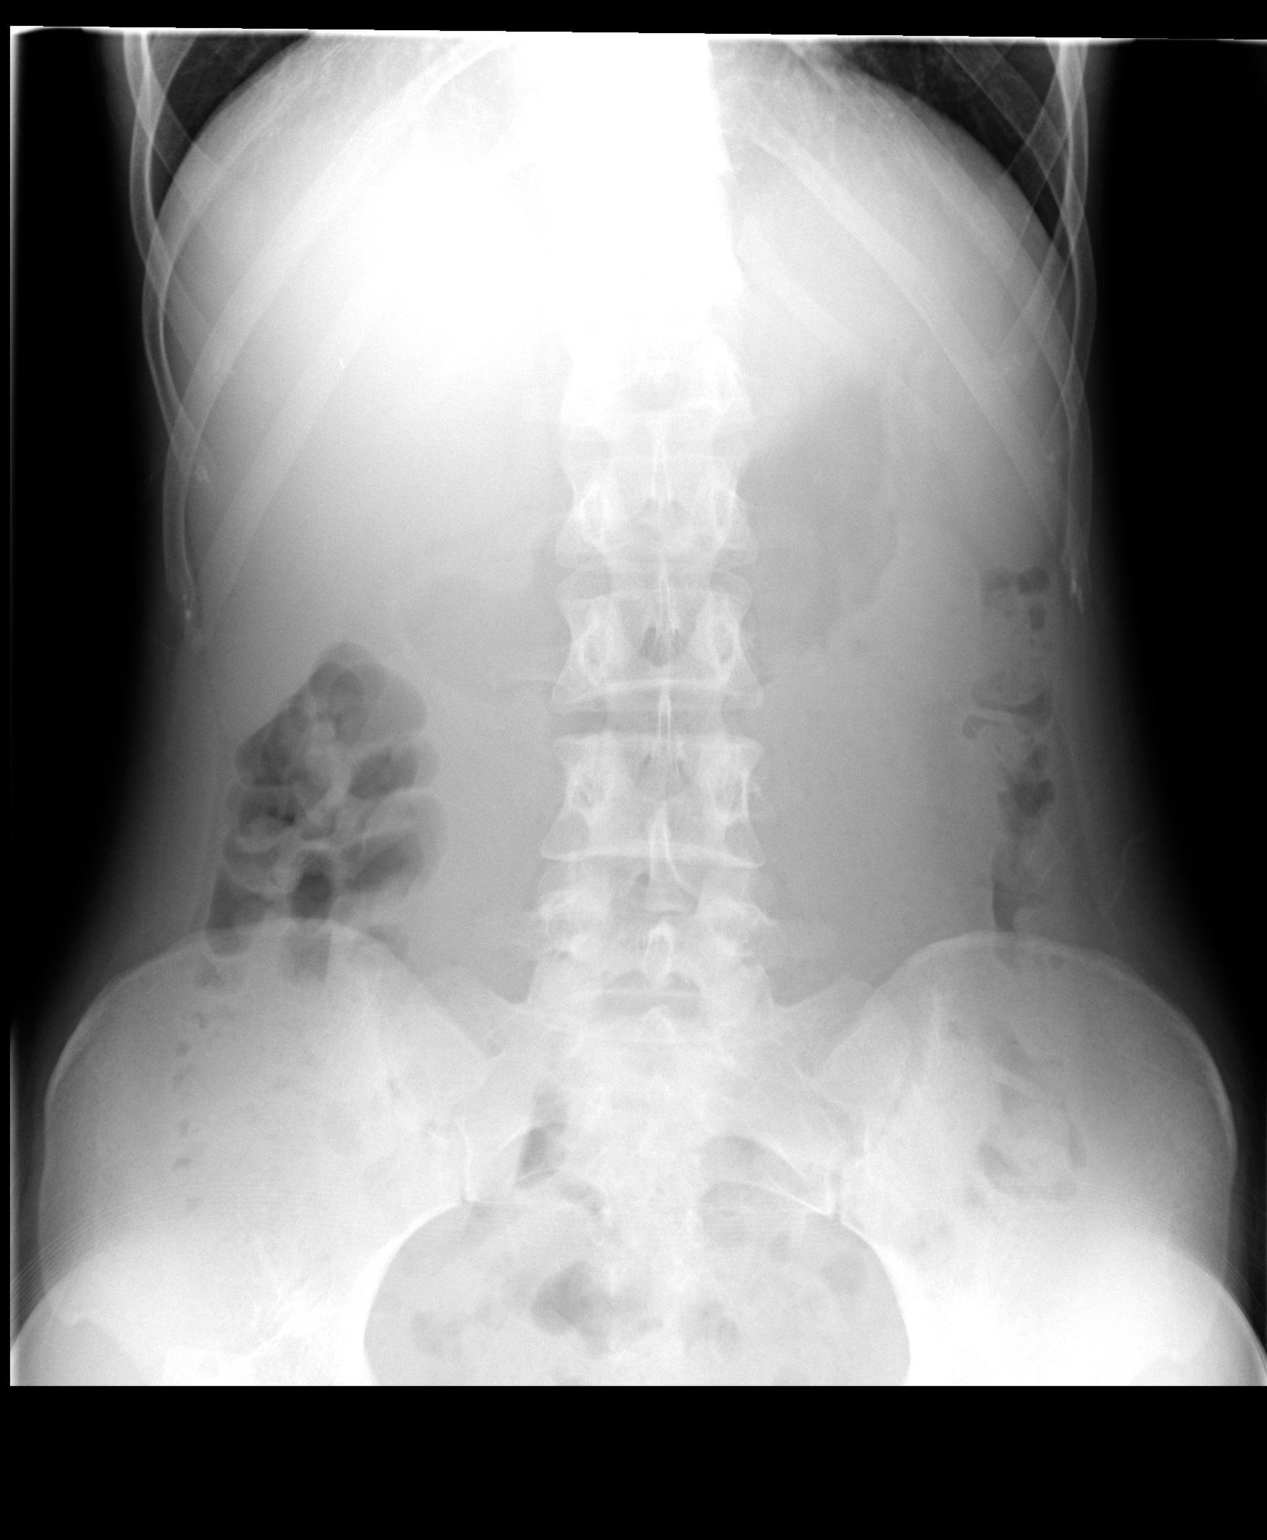

[view not recorded (3 of 3)]
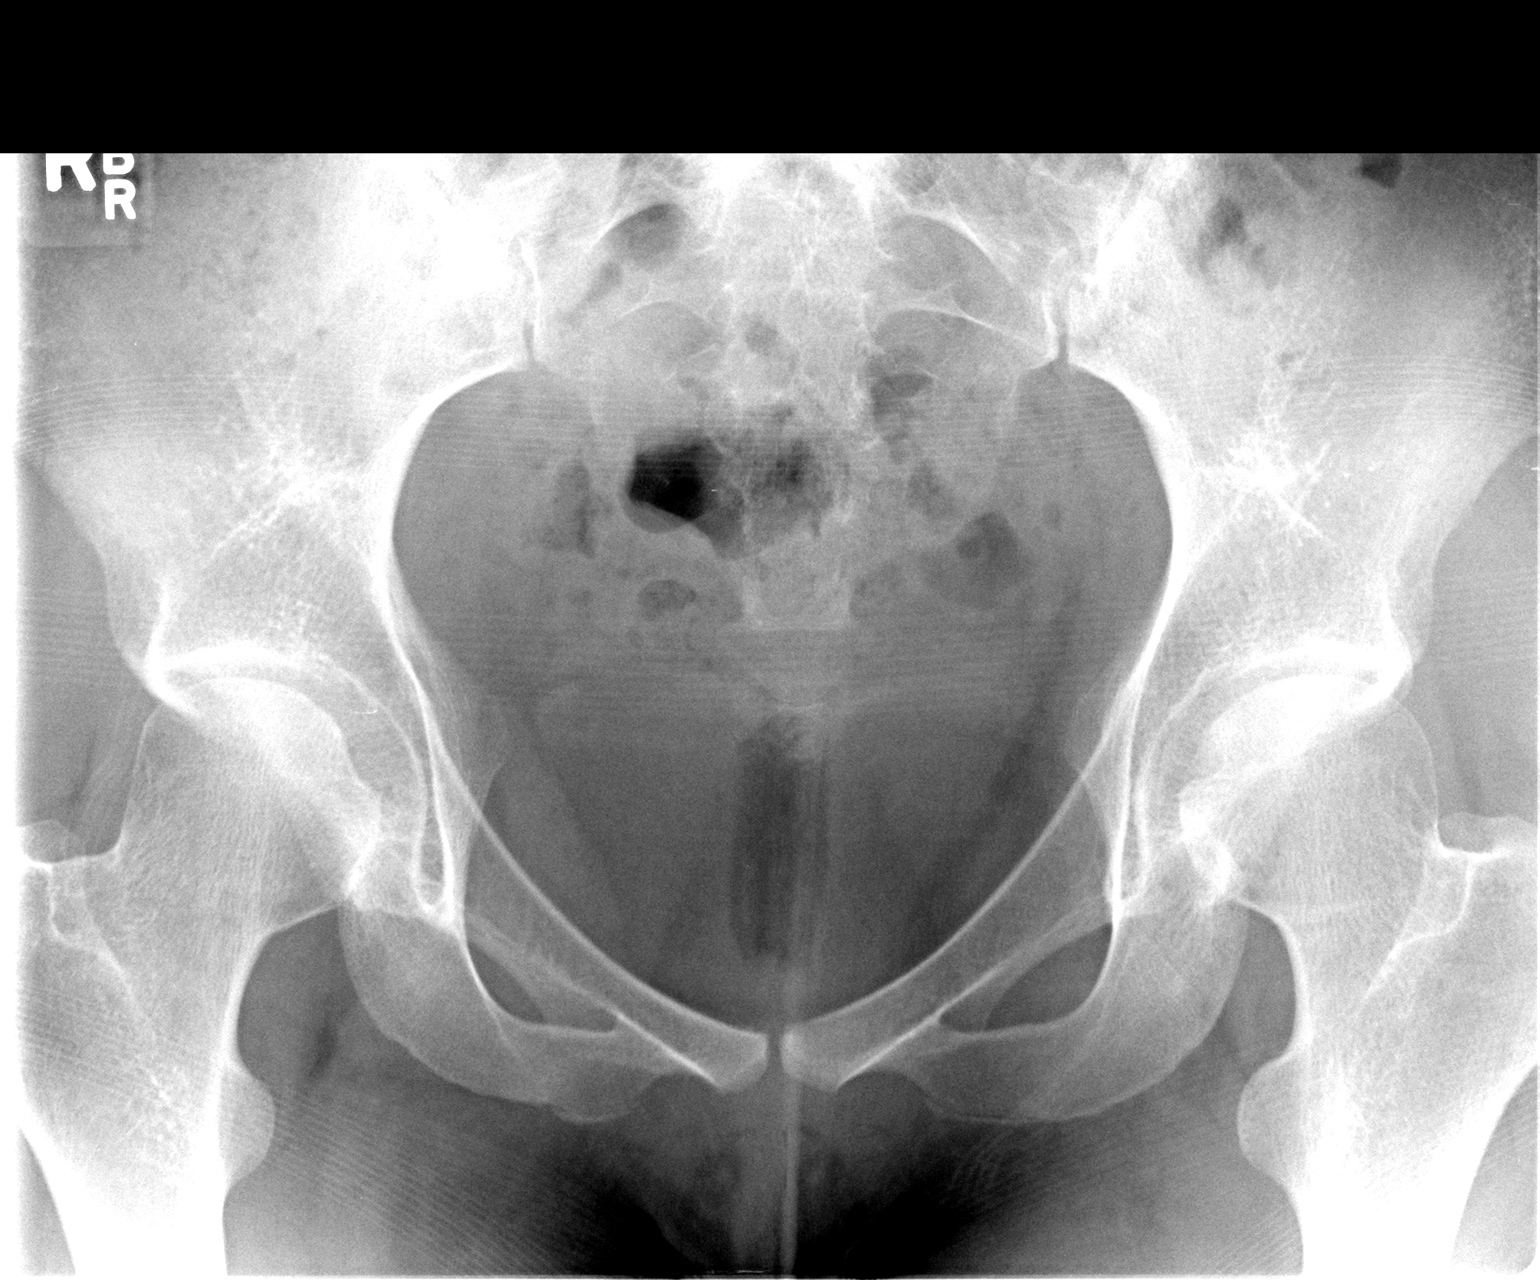

[3 of 3 positions shown; findings below may reference images not displayed]

FINDINGS: Supine and erect views the abdomen show a nonspecific
bowel gas pattern.  No opaque calculi are noted.  No free air is
seen.  No significant constipation is evident by plain film.
IMPRESSION: No bowel obstruction.  No free air.  No plain film evidence of
constipation is noted.

## 2015-06-30 ENCOUNTER — Ambulatory Visit (INDEPENDENT_AMBULATORY_CARE_PROVIDER_SITE_OTHER): Payer: BLUE CROSS/BLUE SHIELD | Admitting: Physician Assistant

## 2015-06-30 ENCOUNTER — Encounter: Payer: Self-pay | Admitting: Physician Assistant

## 2015-06-30 VITALS — BP 144/64 | HR 95 | Ht 65.0 in | Wt 130.0 lb

## 2015-06-30 DIAGNOSIS — L409 Psoriasis, unspecified: Secondary | ICD-10-CM

## 2015-06-30 DIAGNOSIS — G43001 Migraine without aura, not intractable, with status migrainosus: Secondary | ICD-10-CM | POA: Diagnosis not present

## 2015-06-30 HISTORY — DX: Psoriasis, unspecified: L40.9

## 2015-06-30 MED ORDER — CLOBETASOL PROPIONATE 0.05 % EX FOAM: Freq: Two times a day (BID) | CUTANEOUS | Status: DC

## 2015-06-30 MED ORDER — AMITRIPTYLINE HCL 25 MG PO TABS: 25.0000 mg | ORAL_TABLET | Freq: Every day | ORAL | Status: DC

## 2015-06-30 MED ORDER — TRIAMCINOLONE ACETONIDE 0.1 % EX CREA: 1.0000 "application " | TOPICAL_CREAM | Freq: Two times a day (BID) | CUTANEOUS | Status: DC

## 2015-06-30 NOTE — Progress Notes (Signed)
   Subjective:    Patient ID: Ashley Watson, female    DOB: Oct 26, 1990, 24 y.o.   MRN: [id number]  HPI Pt presents to the clinic with itchy scalp. She wants to make sure it's not lice. No hx but itches that bad. Has been worse of the last few weeks. Her roomates scalp is itchy too so she wanted to make sure.   Migraines continue. Seem to be more frequent and almost daily. immitrex has not helped as a rescue. She does not remember topamax helping either. Pt not aware of any trigger. She has had some vision changes and getting glasses in next few weeks. No nausea or vomiting. HA's start on right side and then float to different areas of her head.    Review of Systems  All other systems reviewed and are negative.      Objective:   Physical Exam  Constitutional: She is oriented to person, place, and time. She appears well-developed and well-nourished.  HENT:  Head: Normocephalic and atraumatic.    Eyes: EOM are normal. Pupils are equal, round, and reactive to light.  Cardiovascular: Normal rate, regular rhythm and normal heart sounds.   Pulmonary/Chest: Effort normal and breath sounds normal.  Neurological: She is alert and oriented to person, place, and time. No cranial nerve deficit. Coordination normal.  Psychiatric: She has a normal mood and affect. Her behavior is normal.          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 reassured not lice. Triamcinolone given for any future skin lesions. Olux given as foam for scalp. Follow up as needed.   Migraines- failed topamax as a preventative. Given amitriptyline at bedtime. Follow up with PCP or neurologist.  Hopefully getting glasses will help. Pt declined rescue. For not try excedrin migraine.

## 2016-02-15 ENCOUNTER — Encounter: Payer: Self-pay | Admitting: Obstetrics & Gynecology

## 2016-02-15 ENCOUNTER — Ambulatory Visit (INDEPENDENT_AMBULATORY_CARE_PROVIDER_SITE_OTHER): Payer: BLUE CROSS/BLUE SHIELD | Admitting: Obstetrics & Gynecology

## 2016-02-15 VITALS — BP 130/80 | HR 94 | Resp 16 | Ht 65.0 in | Wt 130.0 lb

## 2016-02-15 DIAGNOSIS — Z113 Encounter for screening for infections with a predominantly sexual mode of transmission: Secondary | ICD-10-CM

## 2016-02-15 DIAGNOSIS — Z124 Encounter for screening for malignant neoplasm of cervix: Secondary | ICD-10-CM

## 2016-02-15 DIAGNOSIS — Z01419 Encounter for gynecological examination (general) (routine) without abnormal findings: Secondary | ICD-10-CM | POA: Diagnosis not present

## 2016-02-15 NOTE — Progress Notes (Signed)
Subjective:    Ashley Watson is a 25 y.o. SW G67 female who presents for an annual exam. The patient has no complaints today. The patient is sexually active. GYN screening history: last pap: was normal. The patient wears seatbelts: yes. The patient participates in regular exercise: yes. Has the patient ever been transfused or tattooed?: yes. The patient reports that there is not domestic violence in her life.   Menstrual History: OB History    Gravida Para Term Preterm AB TAB SAB Ectopic Multiple Living   0 0 0 0 0 0 0 0 0 0       Menarche age: 3  Patient's last menstrual period was 01/25/2016.    The following portions of the patient's history were reviewed and updated as appropriate: allergies, current medications, past family history, past medical history, past social history, past surgical history and problem list.  Review of Systems Pertinent items are noted in HPI.  Monogamous for 2 years. Works for [employer] in [place], "I listen to first world problems". No gyn, breast, colon cancer. Uses condoms. Would not be upset with a pregnancy.   Objective:    BP 130/80 mmHg  Pulse 94  Resp 16  Ht 5\' 5"  (1.651 m)  Wt 130 lb (58.968 kg)  BMI 21.63 kg/m2  LMP 01/25/2016  General Appearance:    Alert, cooperative, no distress, appears stated age  Head:    Normocephalic, without obvious abnormality, atraumatic  Eyes:    PERRL, conjunctiva/corneas clear, EOM's intact, fundi    benign, both eyes  Ears:    Normal TM's and external ear canals, both ears  Nose:   Nares normal, septum midline, mucosa normal, no drainage    or sinus tenderness  Throat:   Lips, mucosa, and tongue normal; teeth and gums normal  Neck:   Supple, symmetrical, trachea midline, no adenopathy;    thyroid:  no enlargement/tenderness/nodules; no carotid   bruit or JVD  Back:     Symmetric, no curvature, ROM normal, no CVA tenderness  Lungs:     Clear to auscultation bilaterally, respirations unlabored   Chest Wall:    No tenderness or deformity   Heart:    Regular rate and rhythm, S1 and S2 normal, no murmur, rub   or gallop  Breast Exam:    No tenderness, masses, or nipple abnormality  Abdomen:     Soft, non-tender, bowel sounds active all four quadrants,    no masses, no organomegaly  Genitalia:    Normal female without lesion, discharge or tenderness     Extremities:   Extremities normal, atraumatic, no cyanosis or edema  Pulses:   2+ and symmetric all extremities  Skin:   Skin color, texture, turgor normal, no rashes or lesions  Lymph nodes:   Cervical, supraclavicular, and axillary nodes normal  Neurologic:   CNII-XII intact, normal strength, sensation and reflexes    throughout  .    Assessment:    Healthy female exam.    Plan:     Chlamydia specimen. GC specimen. Thin prep Pap smear.   Rec MVI daily

## 2016-02-17 LAB — CYTOLOGY - PAP

## 2016-02-29 ENCOUNTER — Ambulatory Visit (INDEPENDENT_AMBULATORY_CARE_PROVIDER_SITE_OTHER): Payer: BLUE CROSS/BLUE SHIELD | Admitting: Osteopathic Medicine

## 2016-02-29 ENCOUNTER — Encounter: Payer: Self-pay | Admitting: Osteopathic Medicine

## 2016-02-29 VITALS — BP 130/73 | HR 82 | Ht 65.0 in | Wt 133.0 lb

## 2016-02-29 DIAGNOSIS — J029 Acute pharyngitis, unspecified: Secondary | ICD-10-CM | POA: Diagnosis not present

## 2016-02-29 DIAGNOSIS — B9789 Other viral agents as the cause of diseases classified elsewhere: Principal | ICD-10-CM

## 2016-02-29 DIAGNOSIS — J069 Acute upper respiratory infection, unspecified: Secondary | ICD-10-CM

## 2016-02-29 LAB — POCT RAPID STREP A (OFFICE): Rapid Strep A Screen: NEGATIVE

## 2016-02-29 MED ORDER — IPRATROPIUM BROMIDE 0.03 % NA SOLN: 2.0000 | Freq: Three times a day (TID) | NASAL | 0 refills | Status: DC | PRN

## 2016-02-29 MED ORDER — BENZONATATE 200 MG PO CAPS: 200.0000 mg | ORAL_CAPSULE | Freq: Three times a day (TID) | ORAL | 0 refills | Status: DC | PRN

## 2016-02-29 NOTE — Progress Notes (Signed)
HPI: Ashley Watson is a 25 y.o. female who presents to [facility]  today for chief complaint of:  Chief Complaint  Patient presents with  . Sore Throat    . Location: L side of neck/throat  . Quality: scratchy feeling . Assoc signs/symptoms: L sided neck sore, L ear fullness . Duration: 1 days . Modifying factors: has tried the following OTC/Rx medications: Ibuprofen helps a bit    Past medical, social and family history reviewed. Current medications and allergies reviewed.     Review of Systems: CONSTITUTIONAL: chills fever/chills HEAD/EYES/EARS/NOSE/THROAT: no headache, no vision change or hearing change, yes sore throat CARDIAC: No chest pain, No pressure/palpitations RESPIRATORY: yes cough, no shortness of breath GASTROINTESTINAL: yes nausea, no vomiting, no abdominal pain, no diarrhea MUSCULOSKELETAL: no myalgia/arthralgia SKIN: no rash   Exam:  BP 130/73   Pulse 82   Ht 5\' 5"  (1.651 m)   Wt 133 lb (60.3 kg)   LMP 01/25/2016   BMI 22.13 kg/m  Constitutional: VSS, see above. General Appearance: alert, well-developed, well-nourished, NAD Eyes: Normal lids and conjunctive, non-icteric sclera, PERRLA Ears, Nose, Mouth, Throat: Normal external inspection ears/nares/mouth/lips/gums, normal TM, MMM;       posterior pharynx without erythema, without exudate, nasal mucosa normal, no protrusion of tonsil/pharyngeal tissue Neck: No masses, trachea midline, mild tender LN on L but no significant enlargement Respiratory: Normal respiratory effort. No  wheeze/rhonchi/rales Cardiovascular: S1/S2 normal, no murmur/rub/gallop auscultated. RRR.   Results for orders placed or performed in visit on 02/29/16 (from the past 72 hour(s))  POCT rapid strep A     Status: None   Collection Time: 02/29/16  2:37 PM  Result Value Ref Range   Rapid Strep A Screen Negative Negative   No results found.   ASSESSMENT/PLAN:   Viral URI with cough -  Plan: ipratropium (ATROVENT) 0.03 % nasal spray, benzonatate (TESSALON) 200 MG capsule  Sore throat - Plan: POCT rapid strep A  Viral pharyngitis    Visit summary was printed for the patient with medications and pertinent instructions for patient to review. ER/RTC precautions reviewed. All questions answered. Return if symptoms worsen or fail to improve.

## 2016-02-29 NOTE — Patient Instructions (Signed)
DR. Danessa Mensch'S HOME CARE INSTRUCTIONS: VIRAL ILLNESSES - ACHES/PAINS, SORE THROAT, SINUSITIS, COUGH, GASTRITIS   FIRST, A FEW NOTES ON OVER-THE-COUNTER MEDICATIONS!  USE CAUTION - MANY OVER-THE-COUNTER MEDICATIONS COME IN COMBINATIONS OF MULTIPLE GENERIC MEDICINES. FOR INSTANCE, NYQUIL HAS TYLENOL + COUGH MEDICINE + AN ANTIHISTAMINE, SO BE CAREFUL YOU'RE NOT TAKING A COMBINATION MEDICINE WHICH CONTAINS MEDICATIONS YOU'RE ALSO TAKING SEPARATELY (LIKE NYQUIL SYRUP PLUS TYLENOL PILLS).  YOUR PHARMACIST CAN HELP YOU AVOID MEDICATION INTERACTIONS AND DUPLICATIONS - ASK FOR THEIR HELP IF YOU ARE CONFUSED OR UNSURE ABOUT WHAT TO PURCHASE OVER-THE-COUNTER!  REMEMBER - IF YOU'RE EVER CONCERNED ABOUT MEDICATION SIDE EFFECTS, OR IF YOU'RE EVER CONCERNED YOUR SYMPTOMS ARE GETTING WORSE DESPITE TREATMENT, PLEASE CALL THE DOCTOR'S OFFICE! IF AFTER-HOURS, YOU CAN BE SEEN IN URGENT CARE OR, FOR SEVERE ILLNESS, PLEASE GO TO THE EMERGENCY ROOM.   TREAT SINUS CONGESTION, RUNNY NOSE & POSTNASAL DRIP: Treat to increase airflow through sinuses, decrease congestion pain and prevent bacterial growth!  Remember, only 0.5-2% of sinus infections are due to a bacteria, the rest are due to a virus (usually the common cold) which will not get better with antibiotics!  NASAL SPRAYS: generally safe and should not interact with other medicines. Can take any of these medications, either alone or together... FLONASE (FLUTICASONE) - 2 sprays in each nostril twice per day (also a great allergy medicine to use long-term!) AFRIN (OXYMETOLAZONE) - use sparingly because it will cause rebound congestion, NEVER USE IN KIDS  SALINE NASAL SPRAY- no limit, but avoid use after other nasal sprays or it can wash the medicine away PRESCRTIPTION ATROVENT - as directed on prescription bottle  ANTIHISTAMINES: Helps dry out runny nose and decreases postnasal drip. Benadryl may cause drowsiness but other preparations should not be as sedating.  Certain kinds are not as safe in elderly individuals. OK to use unless Dr A says otherwise.  Only use one of the following... BENADRYL (DIPHENHYDRAMINE) - 25-50 mg every 6 hours ZYRTEC (CETIRIZINE) - 5-10 mg daily CLARITIN (LORATIDINE) - less potent. 10 mg daily ALLEGRA (FEXOFENADINE) - least likely to cause drowsiness! 180 mg daily or 60 mg twice per day  DECONGESTANTS: Helps dry out runny nose and helps with sinus pain. May cause insomnia, or sometimes elevated heart rate. Can cause problems if used often in people with high blood pressure. OK to use unless Dr A says otherwise. NEVER USE IN KIDS UNDER 2 YEARS OLD. Only use one of the following... SUDAFED (PSEUDOEPHEDINE) - 60 mg every 4 - 6 hours, also comes in 120 mg extended release every 12 hours, maximum 240 mg in 24 hours.  SUDAED PE (PHENYLEPHRINE) - 10 mg every 4 - 6 hours, maximum 60 mg per day  COMBINATIONS OF ABOVE ANTIHISTAMINES + DECONGESTANTS: these usually require you to show your ID at the pharmacy counter. You can also purchase these medicines separately as noted above.  Only use one of the following... ZYRTEC-D (CETIRIZINE + PSEUDOEPHEDRINE) - 12 hour formulation as directed CLARITIN-D (LORATIDINE + PSEUDOEPHEDRINE) - 12 and 24 hour formulations as directed ALLEGRA-D (FEXOFENADINE + PSEUDOEPHEDRINE) - 12 and 24 hour formulations as directed  PRESCRIPTION TREATMENT FOR SINUS PROBLEMS: Can include nasal sprays, pills, or antibiotics in the case of true bacterial infection. Not everyone needs an antibiotic but there are other medicines which will help you feel better while your body fights the infection!    TREAT COUGH & SORE THROAT: Remember, cough is the body's way of protecting your airways and lungs, it's a hard-wired   reflex that is tough for medicines to treat!  Irritation to the airways will cause cough. This irritation is usually caused by upper airway problems like postnasal drip (treat as above) and viral sore  throat, but in severe cases can be due to lower airway problems like bronchitis or pneumonia, which a doctor can usually hear on exam of your lungs or see on an X-ray. Sore throat is almost always due to a virus, but occasionally caused by certain strains of Strep, which requires antibiotics.  Exercise and smoking may make cough worse - take it easy while you're sick, and QUIT SMOKING!  Cough due to viral infection can linger for 2-3 weeks or so. If you're coughing longer than you think you should, or if the cough is severe, please make an appointment in the office - you may need a chest X-ray.  EXPECTORANT: Used to help clear airways, take these with PLENTY of water to help thin mucus secretions and make the mucus easier to cough up  Only use one of the following... ROBITUSSIN (DEXTROMETHORPHAN OR GUAIFENISEN depending on formulation)  MUCINEX (GUAIFENICEN) - usually longer acting  COUGH DROPS/LOZENGES: Whichever over-the-counter agent you prefer!  Here are some suggestions for ingredients to look for (can take both)... BENZOCAINE - numbing effect, also in CHLORASEPTIC THROAT SPRAY MENTHOL - cooling effect  HONEY: has gone head-to-head in several clinical trials with prescription cough medicines and found to be equally effective! Try 1 Teaspoon Honey + 2 Drops Lemon Juice, as much as you want to use. NONE FOR KIDS UNDER AGE 2  HERBAL TEA: There are certain ingredients which help "coat the throat" to relieve pain  such as ELM BARK, LICORICE ROOT, MARSHMALLOW ROOT  PRESCRIPTION TREATMENT FOR COUGH: Reserved for severe cases. Can include pills, syrups or inhalers.    TREAT ACHES & PAINS, FEVER: Illness causes aches, pains and fever as your body increases its natural inflammation response to help fight the infection.  Rest, good hydration and nutrition, and taking anti-inflammatory medications will help.  Remember: a true fever is a temperature 100.4 or higher. If you have a fever that  is 105.0 or higher, that is a dangerous level and needs medical attention in the office or in the ER!  Can take both of these together if it's ok with your doctor... IBUPROFEN - 400-800 mg every 6 - 8 hours. Ibuprofen and similar medications can cause problems for people with heart disease or history of stomach ulcers, check with Dr A first if you're concerned. Lower doses are usually safe and effective.  TYLENOL (ACETAMINOPHEN) - 500-1000 mg every 6 hours. It won't cause problems with heart or stomach.     TREAT GASTROINTESTINAL SYMPTOMS: Hydrate, hydrate, hydrate! Try drinking Gatorade/Powerade, broth/soup. Avoid milk and juice, these can make diarrhea worse. You can drink water, of course, but if you are also having vomiting and loose stool you are losing electrolytes which water alone can't replace.  IMMODIUM (LOPERAMIDE) - as directed on the bottle to help with loose stool PRESCRIPTION ZOFRAN (ONDANSETRON) OR PHENERGAN (PROMETHAZINE) - as directed to help nausea and vomiting. Try taking these before you eat if you are having trouble keeping food down.     REMEMBER - THE MOST IMPORTANT THINGS YOU CAN DO TO AVOID CATCHING OR SPREADING ILLNESS INCLUDE:  COVER YOUR COUGH WITH YOUR ARM, NOT WITH YOUR HANDS!  DISINFECT COMMONLY USED SURFACES (SUCH AS TELEPHONES & DOORKNOBS) WHEN YOU OR SOMEONE CLOSE TO YOU IS FEELING SICK!  BE SURE   VACCINES ARE UP TO DATE - GET A FLU SHOT EVERY YEAR! THE FLU CAN BE DEADLY FOR BABIES, ELDERLY FOLKS, AND PEOPLE WITH WEAK IMMUNE SYSTEMS - YOU SHOULD BE VACCINATED TO HELP PREVENT YOURSELF FROM GETTING SICK, BUT ALSO TO PREVENT SOMEONE ELSE FROM GETTING AN INFECTION WHICH MAY HOSPITALIZE OR KILL THEM. GOOD NUTRITION AND HEALTHY LIFESTYLE WILL HELP YOUR IMMUNE SYSTEM YEAR-ROUND! THERE IS NO MAGIC SUPPLEMENT! AND ABOVE ALL - WASH YOUR HANDS OFTEN!  

## 2016-04-26 ENCOUNTER — Encounter: Payer: Self-pay | Admitting: *Deleted

## 2016-04-26 ENCOUNTER — Emergency Department
Admission: EM | Admit: 2016-04-26 | Discharge: 2016-04-26 | Disposition: A | Discharge status: Home / Self Care | Payer: BLUE CROSS/BLUE SHIELD | Source: Home / Self Care | Attending: Emergency Medicine | Admitting: Emergency Medicine

## 2016-04-26 DIAGNOSIS — R1033 Periumbilical pain: Secondary | ICD-10-CM

## 2016-04-26 DIAGNOSIS — A084 Viral intestinal infection, unspecified: Secondary | ICD-10-CM | POA: Diagnosis not present

## 2016-04-26 DIAGNOSIS — R110 Nausea: Secondary | ICD-10-CM

## 2016-04-26 LAB — POCT INFLUENZA A/B
Influenza A, POC: NEGATIVE
Influenza B, POC: NEGATIVE

## 2016-04-26 LAB — POCT RAPID STREP A (OFFICE): Rapid Strep A Screen: NEGATIVE

## 2016-04-26 LAB — POCT URINE PREGNANCY: Preg Test, Ur: NEGATIVE

## 2016-04-26 MED ORDER — HYOSCYAMINE SULFATE 0.125 MG PO TABS: ORAL_TABLET | ORAL | 0 refills | Status: DC

## 2016-04-26 MED ORDER — ONDANSETRON 4 MG PO TBDP
4.0000 mg | ORAL_TABLET | Freq: Once | ORAL | Status: AC
  Administered 2016-04-26: 4 mg via ORAL

## 2016-04-26 NOTE — ED Provider Notes (Signed)
[facility]    CSN: [id number] Arrival date & time: 04/26/16  1123     History   Chief Complaint Chief Complaint  Patient presents with  . Nausea    HPI Ashley Watson is a 25 y.o. female.   HPI Pt c/o nausea, diarrhea, and abd cramping x 2 days. Denies vomiting. Reports episodes of hot/cold, but is unsure if she has had a fever.  Also complains of sore throat. Myalgias and arthralgias. No swollen joints. Diffuse abdominal cramping. The abdominal pain is mostly periumbilical It does not radiate to her back. Some loose watery stools twice a day. Last menstrual period ended 10 days ago Denies GYN symptoms. No discharge.  complains of fatigue, but no focal neurologic symptoms. No syncope or rash. Tolerating small amount of by mouth clear liquids Past Medical History:  Diagnosis Date  . Migraines   . Seizures [facility])     Patient Active Problem List   Diagnosis Date Noted  . 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 Family planning 08/17/2014  . Dysuria 08/17/2014  . Black-out (not amnesia) 12/30/2013  . Weight loss 04/11/2013  . Low back pain 11/25/2012  . Irritable bowel syndrome 10/15/2012  . Preventive measure 04/19/2012  . Migraine without aura 04/11/2007    History reviewed. No pertinent surgical history.  OB History    Gravida Para Term Preterm AB Living   0 0 0 0 0 0   SAB TAB Ectopic Multiple Live Births   0 0 0 0         Home Medications    Prior to Admission medications   Medication Sig Start Date End Date Taking? Authorizing Provider  amitriptyline (ELAVIL) 25 MG tablet Take 1 tablet (25 mg total) by mouth at bedtime. 06/30/15   Jade L Breeback, PA-C  clobetasol (OLUX) 0.05 % topical foam Apply topically 2 (two) times daily. 06/30/15   Donella Stade, PA-C  hyoscyamine (LEVSIN, ANASPAZ) 0.125 MG tablet Take one by mouth every 4-6 hours as needed for abdominal cramping or pain 04/26/16   Jacqulyn Cane, MD  ipratropium (ATROVENT) 0.03 % nasal  spray Place 2 sprays into both nostrils 3 (three) times daily as needed for rhinitis. 02/29/16   Emeterio Reeve, DO  triamcinolone cream (KENALOG) 0.1 % Apply 1 application topically 2 (two) times daily. 06/30/15   Donella Stade, PA-C    Family History Family History  Problem Relation Age of Onset  . Diabetes Father   . Hypertension Father     Social History Social History  Substance Use Topics  . Smoking status: Never Smoker  . Smokeless tobacco: Never Used  . Alcohol use 3.6 oz/week    6 Cans of beer per week     Comment: 2     Allergies   Latex   Review of Systems Review of Systems  All other systems reviewed and are negative. no urinary symptoms   Physical Exam Triage Vital Signs ED Triage Vitals  Enc Vitals Group     BP 04/26/16 1142 121/68     Pulse Rate 04/26/16 1142 87     Resp 04/26/16 1142 16     Temp 04/26/16 1142 98 F (36.7 C)     Temp Source 04/26/16 1142 Oral     SpO2 04/26/16 1142 97 %     Weight 04/26/16 1143 131 lb (59.4 kg)     Height 04/26/16 1143 5\' 5"  (1.651 m)     Head Circumference --  Peak Flow --      Pain Score 04/26/16 1143 0     Pain Loc --      Pain Edu? --      Excl. in [place]? --    No data found.   Updated Vital Signs BP 121/68 (BP Location: Left Arm)   Pulse 87   Temp 98 F (36.7 C) (Oral)   Resp 16   Ht 5\' 5"  (1.651 m)   Wt 131 lb (59.4 kg)   SpO2 97%   BMI 21.80 kg/m   Visual Acuity Right Eye Distance:   Left Eye Distance:   Bilateral Distance:    Right Eye Near:   Left Eye Near:    Bilateral Near:     Physical Exam  Constitutional: She appears well-developed and well-nourished.  Non-toxic appearance. She appears ill (very fatigued, but no cardiorespiratory distress). No distress.  HENT:  Head: Normocephalic and atraumatic.  Right Ear: Tympanic membrane and external ear normal.  Left Ear: Tympanic membrane and external ear normal.  Nose: Rhinorrhea present.  Mouth/Throat: Mucous membranes are  normal. Posterior oropharyngeal erythema (mild redness ) present. No oropharyngeal exudate.  Eyes: Conjunctivae are normal. Right eye exhibits no discharge. Left eye exhibits no discharge. No scleral icterus.  Neck: Neck supple.  Cardiovascular: Normal rate, regular rhythm and normal heart sounds.   Pulmonary/Chest: Breath sounds normal. No stridor. No respiratory distress. She has no wheezes. She has no rales.  Abdominal: Soft. She exhibits no distension. There is tenderness.  abdomen soft, mild periumbilical tenderness without specific masses, guarding, or rebound or hepatosplenomey  Musculoskeletal: She exhibits no edema.  Lymphadenopathy:    She has cervical adenopathy (mild shoddy anterior cervical nodes).  Neurological: She is alert.  Skin: Skin is warm and intact. No rash noted. She is diaphoretic.  Psychiatric: She has a normal mood and affect.  Nursing note and vitals reviewed.    UC Treatments / Results  Labs (all labs ordered are listed, but only abnormal results are displayed) Labs Reviewed  POCT INFLUENZA A/B  POCT RAPID STREP A (OFFICE)  POCT URINE PREGNANCY  rapid strep test negative Flu tests a and B both negative Urine pregnancy test negative  EKG  EKG Interpretation None       Radiology No results found.  Procedures Procedures (including critical care time)  Medications Ordered in UC Medications  ondansetron (ZOFRAN-ODT) disintegrating tablet 4 mg (4 mg Oral Given 04/26/16 1144)   Patient reevaluated, nausea improved. She was able to tolerate 8 ounces of ice water here in urgent care.  Initial Impression / Assessment and Plan / UC Course  I have reviewed the triage vital signs and the nursing notes.  Pertinent labs & imaging results that were available during my care of the patient were reviewed by me and considered in my medical decision making (see chart for details).  Clinical Course  Comment By Time  History and physical obtained. Ordered  urine pregnancy test, flu test, and rapid strep test Jacqulyn Cane, MD 09/27 1316  Rapid strep test, urine pregnancy test, and flu tests A and B are all negative Jacqulyn Cane, MD 09/27 1407   Clinically, no evidence of acute abdomen. Likely has viral gastroenteritis. Could be component of irritable bowel which she has had in the past  Final Clinical Impressions(s) / UC Diagnoses   Final diagnoses:  Viral gastroenteritis  Periumbilical abdominal pain  Nausea    New Prescriptions Discharge Medication List as of 04/26/2016  2:18 PM  START taking these medications   Details  hyoscyamine (LEVSIN, ANASPAZ) 0.125 MG tablet Take one by mouth every 4-6 hours as needed for abdominal cramping or pain, Normal      push fluids, bland diet discussed. Other symptomatic care discussed Follow-up with your primary care doctor in 5-7 days if not improving, or sooner if symptoms become worse. Precautions discussed. Red flags discussed. Questions invited and answered. Patient voiced understanding and agreement.    Jacqulyn Cane, MD 05/04/16 [phone]

## 2016-04-26 NOTE — ED Triage Notes (Signed)
Pt c/o nausea, diarrhea, and abd cramping x 2 days. Denies vomiting. Reports episodes of hot/cold, but is unsure if she has had a fever.

## 2016-05-24 ENCOUNTER — Encounter: Payer: Self-pay | Admitting: Emergency Medicine

## 2016-05-24 ENCOUNTER — Ambulatory Visit: Payer: BLUE CROSS/BLUE SHIELD | Admitting: Family Medicine

## 2016-05-24 ENCOUNTER — Emergency Department (INDEPENDENT_AMBULATORY_CARE_PROVIDER_SITE_OTHER)
Admission: EM | Admit: 2016-05-24 | Discharge: 2016-05-24 | Disposition: A | Discharge status: Home / Self Care | Payer: BLUE CROSS/BLUE SHIELD | Source: Home / Self Care | Attending: Family Medicine | Admitting: Family Medicine

## 2016-05-24 DIAGNOSIS — G43109 Migraine with aura, not intractable, without status migrainosus: Secondary | ICD-10-CM | POA: Diagnosis not present

## 2016-05-24 MED ORDER — PREDNISONE 50 MG PO TABS
60.0000 mg | ORAL_TABLET | Freq: Once | ORAL | Status: AC
  Administered 2016-05-24: 60 mg via ORAL

## 2016-05-24 MED ORDER — SUMATRIPTAN SUCCINATE 50 MG PO TABS: 50.0000 mg | ORAL_TABLET | ORAL | 0 refills | Status: DC | PRN

## 2016-05-24 MED ORDER — KETOROLAC TROMETHAMINE 60 MG/2ML IM SOLN
30.0000 mg | Freq: Once | INTRAMUSCULAR | Status: AC
  Administered 2016-05-24: 30 mg via INTRAMUSCULAR

## 2016-05-24 MED ORDER — METOCLOPRAMIDE HCL 5 MG/ML IJ SOLN
5.0000 mg | Freq: Once | INTRAMUSCULAR | Status: AC
  Administered 2016-05-24: 5 mg via INTRAMUSCULAR

## 2016-05-24 NOTE — ED Provider Notes (Signed)
CSN: [id number]     Arrival date & time 05/24/16  0941 History   First MD Initiated Contact with Patient 05/24/16 1007     Chief Complaint  Patient presents with  . Migraine   (Consider location/radiation/quality/duration/timing/severity/associated sxs/prior Treatment) HPI Ashley Watson is a 25 y.o. female presenting to UC with c/o gradually worsening headache that is c/w prior migraines.  Pt notes pain is "traveling" from back to middle and frontal aspect of Right side of head.  Pain started last night but is worse today.  Pain is aching and throbbing, 7/10.  She has been trying peppermint oil, which provides minimal relief for pain, however, a friend told her about a "migraine cocktail" and she would like to try it.  She notes she has had prednisone shots in the past but notes they cause a lot of pain so she would like to avoid the prednisone shot but is open to other injections. She is also requesting a prescription for Imitrex and she believes she has taken in the past. She had tried to avoid taking prescription medications, however, due to having almost daily headaches, she would like to try something other than essential oils.  Denies nausea or vomiting. Mild photophobia. No recent head injury. She has been eating and drinking well. Denies fever or chills.    Past Medical History:  Diagnosis Date  . Migraines   . Seizures ([place])    History reviewed. No pertinent surgical history. Family History  Problem Relation Age of Onset  . Diabetes Father   . Hypertension Father    Social History  Substance Use Topics  . Smoking status: Never Smoker  . Smokeless tobacco: Never Used  . Alcohol use 3.6 oz/week    6 Cans of beer per week     Comment: 2   OB History    Gravida Para Term Preterm AB Living   0 0 0 0 0 0   SAB TAB Ectopic Multiple Live Births   0 0 0 0       Review of Systems  Constitutional: Negative for chills and fever.  HENT: Negative for congestion, ear pain and  sore throat.   Eyes: Positive for photophobia. Negative for pain, redness and visual disturbance.  Gastrointestinal: Positive for nausea. Negative for abdominal pain, constipation, diarrhea and vomiting.  Musculoskeletal: Negative for arthralgias, neck pain and neck stiffness.  Skin: Negative for rash.  Neurological: Positive for headaches. Negative for dizziness, seizures, syncope and light-headedness.    Allergies  Latex  Home Medications   Prior to Admission medications   Medication Sig Start Date End Date Taking? Authorizing Provider  SUMAtriptan (IMITREX) 50 MG tablet Take 1 tablet (50 mg total) by mouth every 2 (two) hours as needed for migraine. Do not take more than 4 doses in 24 hours 05/24/16   Noland Fordyce, PA-C   Meds Ordered and Administered this Visit   Medications  predniSONE (DELTASONE) tablet 60 mg (60 mg Oral Given 05/24/16 1024)  metoCLOPramide (REGLAN) injection 5 mg (5 mg Intramuscular Given 05/24/16 1024)  ketorolac (TORADOL) injection 30 mg (30 mg Intramuscular Given 05/24/16 1025)    BP 114/73 (BP Location: Left Arm)   Pulse 73   Temp 98.3 F (36.8 C) (Oral)   Ht 5\' 5"  (1.651 m)   Wt 133 lb (60.3 kg)   LMP 05/19/2016 (Exact Date)   SpO2 98%   BMI 22.13 kg/m  No data found.   Physical Exam  Constitutional: She is oriented  to person, place, and time. She appears well-developed and well-nourished. No distress.  Pt sitting in exam chair, NAD.  HENT:  Head: Normocephalic and atraumatic.  Eyes: Conjunctivae and EOM are normal. Pupils are equal, round, and reactive to light.  Neck: Normal range of motion. Neck supple.  No nuchal rigidity or meningeal signs.  Cardiovascular: Normal rate and regular rhythm.   Pulmonary/Chest: Effort normal. No respiratory distress.  Musculoskeletal: Normal range of motion.  Neurological: She is alert and oriented to person, place, and time.  CN II-XII in tact. Speech is clear. Alert to person, place, and time. Normal  coordination and gait.  Skin: Skin is warm and dry. She is not diaphoretic.  Psychiatric: She has a normal mood and affect. Her behavior is normal.  Nursing note and vitals reviewed.   Urgent Care Course   Clinical Course    Procedures (including critical care time)  Labs Review Labs Reviewed - No data to display  Imaging Review No results found.   MDM   1. Migraine with aura and without status migrainosus, not intractable     Pt c/o HA c/o prior migraines. Pt requesting a migraine cocktail. Discussed various combinations of medications and doses. Pt wants to avoid IM injections in buttock, will lower the dose of Toradol from normal 60mg  to 30mg  and have pt take oral prednisone 60mg .  Tx in UC: Toradol 30mg  IM, Prednisone 60mg  PO, and Reglan 5mg  IM.  Pt notes HA improved from 7/10 to 5/10 Pt feels comfortable being discharged home.  Rx: Imitrex 50mg  every 2 hours as needed for migraines (Max 200mg /day) Encouraged f/u with PCP and/or Headache Clinic in [place] for further evaluation and treatment of recurrent headaches. Patient verbalized understanding and agreement with treatment plan.  Work note provided for today 10/25, pt request to return tomorrow 10/26.     Noland Fordyce, PA-C 05/24/16 [phone]

## 2016-05-24 NOTE — ED Triage Notes (Signed)
Right side migraine started yesterday 7/10

## 2016-09-26 ENCOUNTER — Telehealth: Payer: Self-pay | Admitting: Sports Medicine

## 2016-09-26 NOTE — Telephone Encounter (Signed)
Pt declined flu shot °

## 2016-09-26 NOTE — Telephone Encounter (Signed)
Documented

## 2017-01-30 ENCOUNTER — Encounter: Payer: BLUE CROSS/BLUE SHIELD | Admitting: Obstetrics & Gynecology

## 2017-02-01 NOTE — Progress Notes (Signed)
dnka

## 2017-02-27 ENCOUNTER — Ambulatory Visit: Payer: BLUE CROSS/BLUE SHIELD | Admitting: Obstetrics & Gynecology

## 2017-02-28 ENCOUNTER — Ambulatory Visit (INDEPENDENT_AMBULATORY_CARE_PROVIDER_SITE_OTHER): Payer: BLUE CROSS/BLUE SHIELD | Admitting: Obstetrics & Gynecology

## 2017-02-28 ENCOUNTER — Encounter: Payer: Self-pay | Admitting: Obstetrics & Gynecology

## 2017-02-28 VITALS — BP 123/78 | HR 125 | Resp 16 | Ht 65.0 in | Wt 125.0 lb

## 2017-02-28 DIAGNOSIS — Z113 Encounter for screening for infections with a predominantly sexual mode of transmission: Secondary | ICD-10-CM | POA: Diagnosis not present

## 2017-02-28 DIAGNOSIS — N898 Other specified noninflammatory disorders of vagina: Secondary | ICD-10-CM | POA: Diagnosis not present

## 2017-02-28 DIAGNOSIS — N939 Abnormal uterine and vaginal bleeding, unspecified: Secondary | ICD-10-CM

## 2017-02-28 DIAGNOSIS — Z01419 Encounter for gynecological examination (general) (routine) without abnormal findings: Secondary | ICD-10-CM

## 2017-02-28 NOTE — Progress Notes (Signed)
Subjective:     Ashley Watson is a 26 y.o. female here for a routine exam.  Current complaints: worsening migraines.  Unable to get in with Dr. Darene Lamer.  Seeing urgent care doctors.  Pt has pain in back of neck (occiptal condyle).   Wants to see neurologist   Gynecologic History Patient's last menstrual period was 02/22/2017. Contraception: none Last Pap: 2017. Results were: normal Last mammogram: n/a.   Obstetric History OB History  Gravida Para Term Preterm AB Living  0 0 0 0 0 0  SAB TAB Ectopic Multiple Live Births  0 0 0 0           The following portions of the patient's history were reviewed and updated as appropriate: allergies, current medications, past family history, past medical history, past social history, past surgical history and problem list.  Review of Systems Pertinent items noted in HPI and remainder of comprehensive ROS otherwise negative.    Objective:      Vitals:   02/28/17 1038  BP: 123/78  Pulse: (!) 125  Resp: 16  Weight: 125 lb (56.7 kg)  Height: 5\' 5"  (1.651 m)   Vitals:  WNL General appearance: alert, cooperative and no distress  HEENT: Normocephalic, without obvious abnormality, atraumatic Eyes: negative Throat: lips, mucosa, and tongue normal; teeth and gums normal  Respiratory: Clear to auscultation bilaterally  CV: Regular rate and rhythm  Breasts:  Normal appearance, no masses or tenderness, no nipple retraction or dimpling  GI: Soft, non-tender; bowel sounds normal; no masses,  no organomegaly  GU: External Genitalia:  Tanner V, no lesion Urethra:  No prolapse   Vagina: Pink, normal rugae, no blood, some watery discharge  Cervix: No CMT, no lesion  Uterus:  Normal size and contour, non tender  Adnexa: Normal, no masses, non tender  Musculoskeletal: No edema, redness or tenderness in the calves or thighs  Skin: No lesions or rash  Lymphatic: Axillary adenopathy: none     Psychiatric: Normal mood and behavior    Assessment:     Healthy female exam.   Migraines Upcoming marriage October 2018   Plan:   Pap 6301 MVI with folic acid Pt declines contraception Gc/chlam screening; BD affirm Neuro referral

## 2017-03-02 LAB — CERVICOVAGINAL ANCILLARY ONLY
Bacterial vaginitis: NEGATIVE
CANDIDA VAGINITIS: NEGATIVE
CHLAMYDIA, DNA PROBE: NEGATIVE
Neisseria Gonorrhea: NEGATIVE
TRICH (WINDOWPATH): NEGATIVE

## 2017-03-06 ENCOUNTER — Telehealth: Payer: Self-pay | Admitting: *Deleted

## 2017-03-06 NOTE — Telephone Encounter (Signed)
Lm on voicemail of neg labs done last week.

## 2017-03-06 NOTE — Telephone Encounter (Signed)
-----   Message from Guss Bunde, MD sent at 03/04/2017  8:37 PM EDT ----- STD screening was negative.  RN to call.

## 2017-10-07 ENCOUNTER — Ambulatory Visit (HOSPITAL_COMMUNITY)
Admission: EM | Admit: 2017-10-07 | Discharge: 2017-10-07 | Disposition: A | Discharge status: Home / Self Care | Payer: BLUE CROSS/BLUE SHIELD | Attending: Physician Assistant | Admitting: Physician Assistant

## 2017-10-07 ENCOUNTER — Encounter (HOSPITAL_COMMUNITY): Payer: Self-pay | Admitting: Emergency Medicine

## 2017-10-07 DIAGNOSIS — S81811A Laceration without foreign body, right lower leg, initial encounter: Secondary | ICD-10-CM

## 2017-10-07 DIAGNOSIS — W540XXA Bitten by dog, initial encounter: Secondary | ICD-10-CM

## 2017-10-07 DIAGNOSIS — Z23 Encounter for immunization: Secondary | ICD-10-CM

## 2017-10-07 MED ORDER — TETANUS-DIPHTH-ACELL PERTUSSIS 5-2.5-18.5 LF-MCG/0.5 IM SUSP
INTRAMUSCULAR | Status: AC
  Filled 2017-10-07: qty 0.5

## 2017-10-07 MED ORDER — HYDROCODONE-ACETAMINOPHEN 5-325 MG PO TABS: 1.0000 | ORAL_TABLET | Freq: Four times a day (QID) | ORAL | 0 refills | Status: DC | PRN

## 2017-10-07 MED ORDER — TETANUS-DIPHTH-ACELL PERTUSSIS 5-2.5-18.5 LF-MCG/0.5 IM SUSP
0.5000 mL | Freq: Once | INTRAMUSCULAR | Status: AC
  Administered 2017-10-07: 0.5 mL via INTRAMUSCULAR

## 2017-10-07 MED ORDER — IBUPROFEN 800 MG PO TABS: 800.0000 mg | ORAL_TABLET | Freq: Three times a day (TID) | ORAL | 0 refills | Status: DC

## 2017-10-07 MED ORDER — LIDOCAINE-EPINEPHRINE (PF) 2 %-1:200000 IJ SOLN
INTRAMUSCULAR | Status: AC
  Filled 2017-10-07: qty 20

## 2017-10-07 MED ORDER — AMOXICILLIN-POT CLAVULANATE 875-125 MG PO TABS: 1.0000 | ORAL_TABLET | Freq: Two times a day (BID) | ORAL | 0 refills | Status: DC

## 2017-10-07 NOTE — ED Triage Notes (Signed)
Pt here with dog bite to right calf area; bleeding controlled and animal up to date on rabies vaccine

## 2017-10-07 NOTE — Discharge Instructions (Addendum)
Wash the wound twice daily with warm soap and water.  If any point you begin having fever chills worsening leg pain or worsening leg redness while taking antibiotics and please go to the emergency room.  I would like to see you back in my clinic at [address] [place], [place] in about 10 days.  The number there is [phone].  Please make an appointment.  Please come back there any time between now and then if you are having problems with your leg.

## 2017-10-07 NOTE — ED Provider Notes (Signed)
10/07/2017 10:48 PM   DOB: May 24, 1991 / MRN: [id number]  SUBJECTIVE:  Ashley Watson is a 27 y.o. female presenting for dog bite on the right leg.  This occurred about 3 hours ago.  Tells me her "husky" bit her while she was trying to break up a fight between her multiple animals.  She has had this dog for some time now and he is up-to-date on vaccinations.  She is allergic to latex.   She  has a past medical history of Migraines and Seizures ([place]).    She  reports that  has never smoked. she has never used smokeless tobacco. She reports that she drinks about 3.6 oz of alcohol per week. She reports that she uses drugs. Drug: Marijuana. Frequency: 2.00 times per week. She  reports that she currently engages in sexual activity and has had partners who are Female. She reports using the following method of birth control/protection: Condom. The patient  has no past surgical history on file.  Her family history includes Diabetes in her father; Hypertension in her father.  Review of Systems  Constitutional: Negative for fever.  Respiratory: Negative for cough.   Skin: Negative for rash.  Neurological: Negative for dizziness.     OBJECTIVE:  BP 126/86 (BP Location: Right Arm)   Pulse (!) 103   Temp 98.6 F (37 C) (Oral)   Resp 18   SpO2 99%   Physical Exam  Constitutional: She is active.  Non-toxic appearance.  Cardiovascular: Normal rate.  Pulmonary/Chest: Effort normal. No tachypnea.  Musculoskeletal:       Legs: Multiple lacerations about the right leg.  All of the documented were poorly approximated wounds.  The posterior wound was the most severe.  None of the wounds appear to involve any tendinous structures.  Neurological: She is alert.  Skin: Skin is warm and dry. She is not diaphoretic. No pallor.    No results found for this or any previous visit (from the past 72 hour(s)).  No results found.  ASSESSMENT AND PLAN:  No orders of the defined types were placed in this  encounter.    Laceration of right lower extremity, initial encounter - Mutiple.  I have washed and repaired most of her larger wounds.  She tolerated the repair well.  I used multiple throws and varying suture styles and I think is best that I see her back in my clinic at [place] for suture removal.  Of advised that she come there if she is having any problems with the wounds as well.  I am placing her on Augmentin for 7 days.  I have given her 800 mg ibuprofen and a short supply of Norco.  She is sexually active however is using condoms.  She is not missed her menstrual cycle.  Dog bite, initial encounter      The patient is advised to call or return to clinic if she does not see an improvement in symptoms, or to seek the care of the closest emergency department if she worsens with the above plan.   Philis Fendt, MHS, PA-C 10/07/2017 10:48 PM    Tereasa Coop, PA-C 10/07/17 2249

## 2017-10-08 ENCOUNTER — Telehealth (HOSPITAL_COMMUNITY): Payer: Self-pay | Admitting: Emergency Medicine

## 2017-10-08 MED ORDER — AMOXICILLIN-POT CLAVULANATE 875-125 MG PO TABS: 1.0000 | ORAL_TABLET | Freq: Two times a day (BID) | ORAL | 0 refills | Status: DC

## 2017-10-08 NOTE — Telephone Encounter (Signed)
Pt called and stated that antibiotics rx was not at the pharmacy; resent to [facility] on [address]

## 2017-10-17 ENCOUNTER — Ambulatory Visit: Payer: Self-pay | Admitting: Physician Assistant

## 2017-10-17 ENCOUNTER — Encounter: Payer: Self-pay | Admitting: Physician Assistant

## 2017-10-17 VITALS — HR 87 | Temp 99.2°F | Resp 18 | Ht 65.0 in | Wt 124.2 lb

## 2017-10-17 DIAGNOSIS — Z3200 Encounter for pregnancy test, result unknown: Secondary | ICD-10-CM

## 2017-10-17 DIAGNOSIS — Z4802 Encounter for removal of sutures: Secondary | ICD-10-CM

## 2017-10-17 LAB — POCT URINE PREGNANCY: Preg Test, Ur: NEGATIVE

## 2017-10-17 MED ORDER — MUPIROCIN 2 % EX OINT: 1.0000 "application " | TOPICAL_OINTMENT | Freq: Two times a day (BID) | CUTANEOUS | 0 refills | Status: DC

## 2017-10-17 NOTE — Patient Instructions (Addendum)
  Apply a small amount of ointment to each wound and keep covered with a Band-Aid over the next 2-3 weeks.  Come back and see me if you have any problems with the wounds.   IF you received an x-ray today, you will receive an invoice from [facility]. Please contact [facility] Radiology at [phone] with questions or concerns regarding your invoice.   IF you received labwork today, you will receive an invoice from [facility]. Please contact [facility] at [phone] with questions or concerns regarding your invoice.   Our billing staff will not be able to assist you with questions regarding bills from these companies.  You will be contacted with the lab results as soon as they are available. The fastest way to get your results is to activate your My Chart account. Instructions are located on the last page of this paperwork. If you have not heard from [place] regarding the results in 2 weeks, please contact this office.

## 2017-10-17 NOTE — Progress Notes (Signed)
    10/18/2017 2:08 PM   DOB: 02-Nov-1990 / MRN: [id number]  SUBJECTIVE:  Ashley Watson is a 27 y.o. female presenting for suture removal.  I had seen her at the urgent care about 8 days ago and said of several wounds that were secondary to a dog bite.  That encounter is well documented.  She tells me she feels well today and denies any complaints.  She is allergic to latex.   She  has a past medical history of Migraines and Seizures ([place]).    She  reports that she has never smoked. She has never used smokeless tobacco. She reports that she drinks about 3.6 oz of alcohol per week. She reports that she has current or past drug history. Drug: Marijuana. Frequency: 2.00 times per week. She  reports that she currently engages in sexual activity and has had partners who are Female. She reports using the following method of birth control/protection: Condom. The patient  has no past surgical history on file.  Her family history includes Diabetes in her father; Hypertension in her father.  Review of Systems  Constitutional: Negative for chills, diaphoresis and fever.  Eyes: Negative.   Respiratory: Negative for cough, hemoptysis, sputum production, shortness of breath and wheezing.   Cardiovascular: Negative for chest pain, orthopnea and leg swelling.  Gastrointestinal: Negative for nausea.  Skin: Negative for rash.  Neurological: Negative for dizziness, sensory change, speech change, focal weakness and headaches.    The problem list and medications were reviewed and updated by myself where necessary and exist elsewhere in the encounter.   OBJECTIVE:  Pulse 87   Temp 99.2 F (37.3 C) (Oral)   Resp 18   Ht 5\' 5"  (1.651 m)   Wt 124 lb 3.2 oz (56.3 kg)   LMP 10/02/2017   SpO2 97%   BMI 20.67 kg/m   Physical Exam  Constitutional: She is active.  Non-toxic appearance.  Cardiovascular: Normal rate.  Pulmonary/Chest: Effort normal. No tachypnea.  Neurological: She is alert.  Skin: Skin  is warm and dry. She is not diaphoretic. No pallor.  Multiple sutures in place about the right leg.  All have been removed without difficulty.    Results for orders placed or performed in visit on 10/17/17 (from the past 72 hour(s))  POCT urine pregnancy     Status: None   Collection Time: 10/17/17 11:23 AM  Result Value Ref Range   Preg Test, Ur Negative Negative    No results found.  ASSESSMENT AND PLAN:  Ashley Watson was seen today for suture / staple removal and possible pregnancy.  Diagnoses and all orders for this visit:  Encounter for removal of sutures: Patient did complete Augmentin 7 days of therapy starting the day she sustained a wounds from the dog.  This was a [employer], broadcasting/film/video.  She feels well today denies complaints. -     mupirocin ointment (BACTROBAN) 2 %; Apply 1 application topically 2 (two) times daily.  Possible pregnancy -     POCT urine pregnancy    The patient is advised to call or return to clinic if she does not see an improvement in symptoms, or to seek the care of the closest emergency department if she worsens with the above plan.   Philis Fendt, MHS, PA-C [facility] 10/18/2017 2:08 PM

## 2018-05-23 ENCOUNTER — Emergency Department
Admission: EM | Admit: 2018-05-23 | Discharge: 2018-05-23 | Disposition: A | Discharge status: Home / Self Care | Payer: 59 | Source: Home / Self Care | Attending: Family Medicine | Admitting: Family Medicine

## 2018-05-23 ENCOUNTER — Other Ambulatory Visit: Payer: Self-pay

## 2018-05-23 ENCOUNTER — Encounter: Payer: Self-pay | Admitting: Emergency Medicine

## 2018-05-23 DIAGNOSIS — R109 Unspecified abdominal pain: Secondary | ICD-10-CM | POA: Diagnosis not present

## 2018-05-23 LAB — POCT URINALYSIS DIP (MANUAL ENTRY)
Bilirubin, UA: NEGATIVE
Blood, UA: NEGATIVE
Glucose, UA: NEGATIVE mg/dL
Ketones, POC UA: NEGATIVE mg/dL
Nitrite, UA: NEGATIVE
Protein Ur, POC: NEGATIVE mg/dL
Spec Grav, UA: 1.02 (ref 1.010–1.025)
Urobilinogen, UA: 0.2 E.U./dL
pH, UA: 7.5 (ref 5.0–8.0)

## 2018-05-23 LAB — POCT URINE PREGNANCY: Preg Test, Ur: NEGATIVE

## 2018-05-23 MED ORDER — CYCLOBENZAPRINE HCL 5 MG PO TABS: 5.0000 mg | ORAL_TABLET | Freq: Two times a day (BID) | ORAL | 0 refills | Status: DC | PRN

## 2018-05-23 NOTE — ED Provider Notes (Signed)
[facility]    CSN: [id number] Arrival date & time: 05/23/18  1052     History   Chief Complaint Chief Complaint  Patient presents with  . Flank Pain    HPI Ashley Watson is a 27 y.o. female.   HPI Ashley Watson is a 27 y.o. female presenting to UC with c/o Left side flank pain that started this morning. Pain is intermittent, sharp in nature, aching at times, radiating up and down her Left side. No hx of kidney stones in the past. She worked out at [employer] about 5 days ago but does not feel like her muscles are still sore from that. No known injury. Hx of UTIs but denies dysuria or hematuria. Denies fever, chills, n/v/d.    Past Medical History:  Diagnosis Date  . Migraines   . Seizures [facility])     Patient Active Problem List   Diagnosis Date Noted  . 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 Family planning 08/17/2014  . Dysuria 08/17/2014  . Black-out (not amnesia) 12/30/2013  . Weight loss 04/11/2013  . Low back pain 11/25/2012  . Irritable bowel syndrome 10/15/2012  . Preventive measure 04/19/2012  . Migraine without aura 04/11/2007    History reviewed. No pertinent surgical history.  OB History    Gravida  0   Para  0   Term  0   Preterm  0   AB  0   Living  0     SAB  0   TAB  0   Ectopic  0   Multiple  0   Live Births               Home Medications    Prior to Admission medications   Medication Sig Start Date End Date Taking? Authorizing Provider  cyclobenzaprine (FLEXERIL) 5 MG tablet Take 1-2 tablets (5-10 mg total) by mouth 2 (two) times daily as needed for muscle spasms. 05/23/18   Noe Gens, PA-C  ibuprofen (ADVIL,MOTRIN) 800 MG tablet Take 1 tablet (800 mg total) by mouth 3 (three) times daily. 10/07/17   Tereasa Coop, PA-C    Family History Family History  Problem Relation Age of Onset  . Diabetes Father   . Hypertension Father     Social History Social History   Tobacco Use  . Smoking status:  Never Smoker  . Smokeless tobacco: Never Used  Substance Use Topics  . Alcohol use: Yes    Alcohol/week: 6.0 standard drinks    Types: 6 Cans of beer per week    Comment: 2  . Drug use: Yes    Frequency: 2.0 times per week    Types: Marijuana     Allergies   Latex   Review of Systems Review of Systems  Constitutional: Negative for chills and fever.  Gastrointestinal: Positive for abdominal pain ( Left lower and Left upper). Negative for diarrhea, nausea and vomiting.  Genitourinary: Positive for flank pain ( Left). Negative for dysuria, frequency, hematuria, pelvic pain, urgency, vaginal bleeding, vaginal discharge and vaginal pain.  Musculoskeletal: Positive for back pain. Negative for neck pain.  Skin: Negative for color change and wound.     Physical Exam Triage Vital Signs ED Triage Vitals  Enc Vitals Group     BP 05/23/18 1128 124/82     Pulse Rate 05/23/18 1128 (!) 109     Resp --      Temp 05/23/18 1128 97.9 F (36.6 C)  Temp Source 05/23/18 1128 Oral     SpO2 05/23/18 1128 96 %     Weight 05/23/18 1130 135 lb (61.2 kg)     Height 05/23/18 1130 5\' 5"  (1.651 m)     Head Circumference --      Peak Flow --      Pain Score 05/23/18 1129 7     Pain Loc --      Pain Edu? --      Excl. in [place]? --    No data found.  Updated Vital Signs BP 124/82 (BP Location: Right Arm)   Pulse (!) 109   Temp 97.9 F (36.6 C) (Oral)   Ht 5\' 5"  (1.651 m)   Wt 135 lb (61.2 kg)   LMP 05/08/2018 (Approximate)   SpO2 96%   BMI 22.47 kg/m   Visual Acuity Right Eye Distance:   Left Eye Distance:   Bilateral Distance:    Right Eye Near:   Left Eye Near:    Bilateral Near:     Physical Exam  Constitutional: She is oriented to person, place, and time. She appears well-developed and well-nourished. No distress.  HENT:  Head: Normocephalic and atraumatic.  Mouth/Throat: Oropharynx is clear and moist.  Eyes: EOM are normal.  Neck: Normal range of motion.    Cardiovascular: Normal rate and regular rhythm.  Pulmonary/Chest: Effort normal and breath sounds normal. No respiratory distress.  Abdominal: Soft. She exhibits no distension. There is tenderness in the left upper quadrant and left lower quadrant. There is no CVA tenderness.    Musculoskeletal: Normal range of motion. She exhibits tenderness. She exhibits no edema.       Thoracic back: She exhibits tenderness.       Back:  Neurological: She is alert and oriented to person, place, and time.  Skin: Skin is warm and dry. She is not diaphoretic.  Psychiatric: She has a normal mood and affect. Her behavior is normal.  Nursing note and vitals reviewed.    UC Treatments / Results  Labs (all labs ordered are listed, but only abnormal results are displayed) Labs Reviewed  POCT URINALYSIS DIP (MANUAL ENTRY) - Abnormal; Notable for the following components:      Result Value   Clarity, UA cloudy (*)    Leukocytes, UA Small (1+) (*)    All other components within normal limits  URINE CULTURE  POCT URINE PREGNANCY    EKG None  Radiology No results found.  Procedures Procedures (including critical care time)  Medications Ordered in UC Medications - No data to display  Initial Impression / Assessment and Plan / UC Course  I have reviewed the triage vital signs and the nursing notes.  Pertinent labs & imaging results that were available during my care of the patient were reviewed by me and considered in my medical decision making (see chart for details).     Left flank pain with mild Left sided tenderness. UA: no obvious UTI Culture pending. Will tx as muscle strain at this time. Possible small renal stone. Encouraged good hydration.  Final Clinical Impressions(s) / UC Diagnoses   Final diagnoses:  Flank pain     Discharge Instructions      Flexeril (cyclobenzaprine) is a muscle relaxer and may cause drowsiness. Do not drink alcohol, drive, or operate heavy  machinery while taking.  You will be notified of the urine culture results in 2-3 days.  Please try to stay well hydrated. Follow up with family medicine next week  if not improving.      ED Prescriptions    Medication Sig Dispense Auth. Provider   cyclobenzaprine (FLEXERIL) 5 MG tablet Take 1-2 tablets (5-10 mg total) by mouth 2 (two) times daily as needed for muscle spasms. 30 tablet Noe Gens, PA-C     Controlled Substance Prescriptions [place] Controlled Substance Registry consulted? Not Applicable   Noe Gens, PA-C 05/23/18 1443

## 2018-05-23 NOTE — Discharge Instructions (Addendum)
°  Flexeril (cyclobenzaprine) is a muscle relaxer and may cause drowsiness. Do not drink alcohol, drive, or operate heavy machinery while taking.  You will be notified of the urine culture results in 2-3 days.  Please try to stay well hydrated. Follow up with family medicine next week if not improving.

## 2018-05-23 NOTE — ED Triage Notes (Signed)
Left flank pain started this am.

## 2018-05-25 ENCOUNTER — Telehealth: Payer: Self-pay

## 2018-05-25 LAB — URINE CULTURE
MICRO NUMBER:: 91281302
SPECIMEN QUALITY:: ADEQUATE

## 2018-05-25 NOTE — Telephone Encounter (Signed)
Left message for a return call if not better.

## 2018-08-08 ENCOUNTER — Ambulatory Visit (INDEPENDENT_AMBULATORY_CARE_PROVIDER_SITE_OTHER): Payer: 59

## 2018-08-08 ENCOUNTER — Ambulatory Visit (INDEPENDENT_AMBULATORY_CARE_PROVIDER_SITE_OTHER): Payer: 59 | Admitting: Sports Medicine

## 2018-08-08 ENCOUNTER — Encounter: Payer: Self-pay | Admitting: Sports Medicine

## 2018-08-08 VITALS — BP 149/65 | HR 88 | Ht 65.0 in | Wt 129.0 lb

## 2018-08-08 DIAGNOSIS — G8929 Other chronic pain: Secondary | ICD-10-CM

## 2018-08-08 DIAGNOSIS — Z3009 Encounter for other general counseling and advice on contraception: Secondary | ICD-10-CM

## 2018-08-08 DIAGNOSIS — M545 Low back pain, unspecified: Secondary | ICD-10-CM

## 2018-08-08 DIAGNOSIS — N898 Other specified noninflammatory disorders of vagina: Secondary | ICD-10-CM | POA: Diagnosis not present

## 2018-08-08 DIAGNOSIS — Z0000 Encounter for general adult medical examination without abnormal findings: Secondary | ICD-10-CM | POA: Diagnosis not present

## 2018-08-08 DIAGNOSIS — R82998 Other abnormal findings in urine: Secondary | ICD-10-CM | POA: Diagnosis not present

## 2018-08-08 LAB — POCT URINALYSIS DIPSTICK
Bilirubin, UA: NEGATIVE
Blood, UA: NEGATIVE
Glucose, UA: NEGATIVE
Ketones, UA: NEGATIVE
Nitrite, UA: NEGATIVE
Protein, UA: NEGATIVE
Spec Grav, UA: 1.01 (ref 1.010–1.025)
Urobilinogen, UA: 0.2 U/dL
pH, UA: 6 (ref 5.0–8.0)

## 2018-08-08 NOTE — Progress Notes (Signed)
Subjective:    CC: Annual physical exam  HPI:  This is a pleasant 28 year old female, she is here for her physical.  She is doing okay, she has a bit of back pain, right-sided, no radiation down the leg, moderate, persistent.  No bowel or bladder dysfunction, saddle numbness, constitutional symptoms.  Vaginal discharge: Mild, pinkish.  Not really related to her cycles, no odor, no itching.  Family planning: She is married and desires to start trying to have a baby with her husband.  She does understand that she needs to decrease her marijuana use.  She is currently using it for anxiolysis.  Agreeable to discuss this with a counselor.  She plans to make her own appointment.  I reviewed the past medical history, family history, social history, surgical history, and allergies today and no changes were needed.  Please see the problem list section below in epic for further details.  Past Medical History: Past Medical History:  Diagnosis Date  . Migraines   . Seizures ([place])    Past Surgical History: No past surgical history on file. Social History: Social History   Socioeconomic History  . Marital status: Married    Spouse name: Not on file  . Number of children: Not on file  . Years of education: Not on file  . Highest education level: Not on file  Occupational History  . Occupation: Therapist, art  Social Needs  . Financial resource strain: Not on file  . Food insecurity:    Worry: Not on file    Inability: Not on file  . Transportation needs:    Medical: Not on file    Non-medical: Not on file  Tobacco Use  . Smoking status: Never Smoker  . Smokeless tobacco: Never Used  Substance and Sexual Activity  . Alcohol use: Yes    Alcohol/week: 6.0 standard drinks    Types: 6 Cans of beer per week    Comment: 2  . Drug use: Yes    Frequency: 2.0 times per week    Types: Marijuana  . Sexual activity: Yes    Partners: Male    Birth control/protection: Condom, None    Lifestyle  . Physical activity:    Days per week: Not on file    Minutes per session: Not on file  . Stress: Not on file  Relationships  . Social connections:    Talks on phone: Not on file    Gets together: Not on file    Attends religious service: Not on file    Active member of club or organization: Not on file    Attends meetings of clubs or organizations: Not on file    Relationship status: Not on file  Other Topics Concern  . Not on file  Social History Narrative  . Not on file   Family History: Family History  Problem Relation Age of Onset  . Diabetes Father   . Hypertension Father    Allergies: Allergies  Allergen Reactions  . Latex    Medications: See med rec.  Review of Systems: No headache, visual changes, nausea, vomiting, diarrhea, constipation, dizziness, abdominal pain, skin rash, fevers, chills, night sweats, swollen lymph nodes, weight loss, chest pain, body aches, joint swelling, muscle aches, shortness of breath, mood changes, visual or auditory hallucinations.  Objective:    General: Well Developed, well nourished, and in no acute distress.  Neuro: Alert and oriented x3, extra-ocular muscles intact, sensation grossly intact. Cranial nerves II through XII are intact, motor,  sensory, and coordinative functions are all intact. HEENT: Normocephalic, atraumatic, pupils equal round reactive to light, neck supple, no masses, no lymphadenopathy, thyroid nonpalpable. Oropharynx, nasopharynx, external ear canals are unremarkable. Skin: Warm and dry, no rashes noted.  There are a few benign-appearing nevi over her back. Cardiac: Regular rate and rhythm, no murmurs rubs or gallops.  Respiratory: Clear to auscultation bilaterally. Not using accessory muscles, speaking in full sentences.  Abdominal: Soft, nontender, nondistended, positive bowel sounds, no masses, no organomegaly.  Musculoskeletal: Shoulder, elbow, wrist, hip, knee, ankle stable, and with full range  of motion.  Impression and Recommendations:    The patient was counselled, risk factors were discussed, anticipatory guidance given.  Annual physical exam Routine physical as above. Checking routine labs.   Family planning Married, planning to have a child. We discussed timing, she does need to stop her use of marijuana before she really starts trying. Checking some routine labs.  Low back pain Rehab exercises given, letter for convertible workstation. X-rays. Trace leukocytes in urinalysis, adding a urine culture before doing antibiotics. Return to see me in 4 weeks.  Vaginal discharge Pinkish per patient report, not related to cycle. Likely physiologic. We are going to do a self swab wet prep today. Trace leukocytes in urinalysis, adding a urine culture before doing antibiotics. ___________________________________________ Gwen Her. Dianah Field, M.D., ABFM., CAQSM. Primary Care and Sports Medicine [facility]  Adjunct Professor of [employer]

## 2018-08-08 NOTE — Assessment & Plan Note (Addendum)
Rehab exercises given, letter for convertible workstation. X-rays. Trace leukocytes in urinalysis, adding a urine culture before doing antibiotics. Return to see me in 4 weeks.

## 2018-08-08 NOTE — Assessment & Plan Note (Signed)
Routine physical as above. Checking routine labs.

## 2018-08-08 NOTE — Assessment & Plan Note (Signed)
Married, planning to have a child. We discussed timing, she does need to stop her use of marijuana before she really starts trying. Checking some routine labs.

## 2018-08-08 NOTE — Assessment & Plan Note (Addendum)
Pinkish per patient report, not related to cycle. Likely physiologic. We are going to do a self swab wet prep today. Trace leukocytes in urinalysis, adding a urine culture before doing antibiotics.

## 2018-08-08 NOTE — Addendum Note (Signed)
Addended by: Beatris Ship L on: 08/08/2018 11:15 AM   Modules accepted: Orders

## 2018-08-09 LAB — URINE CULTURE
MICRO NUMBER:: 33929
SPECIMEN QUALITY:: ADEQUATE

## 2018-08-12 ENCOUNTER — Other Ambulatory Visit: Payer: Self-pay | Admitting: Sports Medicine

## 2018-08-12 ENCOUNTER — Telehealth: Payer: Self-pay

## 2018-08-12 DIAGNOSIS — Z0000 Encounter for general adult medical examination without abnormal findings: Secondary | ICD-10-CM

## 2018-08-12 LAB — CBC

## 2018-08-12 LAB — WET PREP FOR TRICH, YEAST, CLUE
MICRO NUMBER:: 33380
Specimen Quality: ADEQUATE

## 2018-08-12 LAB — LIPID PANEL W/REFLEX DIRECT LDL

## 2018-08-12 LAB — TSH

## 2018-08-12 LAB — COMPREHENSIVE METABOLIC PANEL

## 2018-08-12 NOTE — Addendum Note (Signed)
Addended by: Dessie Coma on: 08/12/2018 04:13 PM   Modules accepted: Orders

## 2018-08-12 NOTE — Progress Notes (Addendum)
Opened in error.  Per Dr. Darene Lamer re-ordered labs for the patient and have left her a message to get them all completed.

## 2018-08-12 NOTE — Telephone Encounter (Signed)
[facility], down stairs, called and states patient never came in for labs and that is why the labs were never drawn.

## 2018-09-16 ENCOUNTER — Ambulatory Visit: Payer: 59 | Admitting: Sports Medicine

## 2018-09-19 ENCOUNTER — Ambulatory Visit: Payer: 59 | Admitting: Sports Medicine

## 2018-10-16 ENCOUNTER — Telehealth: Payer: Self-pay

## 2018-10-16 NOTE — Telephone Encounter (Signed)
Attempted to call pt to cancel appt on 10/21/18 due to COVID-19. Pt did not answer and voicemail box was full.

## 2018-10-18 ENCOUNTER — Telehealth: Payer: Self-pay

## 2018-10-18 NOTE — Telephone Encounter (Signed)
Attempted to call pt to cancel appt on 10/21/18 due to COVID-19. Pt did not answer and voicemail box was full.

## 2018-10-21 ENCOUNTER — Ambulatory Visit: Payer: 59 | Admitting: Obstetrics & Gynecology

## 2019-01-27 ENCOUNTER — Ambulatory Visit: Payer: 59 | Admitting: Obstetrics & Gynecology

## 2019-01-28 ENCOUNTER — Telehealth: Payer: Self-pay | Admitting: *Deleted

## 2019-01-28 NOTE — Telephone Encounter (Signed)
Could not leave patient a message to call the office to answer screening questions. Patient does not have MyChart set up.

## 2019-01-30 ENCOUNTER — Other Ambulatory Visit: Payer: Self-pay

## 2019-01-30 ENCOUNTER — Ambulatory Visit (INDEPENDENT_AMBULATORY_CARE_PROVIDER_SITE_OTHER): Payer: 59 | Admitting: Family Medicine

## 2019-01-30 ENCOUNTER — Encounter: Payer: Self-pay | Admitting: Family Medicine

## 2019-01-30 VITALS — BP 101/66 | HR 82 | Resp 16 | Ht 65.0 in | Wt 133.0 lb

## 2019-01-30 DIAGNOSIS — Z3169 Encounter for other general counseling and advice on procreation: Secondary | ICD-10-CM | POA: Insufficient documentation

## 2019-01-30 DIAGNOSIS — Z124 Encounter for screening for malignant neoplasm of cervix: Secondary | ICD-10-CM

## 2019-01-30 DIAGNOSIS — Z01411 Encounter for gynecological examination (general) (routine) with abnormal findings: Secondary | ICD-10-CM | POA: Diagnosis not present

## 2019-01-30 DIAGNOSIS — N898 Other specified noninflammatory disorders of vagina: Secondary | ICD-10-CM

## 2019-01-30 NOTE — Assessment & Plan Note (Signed)
Discussed 3 main causes, tubal, anovulation or female. She is very likely ovulating given monthly cycles, and she denies tubal factors. Has quite normal exam, no tenderness or nodularity. Discussed HSG, semen analysis, day 3 FSH. Possible referral to REI, urology and possibility of endometriosis.

## 2019-01-30 NOTE — Progress Notes (Signed)
  Subjective:     Ashley Watson is a 28 y.o. female and is here for a comprehensive physical exam. The patient reports problems - no pregnancy. Married March 2019 and no baby yet. Has intercourse 2x/week. Cycles last 4-5 days and q 28-30 days. Has painful ovulation 1st day of cycle but not a lot of pain. Has no h/o PID, or STDs. No prior surgeries. Reports vaginal odor, without discharge, irritation or itching.  The following portions of the patient's history were reviewed and updated as appropriate: allergies, current medications, past family history, past medical history, past social history, past surgical history and problem list.  Review of Systems Pertinent items noted in HPI and remainder of comprehensive ROS otherwise negative.   Objective:    BP 101/66   Pulse 82   Resp 16   Ht 5\' 5"  (1.651 m)   Wt 133 lb (60.3 kg)   LMP 01/10/2019   BMI 22.13 kg/m  General appearance: alert, cooperative and appears stated age Head: Normocephalic, without obvious abnormality, atraumatic Neck: no adenopathy, supple, symmetrical, trachea midline and thyroid not enlarged, symmetric, no tenderness/mass/nodules Lungs: clear to auscultation bilaterally Breasts: normal appearance, no masses or tenderness Heart: regular rate and rhythm, S1, S2 normal, no murmur, click, rub or gallop Abdomen: soft, non-tender; bowel sounds normal; no masses,  no organomegaly Pelvic: cervix normal in appearance, external genitalia normal, no adnexal masses or tenderness, no cervical motion tenderness, rectovaginal septum normal and uterus normal size, shape, and consistency Extremities: extremities normal, atraumatic, no cyanosis or edema Pulses: 2+ and symmetric Skin: Skin color, texture, turgor normal. No rashes or lesions Lymph nodes: Cervical, supraclavicular, and axillary nodes normal. Neurologic: Grossly normal    Assessment:    Healthy female exam.      Plan:   Problem List Items Addressed This Visit       Unprioritized   Infertility counseling - Primary    Discussed 3 main causes, tubal, anovulation or female. She is very likely ovulating given monthly cycles, and she denies tubal factors. Has quite normal exam, no tenderness or nodularity. Discussed HSG, semen analysis, day 3 FSH. Possible referral to REI, urology and possibility of endometriosis.       Relevant Orders   DG Hysterogram (HSG)   Semen Analysis, Basic   Follicle stimulating hormone    Other Visit Diagnoses    Screening for malignant neoplasm of cervix       Relevant Orders   Cytology - PAP( [facility])   Encounter for gynecological examination with abnormal finding       Vaginal odor       we did not collect swab--may get self swab when returns for labs.     Return in 3 months (on 05/02/2019).     See After Visit Summary for Counseling Recommendations

## 2019-01-30 NOTE — Patient Instructions (Signed)
 Preventive Care 21-28 Years Old, Female Preventive care refers to visits with your health care provider and lifestyle choices that can promote health and wellness. This includes:  A yearly physical exam. This may also be called an annual well check.  Regular dental visits and eye exams.  Immunizations.  Screening for certain conditions.  Healthy lifestyle choices, such as eating a healthy diet, getting regular exercise, not using drugs or products that contain nicotine and tobacco, and limiting alcohol use. What can I expect for my preventive care visit? Physical exam Your health care provider will check your:  Height and weight. This may be used to calculate body mass index (BMI), which tells if you are at a healthy weight.  Heart rate and blood pressure.  Skin for abnormal spots. Counseling Your health care provider may ask you questions about your:  Alcohol, tobacco, and drug use.  Emotional well-being.  Home and relationship well-being.  Sexual activity.  Eating habits.  Work and work environment.  Method of birth control.  Menstrual cycle.  Pregnancy history. What immunizations do I need?  Influenza (flu) vaccine  This is recommended every year. Tetanus, diphtheria, and pertussis (Tdap) vaccine  You may need a Td booster every 10 years. Varicella (chickenpox) vaccine  You may need this if you have not been vaccinated. Human papillomavirus (HPV) vaccine  If recommended by your health care provider, you may need three doses over 6 months. Measles, mumps, and rubella (MMR) vaccine  You may need at least one dose of MMR. You may also need a second dose. Meningococcal conjugate (MenACWY) vaccine  One dose is recommended if you are age 19-21 years and a first-year college student living in a residence hall, or if you have one of several medical conditions. You may also need additional booster doses. Pneumococcal conjugate (PCV13) vaccine  You may need  this if you have certain conditions and were not previously vaccinated. Pneumococcal polysaccharide (PPSV23) vaccine  You may need one or two doses if you smoke cigarettes or if you have certain conditions. Hepatitis A vaccine  You may need this if you have certain conditions or if you travel or work in places where you may be exposed to hepatitis A. Hepatitis B vaccine  You may need this if you have certain conditions or if you travel or work in places where you may be exposed to hepatitis B. Haemophilus influenzae type b (Hib) vaccine  You may need this if you have certain conditions. You may receive vaccines as individual doses or as more than one vaccine together in one shot (combination vaccines). Talk with your health care provider about the risks and benefits of combination vaccines. What tests do I need?  Blood tests  Lipid and cholesterol levels. These may be checked every 5 years starting at age 20.  Hepatitis C test.  Hepatitis B test. Screening  Diabetes screening. This is done by checking your blood sugar (glucose) after you have not eaten for a while (fasting).  Sexually transmitted disease (STD) testing.  BRCA-related cancer screening. This may be done if you have a family history of breast, ovarian, tubal, or peritoneal cancers.  Pelvic exam and Pap test. This may be done every 3 years starting at age 21. Starting at age 30, this may be done every 5 years if you have a Pap test in combination with an HPV test. Talk with your health care provider about your test results, treatment options, and if necessary, the need for more   tests. Follow these instructions at home: Eating and drinking   Eat a diet that includes fresh fruits and vegetables, whole grains, lean protein, and low-fat dairy.  Take vitamin and mineral supplements as recommended by your health care provider.  Do not drink alcohol if: ? Your health care provider tells you not to drink. ? You are  pregnant, may be pregnant, or are planning to become pregnant.  If you drink alcohol: ? Limit how much you have to 0-1 drink a day. ? Be aware of how much alcohol is in your drink. In the [place] one drink equals one 12 oz bottle of beer (355 mL), one 5 oz glass of wine (148 mL), or one 1 oz glass of hard liquor (44 mL). Lifestyle  Take daily care of your teeth and gums.  Stay active. Exercise for at least 30 minutes on 5 or more days each week.  Do not use any products that contain nicotine or tobacco, such as cigarettes, e-cigarettes, and chewing tobacco. If you need help quitting, ask your health care provider.  If you are sexually active, practice safe sex. Use a condom or other form of birth control (contraception) in order to prevent pregnancy and STIs (sexually transmitted infections). If you plan to become pregnant, see your health care provider for a preconception visit. What's next?  Visit your health care provider once a year for a well check visit.  Ask your health care provider how often you should have your eyes and teeth checked.  Stay up to date on all vaccines. This information is not intended to replace advice given to you by your health care provider. Make sure you discuss any questions you have with your health care provider. Document Released: 09/12/2001 Document Revised: 03/28/2018 Document Reviewed: 03/28/2018 Elsevier Patient Education  2020 [employer].

## 2019-02-04 LAB — CYTOLOGY - PAP
Adequacy: ABSENT
Diagnosis: NEGATIVE

## 2019-05-05 ENCOUNTER — Other Ambulatory Visit: Payer: Self-pay

## 2019-05-05 ENCOUNTER — Encounter: Payer: Self-pay | Admitting: Obstetrics & Gynecology

## 2019-05-05 ENCOUNTER — Ambulatory Visit: Payer: 59 | Admitting: Obstetrics & Gynecology

## 2019-05-05 VITALS — BP 110/67 | HR 80 | Resp 16 | Ht 65.0 in | Wt 129.0 lb

## 2019-05-05 DIAGNOSIS — N7011 Chronic salpingitis: Secondary | ICD-10-CM

## 2019-05-05 DIAGNOSIS — Z3169 Encounter for other general counseling and advice on procreation: Secondary | ICD-10-CM

## 2019-05-05 NOTE — Progress Notes (Signed)
   Subjective:    Patient ID: Ashley Watson, female    DOB: [birth year], 28 y.o.   MRN: [id number]  HPI  28 yo female presents for follow up of infertility and hydrosalpinx on left.  Patient saw Dr. Charlett Lango office.  The HSG was done.  We were unable to open the films but we can see the report.  Patient has been having timed intercourse without success of pregnancy for over a year and a half.  Has been has not had his semen analysis done yet and she is encouraged to complete this portion of the evaluation.  She should also take it to Dr. Charlett Lango office so that it is ready when she has her consultation.  Review of Systems  Constitutional: Negative.   Respiratory: Negative.   Cardiovascular: Negative.   Gastrointestinal: Negative.   Genitourinary: Negative.        Objective:   Physical Exam Vitals signs reviewed.  Constitutional:      General: She is not in acute distress.    Appearance: She is well-developed.  HENT:     Head: Normocephalic and atraumatic.  Eyes:     Conjunctiva/sclera: Conjunctivae normal.  Cardiovascular:     Rate and Rhythm: Normal rate.  Pulmonary:     Effort: Pulmonary effort is normal.  Skin:    General: Skin is warm and dry.  Neurological:     Mental Status: She is alert and oriented to person, place, and time.    Vitals:   05/05/19 0850  BP: 110/67  Pulse: 80  Resp: 16  Weight: 129 lb (58.5 kg)  Height: 5\' 5"  (1.651 m)      Assessment & Plan:  28 year old female with left hydrosalpinx and infertility 1.  Patient needs semen analysis and follow-up with Dr. Charlett Lango office 2.  Continue folic acid 3.  Patient encouraged to quit smoking.

## 2019-05-30 ENCOUNTER — Encounter

## 2019-06-05 ENCOUNTER — Telehealth: Payer: Self-pay | Admitting: Obstetrics & Gynecology

## 2019-06-05 NOTE — Telephone Encounter (Signed)
Reviewed HSG and semen analysis.  Left fallopian tube has decreased spillage and semen analysis shows morphology abnormalities.  Pt's husband is taking vitamin regimen.  They will be getting a consultation with dr Darreld Mclean or his partners to see how aggressive to proceed and next steps.  All questions answered.

## 2019-09-29 ENCOUNTER — Ambulatory Visit: Payer: 59 | Admitting: Sports Medicine

## 2019-10-02 ENCOUNTER — Other Ambulatory Visit: Payer: Self-pay

## 2019-10-02 ENCOUNTER — Encounter: Payer: Self-pay | Admitting: Sports Medicine

## 2019-10-02 ENCOUNTER — Ambulatory Visit (INDEPENDENT_AMBULATORY_CARE_PROVIDER_SITE_OTHER): Payer: 59 | Admitting: Sports Medicine

## 2019-10-02 DIAGNOSIS — Z3169 Encounter for other general counseling and advice on procreation: Secondary | ICD-10-CM | POA: Diagnosis not present

## 2019-10-02 DIAGNOSIS — D229 Melanocytic nevi, unspecified: Secondary | ICD-10-CM

## 2019-10-02 NOTE — Assessment & Plan Note (Signed)
1 cm nevus on the left upper back, we will revisit this in 1 year, picture saved in the chart.

## 2019-10-02 NOTE — Progress Notes (Signed)
    Procedures performed today:    None.  Independent interpretation of notes and tests performed by another provider:   I personally reviewed her husband semen analysis, he has good sperm counts, adequate motility and mild teratospermia  Impression and Recommendations:      Infertility counseling Ashley Watson is here to discuss fertility, she and her husband have been trying now for 2 years. Her husband did have a semen analysis with [place] that was relatively okay, he did have teratospermia however his counts and motility were adequate. It sounds as though Ashley Watson has had a hysterogram, I do not have the results. I walked her through the processes, she will discontinue her marijuana use which could certainly affect her fertility. We will go to get some labs including CBC, CMP, TSH, day 3 FSH, LH, estrogen, progesterone. I am also in to refer her back to reproductive endocrinology, she will likely proceed with IUI.  Nevus 1 cm nevus on the left upper back, we will revisit this in 1 year, picture saved in the chart.    ___________________________________________ Gwen Her. Dianah Field, M.D., ABFM., CAQSM. Primary Care and [place] Instructor of [place] [employer] [facility] of [employer]

## 2019-10-02 NOTE — Assessment & Plan Note (Addendum)
Ashley Watson is here to discuss fertility, she and her husband have been trying now for 2 years. Her husband did have a semen analysis with [facility] that was relatively okay, he did have teratospermia however his counts and motility were adequate. It sounds as though Ashley Watson has had a hysterogram, I do not have the results. I walked her through the processes, she will discontinue her marijuana use which could certainly affect her fertility. We will go to get some labs including CBC, CMP, TSH, day 3 FSH, LH, estrogen, progesterone. I am also in to refer her back to reproductive endocrinology, she will likely proceed with IUI.

## 2019-10-13 ENCOUNTER — Other Ambulatory Visit: Payer: Self-pay | Admitting: *Deleted

## 2019-10-13 DIAGNOSIS — Z3169 Encounter for other general counseling and advice on procreation: Secondary | ICD-10-CM

## 2019-12-06 LAB — FOLLICLE STIMULATING HORMONE: FSH: 9.5 m[IU]/mL

## 2020-03-04 ENCOUNTER — Other Ambulatory Visit: Payer: Self-pay | Admitting: Obstetrics and Gynecology

## 2020-05-28 ENCOUNTER — Other Ambulatory Visit: Payer: Self-pay

## 2020-05-28 ENCOUNTER — Ambulatory Visit (INDEPENDENT_AMBULATORY_CARE_PROVIDER_SITE_OTHER): Payer: 59

## 2020-05-28 VITALS — BP 112/77 | HR 91 | Ht 65.0 in | Wt 151.0 lb

## 2020-05-28 DIAGNOSIS — Z01419 Encounter for gynecological examination (general) (routine) without abnormal findings: Secondary | ICD-10-CM | POA: Diagnosis not present

## 2020-05-28 NOTE — Progress Notes (Signed)
    GYNECOLOGY ANNUAL PREVENTATIVE CARE ENCOUNTER NOTE  History:     Ashley Watson is a 29 y.o. G0P0000 female here for a routine annual gynecologic exam.  Current complaints: none.   Denies abnormal vaginal bleeding, discharge, pelvic pain, problems with intercourse or other gynecologic concerns.    Gynecologic History Patient's last menstrual period was 05/16/2020. Contraception: none, desires pregnancy Last Pap: 2020. Results were: normal with negative HPV  Obstetric History OB History  Gravida Para Term Preterm AB Living  0 0 0 0 0 0  SAB TAB Ectopic Multiple Live Births  0 0 0 0      Past Medical History:  Diagnosis Date  . Migraines     History reviewed. No pertinent surgical history.  Current Outpatient Medications on File Prior to Visit  Medication Sig Dispense Refill  . ferrous sulfate 325 (65 FE) MG EC tablet Take 325 mg by mouth 3 (three) times daily with meals.     No current facility-administered medications on file prior to visit.    Allergies  Allergen Reactions  . Latex     Social History:  reports that she has never smoked. She has never used smokeless tobacco. She reports current alcohol use of about 1.0 standard drink of alcohol per week. She reports current drug use. Frequency: 7.00 times per week. Drug: Marijuana.  Family History  Problem Relation Age of Onset  . Diabetes Father   . Hypertension Father   . Gout Father     The following portions of the patient's history were reviewed and updated as appropriate: allergies, current medications, past family history, past medical history, past social history, past surgical history and problem list.  Review of Systems Pertinent items noted in HPI and remainder of comprehensive ROS otherwise negative.  Physical Exam:  BP 112/77   Pulse 91   Ht 5\' 5"  (1.651 m)   Wt 151 lb (68.5 kg)   LMP 05/16/2020   BMI 25.13 kg/m  CONSTITUTIONAL: Well-developed, well-nourished female in no acute  distress.  HENT:  Normocephalic, atraumatic, External right and left ear normal. Oropharynx is clear and moist EYES: Conjunctivae and EOM are normal. Pupils are equal, round, and reactive to light. No scleral icterus.  NECK: Normal range of motion, supple, no masses.  Normal thyroid.  SKIN: Skin is warm and dry. No rash noted. Not diaphoretic. No erythema. No pallor. MUSCULOSKELETAL: Normal range of motion. No tenderness.  No cyanosis, clubbing, or edema.  2+ distal pulses. NEUROLOGIC: Alert and oriented to person, place, and time. Normal reflexes, muscle tone coordination.  PSYCHIATRIC: Normal mood and affect. Normal behavior. Normal judgment and thought content. CARDIOVASCULAR: Normal heart rate noted, regular rhythm RESPIRATORY: Clear to auscultation bilaterally. Effort and breath sounds normal, no problems with respiration noted. BREASTS: Symmetric in size. No masses, tenderness, skin changes, nipple drainage, or lymphadenopathy bilaterally. Performed in the presence of a chaperone. ABDOMEN: Soft, no distention noted.  No tenderness, rebound or guarding.  PELVIC: declined   Assessment and Plan:    1. Encounter for well woman exam with routine gynecological exam -Patient states she has had pelvics and testing at Dr. Corky Sing office and declines today.  -Breast exam normal -Scheduled for hydrosalpinx removal in December with Dr. Darreld Mclean -Patient to return in 1 year or sooner if needed.  Routine preventative health maintenance measures emphasized. Please refer to After Visit Summary for other counseling recommendations.   Wende Mott, CNM 05/28/20 10:54 AM

## 2020-07-01 ENCOUNTER — Encounter (HOSPITAL_BASED_OUTPATIENT_CLINIC_OR_DEPARTMENT_OTHER): Payer: Self-pay | Admitting: Obstetrics and Gynecology

## 2020-07-01 ENCOUNTER — Other Ambulatory Visit: Payer: Self-pay

## 2020-07-01 NOTE — Progress Notes (Signed)
Spoke w/ via phone for pre-op interview---pt Lab needs dos----T & S               Lab results------none COVID test ------07-05-2020 850 am Arrive at -------1030 am 07-08-2020 NPO after MN NO Solid Food.  Clear liquids from MN until---930 am then npo Medications to take morning of surgery -----none Diabetic medication -----n/a Patient Special Instructions -----follow all bowel prep instructions from dr Kerin Perna Pre-Op special Istructions -----none Patient verbalized understanding of instructions that were given at this phone interview. Patient denies shortness of breath, chest pain, fever, cough at this phone interview.

## 2020-07-05 ENCOUNTER — Other Ambulatory Visit (HOSPITAL_COMMUNITY)
Admission: RE | Admit: 2020-07-05 | Discharge: 2020-07-05 | Disposition: A | Discharge status: Home / Self Care | Payer: 59 | Source: Ambulatory Visit | Attending: Obstetrics and Gynecology | Admitting: Obstetrics and Gynecology

## 2020-07-05 DIAGNOSIS — Z20822 Contact with and (suspected) exposure to covid-19: Secondary | ICD-10-CM | POA: Diagnosis not present

## 2020-07-05 DIAGNOSIS — Z01812 Encounter for preprocedural laboratory examination: Secondary | ICD-10-CM | POA: Diagnosis present

## 2020-07-05 LAB — SARS CORONAVIRUS 2 (TAT 6-24 HRS): SARS Coronavirus 2: NEGATIVE

## 2020-07-08 ENCOUNTER — Ambulatory Visit (HOSPITAL_BASED_OUTPATIENT_CLINIC_OR_DEPARTMENT_OTHER)
Admission: RE | Admit: 2020-07-08 | Discharge: 2020-07-08 | Disposition: A | Discharge status: Home / Self Care | Payer: 59 | Attending: Obstetrics and Gynecology | Admitting: Obstetrics and Gynecology

## 2020-07-08 ENCOUNTER — Ambulatory Visit (HOSPITAL_BASED_OUTPATIENT_CLINIC_OR_DEPARTMENT_OTHER): Payer: 59 | Admitting: Anesthesiology

## 2020-07-08 ENCOUNTER — Encounter (HOSPITAL_BASED_OUTPATIENT_CLINIC_OR_DEPARTMENT_OTHER): Payer: Self-pay | Admitting: Obstetrics and Gynecology

## 2020-07-08 ENCOUNTER — Other Ambulatory Visit: Payer: Self-pay

## 2020-07-08 ENCOUNTER — Encounter (HOSPITAL_BASED_OUTPATIENT_CLINIC_OR_DEPARTMENT_OTHER)
Admission: RE | Disposition: A | Discharge status: Home / Self Care | Payer: Self-pay | Source: Home / Self Care | Attending: Obstetrics and Gynecology

## 2020-07-08 DIAGNOSIS — N803 Endometriosis of pelvic peritoneum: Secondary | ICD-10-CM | POA: Insufficient documentation

## 2020-07-08 DIAGNOSIS — N7011 Chronic salpingitis: Secondary | ICD-10-CM | POA: Diagnosis not present

## 2020-07-08 DIAGNOSIS — N802 Endometriosis of fallopian tube: Secondary | ICD-10-CM | POA: Diagnosis not present

## 2020-07-08 DIAGNOSIS — Z79899 Other long term (current) drug therapy: Secondary | ICD-10-CM | POA: Diagnosis not present

## 2020-07-08 DIAGNOSIS — N979 Female infertility, unspecified: Secondary | ICD-10-CM | POA: Diagnosis not present

## 2020-07-08 DIAGNOSIS — N736 Female pelvic peritoneal adhesions (postinfective): Secondary | ICD-10-CM | POA: Diagnosis not present

## 2020-07-08 DIAGNOSIS — N801 Endometriosis of ovary: Secondary | ICD-10-CM | POA: Diagnosis not present

## 2020-07-08 HISTORY — DX: Dysmenorrhea, unspecified: N94.6

## 2020-07-08 HISTORY — PX: LAPAROSCOPIC UNILATERAL SALPINGECTOMY: SHX5934

## 2020-07-08 LAB — POCT PREGNANCY, URINE: Preg Test, Ur: NEGATIVE

## 2020-07-08 SURGERY — SALPINGECTOMY, UNILATERAL, LAPAROSCOPIC
Anesthesia: General | Site: Uterus | Laterality: Left

## 2020-07-08 MED ORDER — ACETAMINOPHEN 500 MG PO TABS
1000.0000 mg | ORAL_TABLET | Freq: Once | ORAL | Status: AC
  Administered 2020-07-08: 1000 mg via ORAL

## 2020-07-08 MED ORDER — FENTANYL CITRATE (PF) 100 MCG/2ML IJ SOLN: 25.0000 ug | INTRAMUSCULAR | Status: DC | PRN

## 2020-07-08 MED ORDER — OXYCODONE HCL 5 MG PO TABS
ORAL_TABLET | ORAL | Status: AC
  Filled 2020-07-08: qty 1

## 2020-07-08 MED ORDER — LACTATED RINGERS IV SOLN: INTRAVENOUS | Status: DC

## 2020-07-08 MED ORDER — ROCURONIUM BROMIDE 10 MG/ML (PF) SYRINGE
PREFILLED_SYRINGE | INTRAVENOUS | Status: DC | PRN
  Administered 2020-07-08: 10 mg via INTRAVENOUS
  Administered 2020-07-08: 60 mg via INTRAVENOUS

## 2020-07-08 MED ORDER — SCOPOLAMINE 1 MG/3DAYS TD PT72
MEDICATED_PATCH | TRANSDERMAL | Status: AC
  Filled 2020-07-08: qty 1

## 2020-07-08 MED ORDER — FENTANYL CITRATE (PF) 100 MCG/2ML IJ SOLN
INTRAMUSCULAR | Status: AC
  Filled 2020-07-08: qty 2

## 2020-07-08 MED ORDER — ROCURONIUM BROMIDE 10 MG/ML (PF) SYRINGE
PREFILLED_SYRINGE | INTRAVENOUS | Status: AC
  Filled 2020-07-08: qty 10

## 2020-07-08 MED ORDER — MIDAZOLAM HCL 2 MG/2ML IJ SOLN
INTRAMUSCULAR | Status: AC
  Filled 2020-07-08: qty 2

## 2020-07-08 MED ORDER — OXYCODONE-ACETAMINOPHEN 7.5-325 MG PO TABS
1.0000 | ORAL_TABLET | ORAL | 0 refills | Status: DC | PRN
Start: 2020-07-08 — End: 2020-12-08

## 2020-07-08 MED ORDER — CEFAZOLIN SODIUM-DEXTROSE 2-4 GM/100ML-% IV SOLN
2.0000 g | INTRAVENOUS | Status: AC
  Administered 2020-07-08: 2 g via INTRAVENOUS

## 2020-07-08 MED ORDER — EPHEDRINE 5 MG/ML INJ
INTRAVENOUS | Status: AC
  Filled 2020-07-08: qty 10

## 2020-07-08 MED ORDER — ONDANSETRON HCL 4 MG PO TABS: 4.0000 mg | ORAL_TABLET | Freq: Every day | ORAL | 1 refills | Status: DC | PRN

## 2020-07-08 MED ORDER — LIDOCAINE 2% (20 MG/ML) 5 ML SYRINGE
INTRAMUSCULAR | Status: DC | PRN
  Administered 2020-07-08: 80 mg via INTRAVENOUS

## 2020-07-08 MED ORDER — DEXAMETHASONE SODIUM PHOSPHATE 10 MG/ML IJ SOLN
INTRAMUSCULAR | Status: AC
  Filled 2020-07-08: qty 1

## 2020-07-08 MED ORDER — SUGAMMADEX SODIUM 500 MG/5ML IV SOLN
INTRAVENOUS | Status: AC
  Filled 2020-07-08: qty 5

## 2020-07-08 MED ORDER — METHYLENE BLUE 0.5 % INJ SOLN
INTRAVENOUS | Status: DC | PRN
  Administered 2020-07-08: 2 mL

## 2020-07-08 MED ORDER — BUPIVACAINE-EPINEPHRINE 0.5% -1:200000 IJ SOLN
INTRAMUSCULAR | Status: DC | PRN
  Administered 2020-07-08: 10 mL

## 2020-07-08 MED ORDER — PROPOFOL 10 MG/ML IV BOLUS
INTRAVENOUS | Status: AC
  Filled 2020-07-08: qty 20

## 2020-07-08 MED ORDER — OXYCODONE HCL 5 MG PO TABS
5.0000 mg | ORAL_TABLET | Freq: Once | ORAL | Status: AC
  Administered 2020-07-08: 5 mg via ORAL

## 2020-07-08 MED ORDER — ONDANSETRON HCL 4 MG/2ML IJ SOLN
INTRAMUSCULAR | Status: DC | PRN
  Administered 2020-07-08: 4 mg via INTRAVENOUS

## 2020-07-08 MED ORDER — DIPHENHYDRAMINE HCL 50 MG/ML IJ SOLN
INTRAMUSCULAR | Status: AC
  Filled 2020-07-08: qty 1

## 2020-07-08 MED ORDER — DEXAMETHASONE SODIUM PHOSPHATE 10 MG/ML IJ SOLN
INTRAMUSCULAR | Status: DC | PRN
  Administered 2020-07-08: 10 mg via INTRAVENOUS

## 2020-07-08 MED ORDER — SCOPOLAMINE 1 MG/3DAYS TD PT72
1.0000 | MEDICATED_PATCH | TRANSDERMAL | Status: DC
  Administered 2020-07-08: 1.5 mg via TRANSDERMAL

## 2020-07-08 MED ORDER — CEFAZOLIN SODIUM-DEXTROSE 2-4 GM/100ML-% IV SOLN
INTRAVENOUS | Status: AC
  Filled 2020-07-08: qty 100

## 2020-07-08 MED ORDER — GLYCOPYRROLATE PF 0.2 MG/ML IJ SOSY
PREFILLED_SYRINGE | INTRAMUSCULAR | Status: DC | PRN
  Administered 2020-07-08: .2 mg via INTRAVENOUS

## 2020-07-08 MED ORDER — EPHEDRINE SULFATE-NACL 50-0.9 MG/10ML-% IV SOSY
PREFILLED_SYRINGE | INTRAVENOUS | Status: DC | PRN
  Administered 2020-07-08: 10 mg via INTRAVENOUS

## 2020-07-08 MED ORDER — ONDANSETRON HCL 4 MG/2ML IJ SOLN
INTRAMUSCULAR | Status: AC
  Filled 2020-07-08: qty 2

## 2020-07-08 MED ORDER — ACETAMINOPHEN 500 MG PO TABS
ORAL_TABLET | ORAL | Status: AC
  Filled 2020-07-08: qty 2

## 2020-07-08 MED ORDER — PROPOFOL 10 MG/ML IV BOLUS
INTRAVENOUS | Status: DC | PRN
  Administered 2020-07-08: 200 mg via INTRAVENOUS

## 2020-07-08 MED ORDER — KETOROLAC TROMETHAMINE 30 MG/ML IJ SOLN
INTRAMUSCULAR | Status: AC
  Filled 2020-07-08: qty 1

## 2020-07-08 MED ORDER — POVIDONE-IODINE 10 % EX SWAB: 2.0000 "application " | Freq: Once | CUTANEOUS | Status: DC

## 2020-07-08 MED ORDER — DIPHENHYDRAMINE HCL 50 MG/ML IJ SOLN
INTRAMUSCULAR | Status: DC | PRN
  Administered 2020-07-08: 25 mg via INTRAVENOUS

## 2020-07-08 MED ORDER — KETOROLAC TROMETHAMINE 30 MG/ML IJ SOLN
INTRAMUSCULAR | Status: DC | PRN
  Administered 2020-07-08: 30 mg via INTRAVENOUS

## 2020-07-08 MED ORDER — GLYCOPYRROLATE PF 0.2 MG/ML IJ SOSY
PREFILLED_SYRINGE | INTRAMUSCULAR | Status: AC
  Filled 2020-07-08: qty 1

## 2020-07-08 MED ORDER — MIDAZOLAM HCL 5 MG/5ML IJ SOLN
INTRAMUSCULAR | Status: DC | PRN
  Administered 2020-07-08: 2 mg via INTRAVENOUS

## 2020-07-08 MED ORDER — LIDOCAINE HCL (PF) 2 % IJ SOLN
INTRAMUSCULAR | Status: AC
  Filled 2020-07-08: qty 5

## 2020-07-08 MED ORDER — KETAMINE HCL 10 MG/ML IJ SOLN
INTRAMUSCULAR | Status: AC
  Filled 2020-07-08: qty 1

## 2020-07-08 MED ORDER — KETAMINE HCL 10 MG/ML IJ SOLN
INTRAMUSCULAR | Status: DC | PRN
  Administered 2020-07-08: 30 mg via INTRAVENOUS

## 2020-07-08 MED ORDER — FENTANYL CITRATE (PF) 100 MCG/2ML IJ SOLN
INTRAMUSCULAR | Status: DC | PRN
  Administered 2020-07-08: 100 ug via INTRAVENOUS
  Administered 2020-07-08 (×2): 50 ug via INTRAVENOUS

## 2020-07-08 MED ORDER — SUGAMMADEX SODIUM 200 MG/2ML IV SOLN
INTRAVENOUS | Status: DC | PRN
  Administered 2020-07-08: 100 mg via INTRAVENOUS
  Administered 2020-07-08: 200 mg via INTRAVENOUS

## 2020-07-08 SURGICAL SUPPLY — 48 items
ADH SKN CLS APL DERMABOND .7 (GAUZE/BANDAGES/DRESSINGS) ×1
BAG RETRIEVAL 10 (BASKET)
BAG RETRIEVAL 10MM (BASKET)
BRR ADH 6X5 SEPRAFILM 1 SHT (MISCELLANEOUS) ×2
CABLE HIGH FREQUENCY MONO STRZ (ELECTRODE) ×6 IMPLANT
CATH ROBINSON RED A/P 18FR (CATHETERS) IMPLANT
CNTNR URN SCR LID CUP LEK RST (MISCELLANEOUS) IMPLANT
CONT SPEC 4OZ STRL OR WHT (MISCELLANEOUS)
COVER MAYO STAND STRL (DRAPES) ×3 IMPLANT
COVER WAND RF STERILE (DRAPES) ×3 IMPLANT
DERMABOND ADVANCED (GAUZE/BANDAGES/DRESSINGS) ×2
DERMABOND ADVANCED .7 DNX12 (GAUZE/BANDAGES/DRESSINGS) ×1 IMPLANT
DRSG COVADERM PLUS 2X2 (GAUZE/BANDAGES/DRESSINGS) IMPLANT
DRSG OPSITE POSTOP 3X4 (GAUZE/BANDAGES/DRESSINGS) IMPLANT
DRSG TELFA 3X8 NADH (GAUZE/BANDAGES/DRESSINGS) ×3 IMPLANT
DURAPREP 26ML APPLICATOR (WOUND CARE) ×3 IMPLANT
ELECT REM PT RETURN 9FT ADLT (ELECTROSURGICAL) ×3
ELECTRODE REM PT RTRN 9FT ADLT (ELECTROSURGICAL) ×1 IMPLANT
GAUZE 4X4 16PLY RFD (DISPOSABLE) ×3 IMPLANT
GLOVE BIO SURGEON STRL SZ8 (GLOVE) ×6 IMPLANT
GOWN STRL REUS W/TWL LRG LVL3 (GOWN DISPOSABLE) ×3 IMPLANT
KIT TURNOVER CYSTO (KITS) ×3 IMPLANT
MANIPULATOR UTERINE 4.5 ZUMI (MISCELLANEOUS) ×3 IMPLANT
NEEDLE FILTER BLUNT 18X 1/2SAF (NEEDLE) ×2
NEEDLE FILTER BLUNT 18X1 1/2 (NEEDLE) ×1 IMPLANT
NEEDLE INSUFFLATION 120MM (ENDOMECHANICALS) ×3 IMPLANT
PACK LAPAROSCOPY BASIN (CUSTOM PROCEDURE TRAY) ×3 IMPLANT
PACK TRENDGUARD 450 HYBRID PRO (MISCELLANEOUS) ×1 IMPLANT
SEPRAFILM MEMBRANE 5X6 (MISCELLANEOUS) ×6 IMPLANT
SET SUCTION IRRIG HYDROSURG (IRRIGATION / IRRIGATOR) ×3 IMPLANT
SET TUBE SMOKE EVAC HIGH FLOW (TUBING) ×3 IMPLANT
SHEARS HARMONIC ACE PLUS 36CM (ENDOMECHANICALS) ×3 IMPLANT
STOPCOCK 4 WAY LG BORE MALE ST (IV SETS) ×3 IMPLANT
SUT MNCRL AB 4-0 PS2 18 (SUTURE) ×3 IMPLANT
SYR 20ML LL LF (SYRINGE) ×3 IMPLANT
SYR 30ML LL (SYRINGE) ×3 IMPLANT
SYR 50ML LL SCALE MARK (SYRINGE) IMPLANT
SYR 5ML LL (SYRINGE) ×3 IMPLANT
SYR CONTROL 10ML LL (SYRINGE) ×9 IMPLANT
SYR TOOMEY IRRIG 70ML (MISCELLANEOUS) ×3
SYRINGE TOOMEY IRRIG 70ML (MISCELLANEOUS) ×1 IMPLANT
SYS BAG RETRIEVAL 10MM (BASKET)
SYSTEM BAG RETRIEVAL 10MM (BASKET) IMPLANT
TOWEL OR 17X26 10 PK STRL BLUE (TOWEL DISPOSABLE) ×3 IMPLANT
TRAY FOLEY W/BAG SLVR 14FR LF (SET/KITS/TRAYS/PACK) ×3 IMPLANT
TRENDGUARD 450 HYBRID PRO PACK (MISCELLANEOUS) ×3
TROCAR OPTI TIP 5M 100M (ENDOMECHANICALS) ×9 IMPLANT
WARMER LAPAROSCOPE (MISCELLANEOUS) ×3 IMPLANT

## 2020-07-08 NOTE — Op Note (Signed)
Operative Note  Preoperative diagnosis: Left hydrosalpinx, infertility dysmenorrhea, pelvic pain, probable endometriosis  Postoperative diagnosis: Left hydrosalpinx, stage III endometriosis of the left fallopian tube, ovaries and pelvic peritoneum, infertility, dysmenorrhea, pelvic pain, pelvic adhesions  Procedure: Laparoscopy, lysis of adhesions, left salpingectomy, electrosurgical excision and vaporization of ovarian and peritoneal endometriotic lesions, chromotubation  Anesthesia: Gen. endotracheal  Complications: None  Estimated blood loss: <10 cc  Specimens: Left hydrosalpinx, posterior cul-de-sac lesions, ovarian superficial lesions, right  Findings: On exam under anesthesia, external genitalia, Bartholin's, Skene's, urethra and vagina were normal the cervix was grossly normal.  Uterus was retroverted and deviated to the right.  It sounded to 8 cm.   On laparoscopy, upper abdomen, liver surface and diaphragm surfaces were normal. Gallbladder was normal. The appendix appeared normal.  There were pericecal adhesions to the right lower quadrant abdominal wall peritoneum.  There were filmy adhesions between the sigmoid mesentery and the pelvic brim, all the way extending into the sidewall of the true pelvis.  These were lysed.   Anterior cul-de-sac peritoneum appeared normal uterus was deviated and dextrorotated. There were filmy adhesions to the posterior uterine serosa which were excised.  The left tube was normal proximally but the distal tube showed hydrosalpinx to a diameter of 1.5 cm with fimbria of tuft being right outside the hydrosalpinx.  It was not clear whether this was due to endometriosis or congenital. There were superficial endometriotic lesions on the left ovary as well as right ovary, each encompassing it surface area of 1.5 x 1.5 cm. The right tube was normal with 5 out of 5 fimbria and it showed patency with chromotubation. There were diffuse clear and stellate lesions of  endometriosis along the posterior cul-de-sac between the uterosacral ligament insertion sites as well as in the left ovarian fossa and some in the right ovarian fossa.  These areas were excised and or ablated with needle tip electrode.  Description of the procedure: The patient was placed in dorsal supine position and general endotracheal anesthesia was given. 2 g of cefazolin were given intravenously for prophylaxis. Patient was placed in lithotomy position. She was prepped and draped inside manner.  The bladder was catheterized with a Foley catheter.  The cervix was dilated to 88 [place] with [employer] dilators.  A ZUMI catheter was placed into the uterine cavity. The surgeon was regloved and a surgical field was created on the abdomen.  After preemptive anesthesia of all surgical sites with 0.25% bupivacaine with 1 200,000 epinephrine, a 5 mm intraumbilical skin incision was made and a Verress needle was inserted. Its correct location was confirmed. A pneumoperitoneum was created with carbon dioxide.  5 mm laparoscope with a 30 lens was inserted and video laparoscopy was started . A left lower quadrant 5 mm and a right lower quadrant  5 mm incisions were made and ancillary trochars were placed under direct visualization. Above findings were noted. Adhesions along the left sigmoid mesentery were lysed.  The superficial endometriosis on the ovarian surfaces bilaterally were ablated with hook electrode in cutting current at 47 W.  Using the same hook electrode the peritoneal endometriosis areas were excised and ablated using hydrodissection.  We took care to avoid vital structures such as ureters bilaterally and intestines. Since the right fallopian tube was entirely normal we made a decision to remove the left tube.  This was performed by transecting the tube at the uterotubal junction with harmonic Ace and then applying the harmonic Ace successively along the mesosalpinx very close  to the tubal muscularis.  The  extirpated tube was drained and then removed from one of the 5 mm ancillary ports and submitted to pathology.  The pelvis was copiously irrigated and aspirated and hemostasis was insured.  Instrument and lap pad count was correct x2.  As an adhesion barrier we used a 6 slurry of 2 large sheets of Seprafilm in 60 cc of normal saline and instilled it into the posterior cul-de-sac.  The 5 mm ports were approximated with 4-0 Monocryl in subcuticular stitches.  The incisions were approximated with Dermabond.   The patient tolerated the procedure well and was transferred to recovery room in satisfactory condition.   Governor Specking, MD

## 2020-07-08 NOTE — Anesthesia Preprocedure Evaluation (Signed)
Anesthesia Evaluation  Patient identified by MRN, date of birth, ID band Patient awake    Reviewed: Allergy & Precautions, NPO status , Patient's Chart, lab work & pertinent test results  Airway Mallampati: II  TM Distance: >3 FB Neck ROM: Full    Dental no notable dental hx.    Pulmonary neg pulmonary ROS,    Pulmonary exam normal breath sounds clear to auscultation       Cardiovascular negative cardio ROS Normal cardiovascular exam Rhythm:Regular Rate:Normal     Neuro/Psych  Headaches, negative psych ROS   GI/Hepatic negative GI ROS, (+)     substance abuse  marijuana use,   Endo/Other  negative endocrine ROS  Renal/GU negative Renal ROS  negative genitourinary   Musculoskeletal negative musculoskeletal ROS (+)   Abdominal   Peds  Hematology negative hematology ROS (+)   Anesthesia Other Findings   Reproductive/Obstetrics                             Anesthesia Physical Anesthesia Plan  ASA: II  Anesthesia Plan: General   Post-op Pain Management:    Induction: Intravenous  PONV Risk Score and Plan: 3 and Midazolam, Dexamethasone and Ondansetron  Airway Management Planned: Oral ETT  Additional Equipment:   Intra-op Plan:   Post-operative Plan: Extubation in OR  Informed Consent: I have reviewed the patients History and Physical, chart, labs and discussed the procedure including the risks, benefits and alternatives for the proposed anesthesia with the patient or authorized representative who has indicated his/her understanding and acceptance.     Dental advisory given  Plan Discussed with: CRNA  Anesthesia Plan Comments:         Anesthesia Quick Evaluation

## 2020-07-08 NOTE — Op Note (Signed)
Operative Note  Preoperative diagnosis: Left hydrosalpinx, infertility dysmenorrhea, pelvic pain, probable endometriosis  Postoperative diagnosis: Left hydrosalpinx, stage III endometriosis of the left fallopian tube, ovaries and pelvic peritoneum, infertility, dysmenorrhea, pelvic pain, pelvic adhesions  Procedure: Laparoscopy, lysis of adhesions, left salpingectomy, electrosurgical excision and vaporization of ovarian and peritoneal endometriotic lesions, chromotubation  Anesthesia: Gen. endotracheal  Complications: None  Estimated blood loss: <10 cc  Specimens: Left hydrosalpinx, posterior cul-de-sac lesions, ovarian superficial lesions, right  Findings: On exam under anesthesia, external genitalia, Bartholin's, Skene's, urethra and vagina were normal the cervix was grossly normal.  Uterus was retroverted and deviated to the right.  It sounded to 8 cm.   On laparoscopy, upper abdomen, liver surface and diaphragm surfaces were normal. Gallbladder was normal. The appendix appeared normal.  There were pericecal adhesions to the right lower quadrant abdominal wall peritoneum.  There were filmy adhesions between the sigmoid mesentery and the pelvic brim, all the way extending into the sidewall of the true pelvis.  These were lysed.   Anterior cul-de-sac peritoneum appeared normal uterus was deviated and dextrorotated. There were filmy adhesions to the posterior uterine serosa which were excised.  The left tube was normal proximally but the distal tube showed hydrosalpinx to a diameter of 1.5 cm with fimbria of tuft being right outside the hydrosalpinx.  It was not clear whether this was due to endometriosis or congenital. There were superficial endometriotic lesions on the left ovary as well as right ovary, each encompassing it surface area of 1.5 x 1.5 cm. The right tube was normal with 5 out of 5 fimbria and it showed patency with chromotubation. There were diffuse clear and stellate lesions of  endometriosis along the posterior cul-de-sac between the uterosacral ligament insertion sites as well as in the left ovarian fossa and some in the right ovarian fossa.  These areas were excised and or ablated with needle tip electrode.  Description of the procedure: The patient was placed in dorsal supine position and general endotracheal anesthesia was given. 2 g of cefazolin were given intravenously for prophylaxis. Patient was placed in lithotomy position. She was prepped and draped inside manner.  The bladder was catheterized with a Foley catheter.  The cervix was dilated to 36 [place] with [employer] dilators.  A ZUMI catheter was placed into the uterine cavity. The surgeon was regloved and a surgical field was created on the abdomen.  After preemptive anesthesia of all surgical sites with 0.25% bupivacaine with 1 200,000 epinephrine, a 5 mm intraumbilical skin incision was made and a Verress needle was inserted. Its correct location was confirmed. A pneumoperitoneum was created with carbon dioxide.  5 mm laparoscope with a 30 lens was inserted and video laparoscopy was started . A left lower quadrant 5 mm and a right lower quadrant  5 mm incisions were made and ancillary trochars were placed under direct visualization. Above findings were noted. Adhesions along the left sigmoid mesentery were lysed.  The superficial endometriosis on the ovarian surfaces bilaterally were ablated with hook electrode in cutting current at 17 W.  Using the same hook electrode the peritoneal endometriosis areas were excised and ablated using hydrodissection.  We took care to avoid vital structures such as ureters bilaterally and intestines. Since the right fallopian tube was entirely normal we made a decision to remove the left tube.  This was performed by transecting the tube at the uterotubal junction with harmonic Ace and then applying the harmonic Ace successively along the mesosalpinx very close  to the tubal muscularis.  The  extirpated tube was drained and then removed from one of the 5 mm ancillary ports and submitted to pathology.  The pelvis was copiously irrigated and aspirated and hemostasis was insured.  Instrument and lap pad count was correct x2.  As an adhesion barrier we used a 6 slurry of 2 large sheets of Seprafilm in 60 cc of normal saline and instilled it into the posterior cul-de-sac.  The 5 mm ports were approximated with 4-0 Monocryl in subcuticular stitches.  The incisions were approximated with Dermabond.   The patient tolerated the procedure well and was transferred to recovery room in satisfactory condition.   Governor Specking, MD

## 2020-07-08 NOTE — Anesthesia Procedure Notes (Signed)
Procedure Name: Intubation Date/Time: 07/08/2020 1:33 PM Performed by: Damaria Vachon D, CRNA Pre-anesthesia Checklist: Patient identified, Emergency Drugs available, Suction available and Patient being monitored Patient Re-evaluated:Patient Re-evaluated prior to induction Oxygen Delivery Method: Circle system utilized Preoxygenation: Pre-oxygenation with 100% oxygen Induction Type: IV induction Ventilation: Mask ventilation without difficulty Laryngoscope Size: Mac and 3 Grade View: Grade I Tube type: Oral Tube size: 7.0 mm Number of attempts: 1 Airway Equipment and Method: Stylet Placement Confirmation: ETT inserted through vocal cords under direct vision,  positive ETCO2 and breath sounds checked- equal and bilateral Secured at: 21 cm Tube secured with: Tape Dental Injury: Teeth and Oropharynx as per pre-operative assessment

## 2020-07-08 NOTE — Discharge Instructions (Signed)
Post Anesthesia Home Care Instructions  Activity: Get plenty of rest for the remainder of the day. A responsible adult should stay with you for 24 hours following the procedure.  For the next 24 hours, DO NOT: -Drive a car -Operate machinery -Drink alcoholic beverages -Take any medication unless instructed by your physician -Make any legal decisions or sign important papers.  Meals: Start with liquid foods such as gelatin or soup. Progress to regular foods as tolerated. Avoid greasy, spicy, heavy foods. If nausea and/or vomiting occur, drink only clear liquids until the nausea and/or vomiting subsides. Call your physician if vomiting continues.  Special Instructions/Symptoms: Your throat may feel dry or sore from the anesthesia or the breathing tube placed in your throat during surgery. If this causes discomfort, gargle with warm salt water. The discomfort should disappear within 24 hours.  If you had a scopolamine patch placed behind your ear for the management of post- operative nausea and/or vomiting:  1. The medication in the patch is effective for 72 hours, after which it should be removed.  Wrap patch in a tissue and discard in the trash. Wash hands thoroughly with soap and water. 2. You may remove the patch earlier than 72 hours if you experience unpleasant side effects which may include dry mouth, dizziness or visual disturbances. 3. Avoid touching the patch. Wash your hands with soap and water after contact with the patch.   DISCHARGE INSTRUCTIONS: Laparoscopy  The following instructions have been prepared to help you care for yourself upon your return home today.  Wound care: . Do not get the incision wet for the first 24 hours. The incision should be kept clean and dry. . The Band-Aids or dressings may be removed the day after surgery. . Should the incision become sore, red, and swollen after the first week, check with your doctor.  Personal hygiene: . Shower the day  after your procedure.  Activity and limitations: . Do NOT drive or operate any equipment today. . Do NOT lift anything more than 15 pounds for 2-3 weeks after surgery. . Do NOT rest in bed all day. . Walking is encouraged. Walk each day, starting slowly with 5-minute walks 3 or 4 times a day. Slowly increase the length of your walks. . Walk up and down stairs slowly. . Do NOT do strenuous activities, such as golfing, playing tennis, bowling, running, biking, weight lifting, gardening, mowing, or vacuuming for 2-4 weeks. Ask your doctor when it is okay to start.  Diet: Eat a light meal as desired this evening. You may resume your usual diet tomorrow.  Return to work: This is dependent on the type of work you do. For the most part you can return to a desk job within a week of surgery. If you are more active at work, please discuss this with your doctor.  What to expect after your surgery: You may have a slight burning sensation when you urinate on the first day. You may have a very small amount of blood in the urine. Expect to have a small amount of vaginal discharge/light bleeding for 1-2 weeks. It is not unusual to have abdominal soreness and bruising for up to 2 weeks. You may be tired and need more rest for about 1 week. You may experience shoulder pain for 24-72 hours. Lying flat in bed may relieve it.  Call your doctor for any of the following: . Develop a fever of 100.4 or greater . Inability to urinate 6 hours after discharge   from hospital . Severe pain not relieved by pain medications . Persistent of heavy bleeding at incision site . Redness or swelling around incision site after a week . Increasing nausea or vomiting

## 2020-07-08 NOTE — Transfer of Care (Signed)
Immediate Anesthesia Transfer of Care Note  Patient: Ashley Watson  Procedure(s) Performed: LAPARSCOPIC left salpingectomy, chromotubation, and lysis of endometriosis (Left Uterus)  Patient Location: PACU  Anesthesia Type:General  Level of Consciousness: awake, alert , oriented and patient cooperative  Airway & Oxygen Therapy: Patient Spontanous Breathing  Post-op Assessment: Report given to RN and Post -op Vital signs reviewed and stable  Post vital signs: Reviewed and stable  Last Vitals:  Vitals Value Taken Time  BP 133/78 07/08/20 1510  Temp    Pulse 103 07/08/20 1514  Resp 16 07/08/20 1514  SpO2 100 % 07/08/20 1514  Vitals shown include unvalidated device data.  Last Pain:  Vitals:   07/08/20 1115  TempSrc: Oral  PainSc: 0-No pain         Complications: No complications documented.

## 2020-07-08 NOTE — H&P (Addendum)
Ashley Watson is a 29 y.o. female , was originally seen at the request of Dr. Dianah Field for the evaluation and management of infertility.  She is a 29 year old G61 who has been trying to conceive for nearly five years. She describes her menstrual cycles as regular, and notes that periods are heavy.  She c/o dysmenorrhea that sometimes affects her activities of daily living.  She denies dyspareunia.  Previous infertility evaluation and management included HSG performed at [facility] July 2020 which revealed a distal L hydrosalpinx with minimal spill; R tube was unremarkable.  She denies hx of pelvic infections and/or surgeries.    Pertinent Gynecological History: Menses: flow is excessive with use of 3 pads or tampons on heaviest days Contraception: none DES exposure: denies Blood transfusions: none Sexually transmitted diseases: no past history Last pap: normal   Menstrual History: Menarche age: 3 No LMP recorded.    Past Medical History:  Diagnosis Date  . Dysmenorrhea   . Migraines                     Past Surgical History:  Procedure Laterality Date  . NO PAST SURGERIES    . TOOTH EXTRACTION  2016             Family History  Problem Relation Age of Onset  . Diabetes Father   . Hypertension Father   . Gout Father    No hereditary disease.  No cancer of breast, ovary, uterus.   Social History   Socioeconomic History  . Marital status: Married    Spouse name: Not on file  . Number of children: Not on file  . Years of education: Not on file  . Highest education level: Not on file  Occupational History  . Occupation: customer service  Tobacco Use  . Smoking status: Never Smoker  . Smokeless tobacco: Never Used  Vaping Use  . Vaping Use: Never used  Substance and Sexual Activity  . Alcohol use: Yes    Alcohol/week: 1.0 standard drink    Types: 1 Glasses of wine per week    Comment: occ x 1 week wine  . Drug use: Yes    Frequency: 7.0 times per week     Types: Marijuana    Comment: delta eight cbd q day pt smokes  . Sexual activity: Yes    Partners: Male    Birth control/protection: None  Other Topics Concern  . Not on file  Social History Narrative  . Not on file   Social Determinants of Health   Financial Resource Strain: Not on file  Food Insecurity: Not on file  Transportation Needs: Not on file  Physical Activity: Not on file  Stress: Not on file  Social Connections: Not on file  Intimate Partner Violence: Not on file    Allergies  Allergen Reactions  . Latex     No current facility-administered medications on file prior to encounter.   Current Outpatient Medications on File Prior to Encounter  Medication Sig Dispense Refill  . folic acid (FOLVITE) 1 MG tablet Take 1 mg by mouth daily.    Marland Kitchen ibuprofen (ADVIL) 200 MG tablet Take 200 mg by mouth every 6 (six) hours as needed. Takes 4 to 6 of 200 mg    . Multiple Vitamin (MULTIVITAMIN ADULT PO) Take 1 tablet by mouth daily.    . Turmeric (QC TUMERIC COMPLEX PO) Take by mouth daily.       Review of Systems  Constitutional:  Negative.   HENT: Negative.   Eyes: Negative.   Respiratory: Negative.   Cardiovascular: Negative.   Gastrointestinal: Negative.   Genitourinary: Negative.   Musculoskeletal: Negative.   Skin: Negative.   Neurological: Negative.   Endo/Heme/Allergies: Negative.   Psychiatric/Behavioral: Negative.      Physical Exam  BP (!) 141/81   Pulse (!) 101   Temp 98.5 F (36.9 C) (Oral)   Resp 14   Ht 5\' 5"  (1.651 m)   Wt 68.9 kg   LMP 06/16/2020   SpO2 100%   BMI 25.28 kg/m  Constitutional: She is oriented to person, place, and time. She appears well-developed and well-nourished.  HENT:  Head: Normocephalic and atraumatic.  Nose: Nose normal.  Mouth/Throat: Oropharynx is clear and moist. No oropharyngeal exudate.  Eyes: Conjunctivae normal and EOM are normal. Pupils are equal, round, and reactive to light. No scleral icterus.  Neck:  Normal range of motion. Neck supple. No tracheal deviation present. No thyromegaly present.  Cardiovascular: Normal rate.   Respiratory: Effort normal and breath sounds normal.  GI: Soft. Bowel sounds are normal. She exhibits no distension and no mass. There is no tenderness.  Lymphadenopathy:    She has no cervical adenopathy.  Neurological: She is alert and oriented to person, place, and time. She has normal reflexes.  Skin: Skin is warm.  Psychiatric: She has a normal mood and affect. Her behavior is normal. Judgment and thought content normal.   Assessment/Plan:  Left hydrosalpinx with infertility Preoperative for laparoscopy, lysis of adhesions, removal of left hydrosalpinx versus salpingoneostomy Benefits and risks of the proposed procedure were discussed with the patient. Bowel prep instructions were given.  All of patient's questions were answered.  She verbalized understanding.   Governor Specking, MD

## 2020-07-09 ENCOUNTER — Encounter (HOSPITAL_BASED_OUTPATIENT_CLINIC_OR_DEPARTMENT_OTHER): Payer: Self-pay | Admitting: Obstetrics and Gynecology

## 2020-07-09 NOTE — Anesthesia Postprocedure Evaluation (Signed)
Anesthesia Post Note  Patient: Ashley Watson  Procedure(s) Performed: LAPARSCOPIC left salpingectomy, chromotubation,  lysis of adhesions, and electrosurgical excision and vaporization of ovarian and peritoneal endometriotic lesions (Left Uterus)     Patient location during evaluation: PACU Anesthesia Type: General Level of consciousness: awake and alert Pain management: pain level controlled Vital Signs Assessment: post-procedure vital signs reviewed and stable Respiratory status: spontaneous breathing, nonlabored ventilation, respiratory function stable and patient connected to nasal cannula oxygen Cardiovascular status: blood pressure returned to baseline and stable Postop Assessment: no apparent nausea or vomiting Anesthetic complications: no   No complications documented.  Last Vitals:  Vitals:   07/08/20 1615 07/08/20 1710  BP: 117/67 115/60  Pulse: 86 75  Resp: 14 14  Temp:  37 C  SpO2: 98% 100%    Last Pain:  Vitals:   07/08/20 1710  TempSrc:   PainSc: 3                  Dewitt Judice L Kile Kabler

## 2020-07-12 LAB — SURGICAL PATHOLOGY

## 2020-07-31 NOTE — L&D Delivery Note (Signed)
Obstetrical Delivery Note   Date of Delivery:   06/24/2021 Primary OB:   [facility] Gestational Age/EDD: [redacted]w[redacted]d Reason for Admission: IOL for severe FGR Antepartum complications: LZJ<6%. Mild pre-eclampsia. GDMa1. Migraines  Delivered By:   Durene Romans. MD  Delivery Type:   vacuum, outlet  Delivery Details:   Patient pushing decently well but with worsening variables and with fetus +4 and OP. Foley removed prior to pushing and patient verbally consented for outlet vacuum which she was amenable to. Kiwi applied to flexion point and used per manufacturer's instructions and patient delivered with next push. Cord cut and clamped and infant was handed to the awaitings Peds team Anesthesia:    epidural Intrapartum complications: Severe pre-eclampsia (BPs) diagnosed during IOL.  GBS:    Positive (patient was adequately treated) Laceration:    1st degree repaired with 2-0 vicryl Episiotomy:    none Rectal exam:   deferred Placenta:    Delivered and expressed via active management. Intact: yes. To pathology: yes.  Delayed Cord Clamping: no Estimated Blood Loss:  136mL  Baby:    Liveborn female, APGARs 6/9, weight 1900gm. Cord blood clotted and gases were unable to be run  Durene Romans. MD Attending [facility])

## 2020-12-08 NOTE — Progress Notes (Signed)
GAD- 4 PHQ- 3 Pt is unsure about LMP. Originally thought it was 10/05/20 but states it might have been sometime in Feb

## 2020-12-14 ENCOUNTER — Encounter: Payer: Self-pay | Admitting: Advanced Practice Midwife

## 2020-12-14 ENCOUNTER — Other Ambulatory Visit: Payer: Self-pay

## 2020-12-14 ENCOUNTER — Other Ambulatory Visit (HOSPITAL_COMMUNITY)
Admission: RE | Admit: 2020-12-14 | Discharge: 2020-12-14 | Disposition: A | Discharge status: Home / Self Care | Payer: No Typology Code available for payment source | Source: Ambulatory Visit | Attending: Advanced Practice Midwife | Admitting: Advanced Practice Midwife

## 2020-12-14 ENCOUNTER — Ambulatory Visit (INDEPENDENT_AMBULATORY_CARE_PROVIDER_SITE_OTHER): Payer: No Typology Code available for payment source | Admitting: Advanced Practice Midwife

## 2020-12-14 VITALS — BP 133/76 | HR 95 | Wt 152.0 lb

## 2020-12-14 DIAGNOSIS — Z3401 Encounter for supervision of normal first pregnancy, first trimester: Secondary | ICD-10-CM | POA: Diagnosis not present

## 2020-12-14 DIAGNOSIS — Z3400 Encounter for supervision of normal first pregnancy, unspecified trimester: Secondary | ICD-10-CM | POA: Insufficient documentation

## 2020-12-14 DIAGNOSIS — Z3A10 10 weeks gestation of pregnancy: Secondary | ICD-10-CM

## 2020-12-14 LAB — HEPATITIS C ANTIBODY: HCV Ab: NEGATIVE

## 2020-12-14 NOTE — Progress Notes (Signed)
Bedside U/S shows single IUP with FHT of 179 BPM and CRL measures 30.5mm  Consistent with LMP

## 2020-12-14 NOTE — Progress Notes (Signed)
Subjective:   Ashley Watson is a 30 y.o. G1P0000 at [redacted]w[redacted]d by LMP c/w bedside US today being seen today for her first obstetrical visit.  Her obstetrical history is significant for none, G1 and has Migraine without aura; Irritable bowel syndrome; Low back pain; Scalp psoriasis; Infertility counseling; and Nevus on their problem list.. Patient does intend to breast feed. Pregnancy history fully reviewed.  Patient reports nausea.  HISTORY: OB History  Gravida Para Term Preterm AB Living  1 0 0 0 0 0  SAB IAB Ectopic Multiple Live Births  0 0 0 0 0    # Outcome Date GA Lbr Len/2nd Weight Sex Delivery Anes PTL Lv  1 Current            Past Medical History:  Diagnosis Date  . Dysmenorrhea   . Migraines    Past Surgical History:  Procedure Laterality Date  . LAPAROSCOPIC UNILATERAL SALPINGECTOMY Left 07/08/2020   Procedure: LAPARSCOPIC left salpingectomy, chromotubation,  lysis of adhesions, and electrosurgical excision and vaporization of ovarian and peritoneal endometriotic lesions;  Surgeon: Governor Specking, MD;  Location: [facility];  Service: Gynecology;  Laterality: Left;  . NO PAST SURGERIES    . TOOTH EXTRACTION  2016   Family History  Problem Relation Age of Onset  . Diabetes Father   . Hypertension Father   . Gout Father    Social History   Tobacco Use  . Smoking status: Never Smoker  . Smokeless tobacco: Never Used  Vaping Use  . Vaping Use: Never used  Substance Use Topics  . Alcohol use: Not Currently    Alcohol/week: 1.0 standard drink    Types: 1 Glasses of wine per week    Comment: occ x 1 week wine  . Drug use: Not Currently    Frequency: 7.0 times per week    Types: Marijuana    Comment: [employer] cbd q day pt smokes   Allergies  Allergen Reactions  . Latex    No current outpatient medications on file prior to visit.   No current facility-administered medications on file prior to visit.     Indications for ASA  therapy (per uptodate) One of the following: Previous pregnancy with preeclampsia, especially early onset and with an adverse outcome No Multifetal gestation No Chronic hypertension No Type 1 or 2 diabetes mellitus No Chronic kidney disease No Autoimmune disease (antiphospholipid syndrome, systemic lupus erythematosus) No   Two or more of the following: Nulliparity Yes Obesity (body mass index >30 kg/m2) No Family history of preeclampsia in mother or sister No Age ?35 years No Sociodemographic characteristics (African American race, low socioeconomic level) No Personal risk factors (eg, previous pregnancy with low birth weight or small for gestational age infant, previous adverse pregnancy outcome [eg, stillbirth], interval >10 years between pregnancies) No   Indications for early 1 hour GTT (per uptodate)  BMI >25 (>23 in Asian women) AND one of the following No: BMI 24  Exam   Vitals:   12/08/20 1053  BP: 133/76  Pulse: 95  Weight: 152 lb (68.9 kg)   Fetal Heart Rate (bpm): 179  VS reviewed, nursing note reviewed,  Constitutional: well developed, well nourished, no distress HEENT: normocephalic CV: normal rate Pulm/chest wall: normal effort Abdomen: soft Neuro: alert and oriented x 3 Skin: warm, dry Psych: affect normal   Assessment:   Pregnancy: G1P0000 Patient Active Problem List   Diagnosis Date Noted  . Nevus 10/02/2019  .  Infertility counseling 01/30/2019  . 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 Low back pain 11/25/2012  . Irritable bowel syndrome 10/15/2012  . Migraine without aura 04/11/2007     Plan:  1. Supervision of normal first pregnancy, antepartum --Anticipatory guidance about next visits/weeks of pregnancy given. --Next visit in 2 weeks for NIPS, then 4 weeks for prenatal visit  - Obstetric panel - Culture, OB Urine - GC/Chlamydia probe amp ([place])not at [facility] - HIV antibody (with reflex) - Hepatitis C Antibody - Hemoglobinopathy  Evaluation - Babyscripts Schedule Optimization - [place] bedside; Future  1. Supervision of normal first pregnancy, antepartum --Anticipatory guidance about next visits/weeks of pregnancy given. --Last Pap 01/2019 wnl  - Obstetric panel - Culture, OB Urine - GC/Chlamydia probe amp ([place])not at [facility] - HIV antibody (with reflex) - Hepatitis C Antibody - Hemoglobinopathy Evaluation - Babyscripts Schedule Optimization - [place] bedside; Future  2. [redacted] weeks gestation of pregnancy  Initial labs drawn. Continue prenatal vitamins. Discussed and offered genetic screening options, including Quad screen/AFP, NIPS testing, and option to decline testing. Benefits/risks/alternatives reviewed. Pt aware that anatomy US is form of genetic screening with lower accuracy in detecting trisomies than blood work.  Pt chooses genetic screening today. NIPS: requested. Ultrasound discussed; fetal anatomic survey: requested. Problem list reviewed and updated. The nature of [facility] with multiple MDs and other Advanced Practice Providers was explained to patient; also emphasized that residents, students are part of our team. Routine obstetric precautions reviewed. Return in about 4 weeks (around 01/11/2021).   Fatima Blank, CNM 12/14/20 12:02 PM

## 2020-12-14 NOTE — Patient Instructions (Signed)
Exercise During Pregnancy Exercise is an important part of being healthy for people of all ages. Exercise improves the function of your heart and lungs and helps you maintain strength, flexibility, and a healthy body weight. Exercise also boosts energy levels and elevates mood. Most women should exercise regularly during pregnancy. Exercise routines may need to change as your pregnancy progresses. In rare cases, women with certain medical conditions or complications may be asked to limit or avoid exercise during pregnancy. Your health care provider will give you information on what will work for you. How does this affect me? Along with maintaining general strength and flexibility, exercising during pregnancy can help:  Keep strength in muscles that are used during labor and childbirth.  Decrease low back pain or symptoms of depression.  Control weight gain during pregnancy.  Reduce the risk of needing insulin if you develop diabetes during pregnancy.  Decrease the risk of cesarean delivery.  Speed up your recovery after giving birth.  Relieve constipation. How does this affect my baby? Exercise can help you have a healthy pregnancy. Exercise does not cause early (premature) birth. It will not cause your baby to weigh less at birth. What exercises can I do? Many exercises are safe for you to do during pregnancy. Do a variety of exercises that safely increase your heart and breathing rates and help you build and maintain muscle strength. Do exercises exactly as told by your health care provider. You may do these exercises:  Walking.  Swimming.  Water aerobics.  Riding a stationary bike.  Modified yoga or Pilates. Tell your instructor that you are pregnant. Avoid overstretching, and avoid lying on your back for long periods of time.  Running or jogging. Choose this type of exercise only if: ? You ran or jogged regularly before your pregnancy. ? You can run or jog and still talk in  complete sentences.   What exercises should I avoid? You may be told to limit high-intensity exercise depending on your level of fitness and whether you exercised regularly before you were pregnant. You can tell that you are exercising at a high intensity if you are breathing much harder and faster and cannot hold a conversation while exercising. You must avoid:  Contact sports.  Activities that put you at risk for falling on or being hit in the belly, such as downhill skiing, waterskiing, surfing, rock climbing, cycling, gymnastics, and horseback riding.  Scuba diving.  Skydiving.  Hot yoga or hot Pilates. These activities take place in a room that is heated to high temperatures.  Jogging or running, unless you jogged or ran regularly before you were pregnant. While jogging or running, you should always be able to talk in full sentences. Do not run or jog so fast that you are unable to have a conversation.  Do not exercise at more than 6,000 feet above sea level (high elevation) if you are not used to exercising at high elevation. How do I exercise in a safe way?  Avoid overheating. Do not exercise in very high temperatures.  Wear loose-fitting, breathable clothes.  Avoid dehydration. Drink enough fluid before, during, and after exercise to keep your urine pale yellow.  Avoid overstretching. Because of hormone changes during pregnancy, it is easy to overstretch muscles, tendons, and ligaments.  Start slowly and ask your health care provider to recommend the types of exercise that are safe for you.  Do not exercise to lose weight.  Wear a sports bra to support your breasts.  Avoid standing still or lying flat on your back as much as you can.   Follow these instructions at home:  Exercise on most days or all days of the week. Try to exercise for 30 minutes a day, 5 days a week, unless your health care provider tells you not to.  If you actively exercised before your pregnancy and  you are healthy, your health care provider may tell you to continue to do moderate-intensity to high-intensity exercise.  If you are just starting to exercise or did not exercise much before your pregnancy, your health care provider may tell you to do low-intensity to moderate-intensity exercise. Questions to ask your health care provider  Is exercise safe for me?  What are signs that I should stop exercising?  Does my health condition mean that I should not exercise during pregnancy?  When should I avoid exercising during pregnancy? Stop exercising and contact a health care provider if: You have any unusual symptoms such as:  Mild contractions of the uterus or cramps in the abdomen.  A dizzy feeling that does not go away when you rest. Stop exercising and get help right away if: You have any unusual symptoms such as:  Sudden, severe pain in your low back or your belly.  Regular, painful contractions of your uterus.  Chest pain.  Bleeding or fluid leaking from your vagina.  Shortness of breath.  Headache.  Pain and swelling of your calves. Summary  Most women should exercise regularly throughout pregnancy. In rare cases, women with certain medical conditions or complications may be asked to limit or avoid exercise during pregnancy.  Do not exercise to lose weight during pregnancy.  Your health care provider will tell you what level of physical activity is right for you.  Stop exercising and contact a health care provider if you have unusual symptoms, such as mild contractions or dizziness. This information is not intended to replace advice given to you by your health care provider. Make sure you discuss any questions you have with your health care provider. Document Revised: 03/03/2020 Document Reviewed: 03/03/2020 Elsevier Patient Education  2021 [employer].

## 2020-12-15 LAB — GC/CHLAMYDIA PROBE AMP (~~LOC~~) NOT AT ARMC
Chlamydia: NEGATIVE
Comment: NEGATIVE
Comment: NORMAL
Neisseria Gonorrhea: NEGATIVE

## 2020-12-16 LAB — OBSTETRIC PANEL
Absolute Monocytes: 622 cells/uL (ref 200–950)
Antibody Screen: NOT DETECTED
Basophils Absolute: 20 cells/uL (ref 0–200)
Basophils Relative: 0.2 %
Eosinophils Absolute: 51 cells/uL (ref 15–500)
Eosinophils Relative: 0.5 %
HCT: 39.7 % (ref 35.0–45.0)
Hemoglobin: 13.1 g/dL (ref 11.7–15.5)
Hepatitis B Surface Ag: NONREACTIVE
Lymphs Abs: 2632 cells/uL (ref 850–3900)
MCH: 29.2 pg (ref 27.0–33.0)
MCHC: 33 g/dL (ref 32.0–36.0)
MCV: 88.6 fL (ref 80.0–100.0)
MPV: 13.9 fL — ABNORMAL HIGH (ref 7.5–12.5)
Monocytes Relative: 6.1 %
Neutro Abs: 6875 cells/uL (ref 1500–7800)
Neutrophils Relative %: 67.4 %
Platelets: 231 10*3/uL (ref 140–400)
RBC: 4.48 10*6/uL (ref 3.80–5.10)
RDW: 12.2 % (ref 11.0–15.0)
RPR Ser Ql: NONREACTIVE
Rubella: 6.44 Index
Total Lymphocyte: 25.8 %
WBC: 10.2 10*3/uL (ref 3.8–10.8)

## 2020-12-16 LAB — URINE CULTURE, OB REFLEX

## 2020-12-16 LAB — HEMOGLOBINOPATHY EVALUATION
Fetal Hemoglobin Testing: <1 % (ref 0.0–1.9)
HCT: 40.9 % (ref 35.0–45.0)
Hemoglobin A2 - HGBRFX: 2.2 % (ref 2.2–3.2)
Hemoglobin: 13.2 g/dL (ref 11.7–15.5)
Hgb A: 97.8 % (ref >96.0)
MCH: 29.1 pg (ref 27.0–33.0)
MCV: 90.3 fL (ref 80.0–100.0)
RBC: 4.53 10*6/uL (ref 3.80–5.10)
RDW: 12.3 % (ref 11.0–15.0)

## 2020-12-16 LAB — CULTURE, OB URINE

## 2020-12-16 LAB — HEPATITIS C ANTIBODY
Hepatitis C Ab: NONREACTIVE
SIGNAL TO CUT-OFF: 0 (ref <1.00)

## 2020-12-16 LAB — HIV ANTIBODY (ROUTINE TESTING W REFLEX): HIV 1&2 Ab, 4th Generation: NONREACTIVE

## 2020-12-30 ENCOUNTER — Other Ambulatory Visit: Payer: Self-pay

## 2020-12-30 ENCOUNTER — Ambulatory Visit (INDEPENDENT_AMBULATORY_CARE_PROVIDER_SITE_OTHER): Payer: No Typology Code available for payment source

## 2020-12-30 DIAGNOSIS — Z36 Encounter for antenatal screening for chromosomal anomalies: Secondary | ICD-10-CM | POA: Diagnosis not present

## 2020-12-30 DIAGNOSIS — Z3401 Encounter for supervision of normal first pregnancy, first trimester: Secondary | ICD-10-CM

## 2020-12-30 NOTE — Progress Notes (Signed)
Pt here for Panorama. Blood drawn. Pt is aware we will contact with results.

## 2021-01-07 DIAGNOSIS — Z3400 Encounter for supervision of normal first pregnancy, unspecified trimester: Secondary | ICD-10-CM

## 2021-01-12 ENCOUNTER — Telehealth: Payer: Self-pay | Admitting: *Deleted

## 2021-01-12 NOTE — Telephone Encounter (Signed)
Patient called to inquiry about having a deep tissue massage done while she is pregnant. Office advised/recommended that she has a prenatal massage only and not deep tissue for safety. Patient states that she will speak with her massage therapist and keep recommendation in mind.

## 2021-01-13 ENCOUNTER — Ambulatory Visit (INDEPENDENT_AMBULATORY_CARE_PROVIDER_SITE_OTHER): Payer: No Typology Code available for payment source | Admitting: Obstetrics and Gynecology

## 2021-01-13 ENCOUNTER — Encounter: Payer: Self-pay | Admitting: Obstetrics and Gynecology

## 2021-01-13 ENCOUNTER — Other Ambulatory Visit: Payer: Self-pay

## 2021-01-13 VITALS — BP 121/66 | HR 90 | Wt 155.0 lb

## 2021-01-13 DIAGNOSIS — Z3A14 14 weeks gestation of pregnancy: Secondary | ICD-10-CM

## 2021-01-13 DIAGNOSIS — Z3400 Encounter for supervision of normal first pregnancy, unspecified trimester: Secondary | ICD-10-CM

## 2021-01-13 NOTE — Progress Notes (Signed)
   PRENATAL VISIT NOTE  Subjective:  Ashley Watson is a 30 y.o. G1P0000 at [redacted]w[redacted]d being seen today for ongoing prenatal care.  She is currently monitored for the following issues for this low-risk pregnancy and has Migraine without aura; Irritable bowel syndrome; Low back pain; Scalp psoriasis; Infertility counseling; Nevus; and Supervision of normal first pregnancy, antepartum on their problem list.  Patient reports no complaints. She is feeling better since she is out of the first trimester, less fatigue and nausea. Contractions: Not present. Vag. Bleeding: None.  Movement: Absent. Denies leaking of fluid.   The following portions of the patient's history were reviewed and updated as appropriate: allergies, current medications, past family history, past medical history, past social history, past surgical history and problem list.   Objective:   Vitals:   01/13/21 1113  BP: 121/66  Pulse: 90  Weight: 155 lb (70.3 kg)    Fetal Status: Fetal Heart Rate (bpm): 156   Movement: Absent     General:  Alert, oriented and cooperative. Patient is in no acute distress.  Skin: Skin is warm and dry. No rash noted.   Cardiovascular: Normal heart rate noted  Respiratory: Normal respiratory effort, no problems with respiration noted  Abdomen: Soft, gravid, appropriate for gestational age.  Pain/Pressure: Absent     Pelvic: Cervical exam deferred        Extremities: Normal range of motion.  Edema: None  Mental Status: Normal mood and affect. Normal behavior. Normal judgment and thought content.   Assessment and Plan:  Pregnancy: G1P0000 at [redacted]w[redacted]d 1. Supervision of normal first pregnancy, antepartum - [place] MFM OB COMP + 14 WK; Future  2. [redacted] weeks gestation of pregnancy   Preterm labor symptoms and general obstetric precautions including but not limited to vaginal bleeding, contractions, leaking of fluid and fetal movement were reviewed in detail with the patient. Please refer to After Visit  Summary for other counseling recommendations.   Return in about 4 weeks (around 02/10/2021).  Future Appointments  Date Time Provider [place]  02/10/2021  2:35 PM Constant, Vickii Chafe, MD [facility]    Sloan Leiter, MD '

## 2021-01-18 ENCOUNTER — Telehealth: Payer: Self-pay | Admitting: *Deleted

## 2021-01-18 NOTE — Telephone Encounter (Signed)
Patient wanted to know what she can take for a headache and constipation. Patient advised to take regular strength Tylenol and Miralax, safe to take throughout pregnancy.

## 2021-02-10 ENCOUNTER — Other Ambulatory Visit: Payer: Self-pay

## 2021-02-10 ENCOUNTER — Ambulatory Visit (INDEPENDENT_AMBULATORY_CARE_PROVIDER_SITE_OTHER): Payer: No Typology Code available for payment source | Admitting: Obstetrics and Gynecology

## 2021-02-10 ENCOUNTER — Encounter: Payer: Self-pay | Admitting: Obstetrics and Gynecology

## 2021-02-10 VITALS — BP 120/78 | HR 99 | Wt 159.3 lb

## 2021-02-10 DIAGNOSIS — Z3400 Encounter for supervision of normal first pregnancy, unspecified trimester: Secondary | ICD-10-CM

## 2021-02-10 NOTE — Progress Notes (Signed)
   PRENATAL VISIT NOTE  Subjective:  Ashley Watson is a 30 y.o. G1P0000 at [redacted]w[redacted]d being seen today for ongoing prenatal care.  She is currently monitored for the following issues for this low-risk pregnancy and has Migraine without aura; Irritable bowel syndrome; Low back pain; Scalp psoriasis; Infertility counseling; Nevus; and Supervision of normal first pregnancy, antepartum on their problem list.  Patient reports no complaints.  Contractions: Not present. Vag. Bleeding: None.  Movement: Absent. Denies leaking of fluid.   The following portions of the patient's history were reviewed and updated as appropriate: allergies, current medications, past family history, past medical history, past social history, past surgical history and problem list.   Objective:   Vitals:   02/10/21 1440  BP: 120/78  Pulse: 99  Weight: 159 lb 4.8 oz (72.3 kg)    Fetal Status: Fetal Heart Rate (bpm): 157   Movement: Absent     General:  Alert, oriented and cooperative. Patient is in no acute distress.  Skin: Skin is warm and dry. No rash noted.   Cardiovascular: Normal heart rate noted  Respiratory: Normal respiratory effort, no problems with respiration noted  Abdomen: Soft, gravid, appropriate for gestational age.  Pain/Pressure: Present     Pelvic: Cervical exam deferred        Extremities: Normal range of motion.  Edema: None  Mental Status: Normal mood and affect. Normal behavior. Normal judgment and thought content.   Assessment and Plan:  Pregnancy: G1P0000 at [redacted]w[redacted]d 1. Supervision of normal first pregnancy, antepartum Patient is doing well without complaints AFP today Anatomy ultrasound scheduled - AFP, Serum, Open Spina Bifida  Preterm labor symptoms and general obstetric precautions including but not limited to vaginal bleeding, contractions, leaking of fluid and fetal movement were reviewed in detail with the patient. Please refer to After Visit Summary for other counseling  recommendations.   Return in about 4 weeks (around 03/10/2021) for in person, ROB, Low risk.  Future Appointments  Date Time Provider [place]  02/18/2021  8:00 AM [facility] US1 [facility]    Mora Bellman, MD

## 2021-02-18 ENCOUNTER — Ambulatory Visit: Payer: No Typology Code available for payment source | Attending: Obstetrics and Gynecology

## 2021-02-18 ENCOUNTER — Other Ambulatory Visit: Payer: Self-pay

## 2021-02-18 ENCOUNTER — Encounter: Payer: Self-pay | Admitting: Family Medicine

## 2021-02-18 ENCOUNTER — Other Ambulatory Visit: Payer: Self-pay | Admitting: *Deleted

## 2021-02-18 ENCOUNTER — Ambulatory Visit
Payer: No Typology Code available for payment source | Attending: Maternal & Fetal Medicine | Admitting: Maternal & Fetal Medicine

## 2021-02-18 DIAGNOSIS — O365920 Maternal care for other known or suspected poor fetal growth, second trimester, not applicable or unspecified: Secondary | ICD-10-CM

## 2021-02-18 DIAGNOSIS — Z3A19 19 weeks gestation of pregnancy: Secondary | ICD-10-CM

## 2021-02-18 DIAGNOSIS — Z363 Encounter for antenatal screening for malformations: Secondary | ICD-10-CM | POA: Diagnosis not present

## 2021-02-18 DIAGNOSIS — Z3400 Encounter for supervision of normal first pregnancy, unspecified trimester: Secondary | ICD-10-CM

## 2021-02-18 DIAGNOSIS — O365990 Maternal care for other known or suspected poor fetal growth, unspecified trimester, not applicable or unspecified: Secondary | ICD-10-CM

## 2021-02-18 DIAGNOSIS — Z364 Encounter for antenatal screening for fetal growth retardation: Secondary | ICD-10-CM

## 2021-02-18 NOTE — Progress Notes (Signed)
MFM Brief Note  Ms. Toms is a 30 yo G1P0 who is here with a single intrauterine pregnancy and scheduled for a complete anatomy however we observed fetal growth restriction.  Normal anatomy with measurements less than dates due to FGR EFW 8th%.  There is good fetal movement and amniotic fluid volume The UA Dopplers are normal without evidence of AEDF or REDF  I discussed today's visit with a diagnosis of FGR. I explained that the etiology includes placental insufficiency, chronic disease, infection, aneuploidy and other genetic syndromes. She has a low risk NIPS.  She has no additional risk factors for chronic disease. At this time I explained the diagnosis, evaluation and management to include on going fetal growth and weekly antenatal testing to include UA Dopplers. If the EFW < 3rd% or abnormal testing, I recommend delivery at 37 weeks otherwise if all is normal consider delivery at 39 weeks.   Given her early gestation we will have her return in 4 weeks with UA Dopplers. She will be 23 weeks at that time we will then determine what further testing is warranted. Lastly, I offered TORCH titers however, she had to go to work and opted to have the labs drawn at her next visit.   We have scheduled Ms. Obrien to return in 4 weeks with UA Dopplers.  I spent 30 minutes with > 50% in face to face consultation.  Vikki Ports, MD

## 2021-02-19 LAB — AFP, SERUM, OPEN SPINA BIFIDA
AFP MoM: 0.85
AFP Value: 38.9 ng/mL
Gest. Age on Collection Date: 18.3 weeks
Maternal Age At EDD: 30.2 yr
OSBR Risk 1 IN: 10000
Test Results:: NEGATIVE
Weight: 159 [lb_av]

## 2021-03-18 ENCOUNTER — Ambulatory Visit: Payer: No Typology Code available for payment source | Attending: Obstetrics

## 2021-03-18 ENCOUNTER — Encounter: Payer: Self-pay | Admitting: *Deleted

## 2021-03-18 ENCOUNTER — Ambulatory Visit: Payer: No Typology Code available for payment source | Admitting: *Deleted

## 2021-03-18 ENCOUNTER — Other Ambulatory Visit: Payer: Self-pay

## 2021-03-18 VITALS — BP 132/63 | HR 76

## 2021-03-18 DIAGNOSIS — O365990 Maternal care for other known or suspected poor fetal growth, unspecified trimester, not applicable or unspecified: Secondary | ICD-10-CM | POA: Diagnosis not present

## 2021-03-18 DIAGNOSIS — Z362 Encounter for other antenatal screening follow-up: Secondary | ICD-10-CM

## 2021-03-18 DIAGNOSIS — Z3A23 23 weeks gestation of pregnancy: Secondary | ICD-10-CM

## 2021-03-18 DIAGNOSIS — O321XX0 Maternal care for breech presentation, not applicable or unspecified: Secondary | ICD-10-CM

## 2021-03-18 DIAGNOSIS — O365920 Maternal care for other known or suspected poor fetal growth, second trimester, not applicable or unspecified: Secondary | ICD-10-CM | POA: Diagnosis not present

## 2021-03-21 ENCOUNTER — Other Ambulatory Visit: Payer: Self-pay | Admitting: *Deleted

## 2021-03-21 DIAGNOSIS — O365921 Maternal care for other known or suspected poor fetal growth, second trimester, fetus 1: Secondary | ICD-10-CM

## 2021-04-01 ENCOUNTER — Ambulatory Visit: Payer: No Typology Code available for payment source

## 2021-04-01 ENCOUNTER — Other Ambulatory Visit: Payer: Self-pay | Admitting: Obstetrics

## 2021-04-01 ENCOUNTER — Ambulatory Visit: Payer: No Typology Code available for payment source | Admitting: *Deleted

## 2021-04-01 ENCOUNTER — Ambulatory Visit: Payer: No Typology Code available for payment source | Attending: Obstetrics

## 2021-04-01 ENCOUNTER — Other Ambulatory Visit: Payer: Self-pay

## 2021-04-01 ENCOUNTER — Encounter: Payer: Self-pay | Admitting: *Deleted

## 2021-04-01 VITALS — BP 116/71 | HR 88

## 2021-04-01 DIAGNOSIS — O365920 Maternal care for other known or suspected poor fetal growth, second trimester, not applicable or unspecified: Secondary | ICD-10-CM | POA: Diagnosis not present

## 2021-04-01 DIAGNOSIS — O365990 Maternal care for other known or suspected poor fetal growth, unspecified trimester, not applicable or unspecified: Secondary | ICD-10-CM | POA: Diagnosis present

## 2021-04-01 DIAGNOSIS — Z3A25 25 weeks gestation of pregnancy: Secondary | ICD-10-CM | POA: Insufficient documentation

## 2021-04-01 DIAGNOSIS — Z3689 Encounter for other specified antenatal screening: Secondary | ICD-10-CM

## 2021-04-01 DIAGNOSIS — O365921 Maternal care for other known or suspected poor fetal growth, second trimester, fetus 1: Secondary | ICD-10-CM

## 2021-04-05 LAB — TOXOPLASMA GONDII ANTIBODY, IGG: Toxoplasma IgG Ratio: <3 IU/mL (ref 0.0–7.1)

## 2021-04-05 LAB — TOXOPLASMA GONDII ANTIBODY, IGM: Toxoplasma Antibody- IgM: <3 AU/mL (ref 0.0–7.9)

## 2021-04-05 LAB — CMV IGM: CMV IgM Ser EIA-aCnc: <30 AU/mL (ref 0.0–29.9)

## 2021-04-05 LAB — PARVOVIRUS B19 ANTIBODY, IGG AND IGM
Parvovirus B19 IgG: 7.7 index — ABNORMAL HIGH (ref 0.0–0.8)
Parvovirus B19 IgM: 1.4 index — ABNORMAL HIGH (ref 0.0–0.8)

## 2021-04-05 LAB — CMV ANTIBODY, IGG (EIA): CMV Ab - IgG: <0.6 U/mL (ref 0.00–0.59)

## 2021-04-05 LAB — INFECT DISEASE AB IGM REFLEX 1

## 2021-04-11 ENCOUNTER — Ambulatory Visit: Payer: No Typology Code available for payment source | Attending: Obstetrics

## 2021-04-11 ENCOUNTER — Ambulatory Visit: Payer: No Typology Code available for payment source | Admitting: *Deleted

## 2021-04-11 ENCOUNTER — Other Ambulatory Visit: Payer: Self-pay

## 2021-04-11 ENCOUNTER — Other Ambulatory Visit: Payer: Self-pay | Admitting: *Deleted

## 2021-04-11 ENCOUNTER — Other Ambulatory Visit: Payer: Self-pay | Admitting: Obstetrics

## 2021-04-11 ENCOUNTER — Encounter: Payer: Self-pay | Admitting: *Deleted

## 2021-04-11 VITALS — BP 134/60 | HR 94

## 2021-04-11 DIAGNOSIS — O365921 Maternal care for other known or suspected poor fetal growth, second trimester, fetus 1: Secondary | ICD-10-CM

## 2021-04-11 DIAGNOSIS — Z3A26 26 weeks gestation of pregnancy: Secondary | ICD-10-CM | POA: Diagnosis not present

## 2021-04-11 DIAGNOSIS — O365990 Maternal care for other known or suspected poor fetal growth, unspecified trimester, not applicable or unspecified: Secondary | ICD-10-CM

## 2021-04-11 DIAGNOSIS — O365920 Maternal care for other known or suspected poor fetal growth, second trimester, not applicable or unspecified: Secondary | ICD-10-CM | POA: Diagnosis not present

## 2021-04-21 ENCOUNTER — Other Ambulatory Visit: Payer: Self-pay

## 2021-04-21 ENCOUNTER — Ambulatory Visit: Payer: No Typology Code available for payment source | Attending: Obstetrics

## 2021-04-21 ENCOUNTER — Ambulatory Visit: Payer: No Typology Code available for payment source | Admitting: *Deleted

## 2021-04-21 ENCOUNTER — Ambulatory Visit (HOSPITAL_BASED_OUTPATIENT_CLINIC_OR_DEPARTMENT_OTHER): Payer: No Typology Code available for payment source | Admitting: *Deleted

## 2021-04-21 ENCOUNTER — Encounter: Payer: Self-pay | Admitting: *Deleted

## 2021-04-21 VITALS — BP 126/73 | HR 93

## 2021-04-21 DIAGNOSIS — O365990 Maternal care for other known or suspected poor fetal growth, unspecified trimester, not applicable or unspecified: Secondary | ICD-10-CM | POA: Diagnosis not present

## 2021-04-21 DIAGNOSIS — Z3A28 28 weeks gestation of pregnancy: Secondary | ICD-10-CM | POA: Insufficient documentation

## 2021-04-21 DIAGNOSIS — O365930 Maternal care for other known or suspected poor fetal growth, third trimester, not applicable or unspecified: Secondary | ICD-10-CM

## 2021-04-21 NOTE — Procedures (Signed)
Ashley Watson 10/08/1990 [redacted]w[redacted]d  Fetus A Non-Stress Test Interpretation for 04/21/21  Indication: IUGR  Fetal Heart Rate A Mode: External Baseline Rate (A): 140 bpm Variability: Moderate Accelerations: 15 x 15 Decelerations: None Multiple birth?: No  Uterine Activity Mode: Palpation, Toco Contraction Frequency (min): None Resting Tone Palpated: Relaxed Resting Time: Adequate  Interpretation (Fetal Testing) Nonstress Test Interpretation: Reactive Comments: Dr. Annamaria Boots reviewed tracing.

## 2021-04-22 ENCOUNTER — Other Ambulatory Visit: Payer: Self-pay | Admitting: *Deleted

## 2021-04-22 DIAGNOSIS — O98519 Other viral diseases complicating pregnancy, unspecified trimester: Secondary | ICD-10-CM

## 2021-04-27 ENCOUNTER — Other Ambulatory Visit: Payer: No Typology Code available for payment source

## 2021-04-27 ENCOUNTER — Ambulatory Visit (INDEPENDENT_AMBULATORY_CARE_PROVIDER_SITE_OTHER): Payer: No Typology Code available for payment source | Admitting: Family Medicine

## 2021-04-27 ENCOUNTER — Other Ambulatory Visit: Payer: Self-pay

## 2021-04-27 VITALS — BP 132/78 | HR 101 | Wt 182.7 lb

## 2021-04-27 DIAGNOSIS — Z3A14 14 weeks gestation of pregnancy: Secondary | ICD-10-CM

## 2021-04-27 DIAGNOSIS — Z23 Encounter for immunization: Secondary | ICD-10-CM | POA: Diagnosis not present

## 2021-04-27 DIAGNOSIS — Z3400 Encounter for supervision of normal first pregnancy, unspecified trimester: Secondary | ICD-10-CM | POA: Diagnosis not present

## 2021-04-27 DIAGNOSIS — O365990 Maternal care for other known or suspected poor fetal growth, unspecified trimester, not applicable or unspecified: Secondary | ICD-10-CM

## 2021-04-27 NOTE — Progress Notes (Signed)
   PRENATAL VISIT NOTE  Subjective:  Ashley Watson is a 30 y.o. G1P0000 at [redacted]w[redacted]d being seen today for ongoing prenatal care.  She is currently monitored for the following issues for this high-risk pregnancy and has Migraine without aura; Irritable bowel syndrome; Low back pain; Scalp psoriasis; Infertility counseling; Nevus; Supervision of normal first pregnancy, antepartum; and Pregnancy affected by fetal growth restriction on their problem list.  Patient reports  pelvic pressure .  Contractions: Not present. Vag. Bleeding: None.  Movement: Present. Denies leaking of fluid.   The following portions of the patient's history were reviewed and updated as appropriate: allergies, current medications, past family history, past medical history, past social history, past surgical history and problem list.   Objective:   Vitals:   04/27/21 0828  BP: 132/78  Pulse: (!) 101  Weight: 182 lb 11.2 oz (82.9 kg)    Fetal Status: Fetal Heart Rate (bpm): 150 Fundal Height: 28 cm Movement: Present     General:  Alert, oriented and cooperative. Patient is in no acute distress.  Skin: Skin is warm and dry. No rash noted.   Cardiovascular: Normal heart rate noted  Respiratory: Normal respiratory effort, no problems with respiration noted  Abdomen: Soft, gravid, appropriate for gestational age.  Pain/Pressure: Absent     Pelvic: Cervical exam deferred        Extremities: Normal range of motion.  Edema: None  Mental Status: Normal mood and affect. Normal behavior. Normal judgment and thought content.   Assessment and Plan:  Pregnancy: G1P0000 at [redacted]w[redacted]d 1. Supervision of normal first pregnancy, antepartum 28 wk labs today - CBC - HIV antibody (with reflex) - RPR - Tdap vaccine greater than or equal to 7yo IM - Flu Vaccine QUAD 25mo+IM (Fluarix, Fluzone & Alfiuria Quad PF)  2. Pregnancy affected by fetal growth restriction In weekly testing with Dopplers per MFM  Preterm labor symptoms and  general obstetric precautions including but not limited to vaginal bleeding, contractions, leaking of fluid and fetal movement were reviewed in detail with the patient. Please refer to After Visit Summary for other counseling recommendations.   Return in 2 weeks (on 05/11/2021) for [facility], needs MD, in person try to coordinate with MFM scans.  Future Appointments  Date Time Provider [place]  04/28/2021  7:15 AM [facility] NURSE [facility]  04/28/2021  7:30 AM [facility] US3 [facility]  04/28/2021  9:45 AM [facility] NST [facility]  05/05/2021  7:30 AM [facility] NURSE [facility]  05/05/2021  7:45 AM [facility] US6 [facility]  05/05/2021  8:45 AM [facility] NST [facility]    Donnamae Jude, MD

## 2021-04-27 NOTE — Patient Instructions (Signed)

## 2021-04-28 ENCOUNTER — Ambulatory Visit (HOSPITAL_BASED_OUTPATIENT_CLINIC_OR_DEPARTMENT_OTHER): Payer: No Typology Code available for payment source | Admitting: *Deleted

## 2021-04-28 ENCOUNTER — Encounter: Payer: Self-pay | Admitting: *Deleted

## 2021-04-28 ENCOUNTER — Encounter: Payer: Self-pay | Admitting: Family Medicine

## 2021-04-28 ENCOUNTER — Ambulatory Visit: Payer: No Typology Code available for payment source | Attending: Obstetrics

## 2021-04-28 ENCOUNTER — Other Ambulatory Visit: Payer: Self-pay | Admitting: *Deleted

## 2021-04-28 ENCOUNTER — Ambulatory Visit: Payer: No Typology Code available for payment source | Admitting: *Deleted

## 2021-04-28 VITALS — BP 128/65 | HR 91

## 2021-04-28 DIAGNOSIS — O365930 Maternal care for other known or suspected poor fetal growth, third trimester, not applicable or unspecified: Secondary | ICD-10-CM | POA: Insufficient documentation

## 2021-04-28 DIAGNOSIS — O99810 Abnormal glucose complicating pregnancy: Secondary | ICD-10-CM

## 2021-04-28 DIAGNOSIS — O98513 Other viral diseases complicating pregnancy, third trimester: Secondary | ICD-10-CM

## 2021-04-28 DIAGNOSIS — O24419 Gestational diabetes mellitus in pregnancy, unspecified control: Secondary | ICD-10-CM | POA: Insufficient documentation

## 2021-04-28 DIAGNOSIS — B343 Parvovirus infection, unspecified: Secondary | ICD-10-CM | POA: Diagnosis present

## 2021-04-28 DIAGNOSIS — O365990 Maternal care for other known or suspected poor fetal growth, unspecified trimester, not applicable or unspecified: Secondary | ICD-10-CM

## 2021-04-28 DIAGNOSIS — Z3A29 29 weeks gestation of pregnancy: Secondary | ICD-10-CM | POA: Diagnosis present

## 2021-04-28 DIAGNOSIS — Z20828 Contact with and (suspected) exposure to other viral communicable diseases: Secondary | ICD-10-CM

## 2021-04-28 LAB — CBC
Hematocrit: 37.5 % (ref 34.0–46.6)
Hemoglobin: 12.4 g/dL (ref 11.1–15.9)
MCH: 29 pg (ref 26.6–33.0)
MCHC: 33.1 g/dL (ref 31.5–35.7)
MCV: 88 fL (ref 79–97)
Platelets: 201 10*3/uL (ref 150–450)
RBC: 4.28 x10E6/uL (ref 3.77–5.28)
RDW: 12.8 % (ref 11.7–15.4)
WBC: 14.9 10*3/uL — ABNORMAL HIGH (ref 3.4–10.8)

## 2021-04-28 LAB — RPR: RPR Ser Ql: NONREACTIVE

## 2021-04-28 LAB — GLUCOSE TOLERANCE, 2 HOURS W/ 1HR
Glucose, 1 hour: 233 mg/dL — ABNORMAL HIGH (ref 65–179)
Glucose, 2 hour: 168 mg/dL — ABNORMAL HIGH (ref 65–152)
Glucose, Fasting: 110 mg/dL — ABNORMAL HIGH (ref 65–91)

## 2021-04-28 LAB — HIV ANTIBODY (ROUTINE TESTING W REFLEX): HIV Screen 4th Generation wRfx: NONREACTIVE

## 2021-04-28 NOTE — Progress Notes (Unsigned)
u

## 2021-04-28 NOTE — Procedures (Signed)
Ashley Watson Dec 21, 1990 [redacted]w[redacted]d  Fetus A Non-Stress Test Interpretation for 04/28/21  Indication: IUGR  Fetal Heart Rate A Mode: External Baseline Rate (A): 145 bpm Variability: Moderate Accelerations: 10 x 10 Decelerations: None Multiple birth?: No  Uterine Activity Mode: Palpation, Toco Contraction Frequency (min): None Resting Tone Palpated: Relaxed Resting Time: Adequate  Interpretation (Fetal Testing) Nonstress Test Interpretation: Reactive Overall Impression: Reassuring for gestational age Comments: Dr. Donalee Citrin reviewed tracing.

## 2021-05-05 ENCOUNTER — Encounter: Payer: Self-pay | Admitting: *Deleted

## 2021-05-05 ENCOUNTER — Ambulatory Visit (HOSPITAL_BASED_OUTPATIENT_CLINIC_OR_DEPARTMENT_OTHER): Payer: No Typology Code available for payment source | Admitting: *Deleted

## 2021-05-05 ENCOUNTER — Ambulatory Visit: Payer: No Typology Code available for payment source | Attending: Obstetrics

## 2021-05-05 ENCOUNTER — Other Ambulatory Visit: Payer: Self-pay

## 2021-05-05 ENCOUNTER — Ambulatory Visit: Payer: No Typology Code available for payment source | Admitting: *Deleted

## 2021-05-05 VITALS — BP 130/68 | HR 85

## 2021-05-05 DIAGNOSIS — B343 Parvovirus infection, unspecified: Secondary | ICD-10-CM | POA: Diagnosis not present

## 2021-05-05 DIAGNOSIS — Z3A30 30 weeks gestation of pregnancy: Secondary | ICD-10-CM | POA: Insufficient documentation

## 2021-05-05 DIAGNOSIS — O98513 Other viral diseases complicating pregnancy, third trimester: Secondary | ICD-10-CM | POA: Insufficient documentation

## 2021-05-05 DIAGNOSIS — O365930 Maternal care for other known or suspected poor fetal growth, third trimester, not applicable or unspecified: Secondary | ICD-10-CM | POA: Insufficient documentation

## 2021-05-05 DIAGNOSIS — O365990 Maternal care for other known or suspected poor fetal growth, unspecified trimester, not applicable or unspecified: Secondary | ICD-10-CM | POA: Insufficient documentation

## 2021-05-05 DIAGNOSIS — O98519 Other viral diseases complicating pregnancy, unspecified trimester: Secondary | ICD-10-CM | POA: Diagnosis present

## 2021-05-05 NOTE — Procedures (Signed)
ADYLYNN HERTENSTEIN July 04, 1991 [redacted]w[redacted]d  Fetus A Non-Stress Test Interpretation for 05/05/21  Indication: IUGR  Fetal Heart Rate A Mode: External Baseline Rate (A): 135 bpm Variability: Moderate Accelerations: 15 x 15 Decelerations: None Multiple birth?: No  Uterine Activity Mode: Palpation, Toco Contraction Frequency (min): None Resting Tone Palpated: Relaxed Resting Time: Adequate  Interpretation (Fetal Testing) Nonstress Test Interpretation: Reactive Comments: Dr. Donalee Citrin reviewed tracing.

## 2021-05-10 ENCOUNTER — Other Ambulatory Visit: Payer: No Typology Code available for payment source | Admitting: Registered"

## 2021-05-10 ENCOUNTER — Telehealth: Payer: Self-pay | Admitting: Registered"

## 2021-05-10 NOTE — Telephone Encounter (Signed)
No show for GDM initial visit. LVM asking to reschedule

## 2021-05-12 ENCOUNTER — Other Ambulatory Visit: Payer: Self-pay

## 2021-05-12 ENCOUNTER — Encounter: Payer: No Typology Code available for payment source | Attending: Family Medicine | Admitting: Registered"

## 2021-05-12 ENCOUNTER — Ambulatory Visit: Payer: No Typology Code available for payment source | Admitting: Registered"

## 2021-05-12 DIAGNOSIS — Z3A00 Weeks of gestation of pregnancy not specified: Secondary | ICD-10-CM | POA: Diagnosis not present

## 2021-05-12 DIAGNOSIS — O24419 Gestational diabetes mellitus in pregnancy, unspecified control: Secondary | ICD-10-CM

## 2021-05-12 DIAGNOSIS — O99810 Abnormal glucose complicating pregnancy: Secondary | ICD-10-CM | POA: Diagnosis present

## 2021-05-12 MED ORDER — ONETOUCH VERIO VI STRP
ORAL_STRIP | 12 refills | Status: DC
Start: 2021-05-12 — End: 2021-06-26

## 2021-05-12 MED ORDER — ONETOUCH ULTRASOFT LANCETS MISC: 12 refills | Status: DC

## 2021-05-12 NOTE — Progress Notes (Signed)
Patient was seen for Gestational Diabetes self-management on 05/12/21  Start time 1030 and End time 1120  Estimated due date: 07/12/21; [redacted]w[redacted]d  Clinical: Medications: prenatal Medical History: IBS Labs: OGTT [id number], A1c n/a%   Dietary and Lifestyle History: Patient states due to lower abdominal pressure and back pain due to the pregnancy she doesn't get much exercise, mostly just stretching.  Pt states she does not eat out at fast food often. Pt states one meal recently at [employer] was chicken sandwich with french fries and onion rings.  Physical Activity: ADLs Stress:not assessed Sleep:not assessed  24 hr Recall: First Meal: eggs, bacon, 2 toast, water Snack: cereal (cookie crisps), whole milk just on cereal Second meal: chicken noodle soup, apple, peanut butter Snack: Chef boyarde Third meal: Beef with potato and onion Snack: chips, french onion dip Beverages: water, sugar free diet Dr Malachi Bonds 2x/week  NUTRITION INTERVENTION  Nutrition education (E-1) on the following topics:   Initial Follow-up  [x]  []  Definition of Gestational Diabetes [x]  []  Why dietary management is important in controlling blood glucose [x]  []  Effects each nutrient has on blood glucose levels []  []  Simple carbohydrates vs complex carbohydrates []  []  Fluid intake [x]  []  Creating a balanced meal plan [x]  []  Carbohydrate counting  [x]  []  When to check blood glucose levels [x]  []  Proper blood glucose monitoring techniques [x]  []  Effect of stress and stress reduction techniques  [x]  []  Exercise effect on blood glucose levels, appropriate exercise during pregnancy [x]  []  Importance of limiting caffeine and abstaining from alcohol and smoking [x]  []  Medications used for blood sugar control during pregnancy [x]  []  Hypoglycemia and rule of 15 [x]  []  Postpartum self care  Blood glucose monitor given: OneTouch Verio Reflect Lot# [id number] X Exp: 05/30/2021 Blood Glucose: 145 mg/dL  Patient instructed  to monitor glucose levels: FBS: 60 - ? 95 mg/dL (some clinics use 90 for cutoff) 1 hour: ? 140 mg/dL 2 hour: ? 120 mg/dL  Patient received handouts: Nutrition Diabetes and Pregnancy Carbohydrate Counting List  Patient will be seen for follow-up as needed.

## 2021-05-13 ENCOUNTER — Other Ambulatory Visit: Payer: Self-pay | Admitting: *Deleted

## 2021-05-13 ENCOUNTER — Ambulatory Visit: Payer: No Typology Code available for payment source | Admitting: *Deleted

## 2021-05-13 ENCOUNTER — Encounter: Payer: Self-pay | Admitting: *Deleted

## 2021-05-13 ENCOUNTER — Ambulatory Visit: Payer: No Typology Code available for payment source | Attending: Obstetrics and Gynecology

## 2021-05-13 VITALS — BP 143/69 | HR 97

## 2021-05-13 DIAGNOSIS — O365990 Maternal care for other known or suspected poor fetal growth, unspecified trimester, not applicable or unspecified: Secondary | ICD-10-CM | POA: Diagnosis present

## 2021-05-13 DIAGNOSIS — Z3A31 31 weeks gestation of pregnancy: Secondary | ICD-10-CM

## 2021-05-13 DIAGNOSIS — Z20828 Contact with and (suspected) exposure to other viral communicable diseases: Secondary | ICD-10-CM | POA: Insufficient documentation

## 2021-05-13 DIAGNOSIS — O98513 Other viral diseases complicating pregnancy, third trimester: Secondary | ICD-10-CM | POA: Diagnosis not present

## 2021-05-13 DIAGNOSIS — O24419 Gestational diabetes mellitus in pregnancy, unspecified control: Secondary | ICD-10-CM | POA: Diagnosis not present

## 2021-05-13 DIAGNOSIS — O365930 Maternal care for other known or suspected poor fetal growth, third trimester, not applicable or unspecified: Secondary | ICD-10-CM | POA: Diagnosis not present

## 2021-05-18 ENCOUNTER — Encounter: Payer: No Typology Code available for payment source | Admitting: Family Medicine

## 2021-05-19 ENCOUNTER — Other Ambulatory Visit: Payer: Self-pay | Admitting: *Deleted

## 2021-05-19 ENCOUNTER — Encounter: Payer: Self-pay | Admitting: *Deleted

## 2021-05-19 ENCOUNTER — Other Ambulatory Visit: Payer: Self-pay

## 2021-05-19 ENCOUNTER — Encounter: Payer: No Typology Code available for payment source | Admitting: Family Medicine

## 2021-05-19 ENCOUNTER — Ambulatory Visit: Payer: No Typology Code available for payment source | Attending: Obstetrics and Gynecology

## 2021-05-19 ENCOUNTER — Ambulatory Visit: Payer: No Typology Code available for payment source | Admitting: *Deleted

## 2021-05-19 VITALS — BP 131/76 | HR 91

## 2021-05-19 DIAGNOSIS — Z20828 Contact with and (suspected) exposure to other viral communicable diseases: Secondary | ICD-10-CM | POA: Insufficient documentation

## 2021-05-19 DIAGNOSIS — O365930 Maternal care for other known or suspected poor fetal growth, third trimester, not applicable or unspecified: Secondary | ICD-10-CM | POA: Insufficient documentation

## 2021-05-19 DIAGNOSIS — O365990 Maternal care for other known or suspected poor fetal growth, unspecified trimester, not applicable or unspecified: Secondary | ICD-10-CM | POA: Insufficient documentation

## 2021-05-19 DIAGNOSIS — Z3A32 32 weeks gestation of pregnancy: Secondary | ICD-10-CM

## 2021-05-19 DIAGNOSIS — O24419 Gestational diabetes mellitus in pregnancy, unspecified control: Secondary | ICD-10-CM

## 2021-05-19 DIAGNOSIS — B343 Parvovirus infection, unspecified: Secondary | ICD-10-CM

## 2021-05-19 DIAGNOSIS — O24410 Gestational diabetes mellitus in pregnancy, diet controlled: Secondary | ICD-10-CM

## 2021-05-26 ENCOUNTER — Other Ambulatory Visit: Payer: No Typology Code available for payment source

## 2021-05-27 ENCOUNTER — Ambulatory Visit: Payer: No Typology Code available for payment source | Admitting: *Deleted

## 2021-05-27 ENCOUNTER — Encounter: Payer: No Typology Code available for payment source | Admitting: Family Medicine

## 2021-05-27 ENCOUNTER — Other Ambulatory Visit: Payer: Self-pay

## 2021-05-27 ENCOUNTER — Ambulatory Visit: Payer: No Typology Code available for payment source | Attending: Obstetrics and Gynecology

## 2021-05-27 ENCOUNTER — Encounter: Payer: Self-pay | Admitting: *Deleted

## 2021-05-27 VITALS — BP 140/74 | HR 84

## 2021-05-27 DIAGNOSIS — O365930 Maternal care for other known or suspected poor fetal growth, third trimester, not applicable or unspecified: Secondary | ICD-10-CM

## 2021-05-27 DIAGNOSIS — O24419 Gestational diabetes mellitus in pregnancy, unspecified control: Secondary | ICD-10-CM

## 2021-05-27 DIAGNOSIS — Z3A33 33 weeks gestation of pregnancy: Secondary | ICD-10-CM | POA: Diagnosis not present

## 2021-06-01 ENCOUNTER — Other Ambulatory Visit: Payer: Self-pay

## 2021-06-03 ENCOUNTER — Encounter: Payer: Self-pay | Admitting: Radiology

## 2021-06-03 ENCOUNTER — Other Ambulatory Visit: Payer: Self-pay

## 2021-06-03 ENCOUNTER — Ambulatory Visit: Payer: No Typology Code available for payment source | Attending: Obstetrics and Gynecology

## 2021-06-03 ENCOUNTER — Ambulatory Visit (INDEPENDENT_AMBULATORY_CARE_PROVIDER_SITE_OTHER): Payer: No Typology Code available for payment source | Admitting: Obstetrics & Gynecology

## 2021-06-03 ENCOUNTER — Encounter: Payer: Self-pay | Admitting: *Deleted

## 2021-06-03 ENCOUNTER — Ambulatory Visit: Payer: No Typology Code available for payment source | Admitting: *Deleted

## 2021-06-03 ENCOUNTER — Other Ambulatory Visit: Payer: Self-pay | Admitting: *Deleted

## 2021-06-03 VITALS — Wt 193.0 lb

## 2021-06-03 VITALS — BP 123/80 | HR 82

## 2021-06-03 DIAGNOSIS — O24419 Gestational diabetes mellitus in pregnancy, unspecified control: Secondary | ICD-10-CM

## 2021-06-03 DIAGNOSIS — O365930 Maternal care for other known or suspected poor fetal growth, third trimester, not applicable or unspecified: Secondary | ICD-10-CM | POA: Diagnosis not present

## 2021-06-03 DIAGNOSIS — Z3400 Encounter for supervision of normal first pregnancy, unspecified trimester: Secondary | ICD-10-CM

## 2021-06-03 DIAGNOSIS — Z3A34 34 weeks gestation of pregnancy: Secondary | ICD-10-CM | POA: Diagnosis not present

## 2021-06-03 DIAGNOSIS — O365931 Maternal care for other known or suspected poor fetal growth, third trimester, fetus 1: Secondary | ICD-10-CM

## 2021-06-03 NOTE — Progress Notes (Signed)
   PRENATAL VISIT NOTE  Subjective:  Ashley Watson is a 30 y.o. G1P0000 at [redacted]w[redacted]d being seen today for ongoing prenatal care.  She is currently monitored for the following issues for this high-risk pregnancy and has Migraine without aura; Irritable bowel syndrome; Low back pain; Scalp psoriasis; Infertility counseling; Nevus; Supervision of normal first pregnancy, antepartum; Pregnancy affected by fetal growth restriction; and Gestational diabetes on their problem list.  Patient reports mild crampy pain, no vaginal bleeding, vaginal discharge but no leakage of fluid, feeling regular movement, some Braxton-Hicks contractions, no concerning headaches or vision changes, and some swelling in her feet. She has been writing down her blood sugar measurements at home but forgot to bring her sheet today. She recalls a morning fasting measurement at 111 mg/dl and postprandial values ranging from 109-190s.  The following portions of the patient's history were reviewed and updated as appropriate: allergies, current medications, past family history, past medical history, past social history, past surgical history and problem list.   Objective:   Vitals:   06/03/21 1029  Weight: 193 lb (87.5 kg)    Fetal Status: Fetal Heart Rate (bpm): 139         General:  Alert, oriented and cooperative. Patient is in no acute distress.  Skin: Skin is warm and dry. No rash noted.   Cardiovascular: Normal heart rate noted  Respiratory: Normal respiratory effort, no problems with respiration noted  Abdomen: Soft, gravid, appropriate for gestational age.        Pelvic: Cervical exam deferred        Extremities: Normal range of motion.     Mental Status: Normal mood and affect. Normal behavior. Normal judgment and thought content.   Assessment and Plan:  Pregnancy: G1P0000 at [redacted]w[redacted]d 1. Gestational diabetes mellitus (GDM) in third trimester, gestational diabetes method of control unspecified Patient is monitoring  blood glucose values at home but forgot to bring her sheet in today. Reports elevated fasting (111 mg/dl) and postprandial values (high: 190s mg/dl). Suspect patient will need oral agent to help further control blood glucose levels but will need to see her documented values before initiating pharmacotherapy. Encouraged patient to bring her measurements in with her at next follow up appointment next week.  2. Supervision of normal first pregnancy, antepartum Diet controlled GDM and FGR <3%. Plan for induction at 37 weeks.  Preterm labor symptoms and general obstetric precautions including but not limited to vaginal bleeding, contractions, leaking of fluid and fetal movement were reviewed in detail with the patient. Please refer to After Visit Summary for other counseling recommendations.   Return in about 4 weeks (around 07/01/2021).  Future Appointments  Date Time Provider [place]  06/09/2021  7:15 AM [facility] NURSE [facility]  06/09/2021  7:30 AM [facility] US2 [facility]  06/16/2021  7:30 AM [facility] NURSE [facility]  06/16/2021  7:30 AM [facility] US2 [facility]  06/27/2021  7:30 AM [facility] NURSE [facility]  06/27/2021  7:45 AM [facility] US6 [facility]    Archer Asa, Medical Student Attestation of Attending Supervision of Medical Student: Evaluation and management procedures were performed by the medical student under my supervision and collaboration.  I have reviewed the student's note and chart, and I agree with the management and plan.  Emeterio Reeve, MD, [place] Attending [place], [facility]

## 2021-06-08 ENCOUNTER — Encounter: Payer: Self-pay | Admitting: Family Medicine

## 2021-06-08 ENCOUNTER — Telehealth: Payer: Self-pay | Admitting: *Deleted

## 2021-06-08 NOTE — Telephone Encounter (Signed)
Received voicemail from Amy At Dr. [place] office. States she has dental appointment tomorrow and needs medical clearance for dentist. Needs it faxed to [phone]. I called [place] and she confirms she needs it sent. I requested front office fax letter. Aquarius Latouche,RN

## 2021-06-09 ENCOUNTER — Encounter: Payer: Self-pay | Admitting: *Deleted

## 2021-06-09 ENCOUNTER — Ambulatory Visit: Payer: No Typology Code available for payment source | Admitting: *Deleted

## 2021-06-09 ENCOUNTER — Ambulatory Visit: Payer: No Typology Code available for payment source | Attending: Obstetrics and Gynecology

## 2021-06-09 ENCOUNTER — Other Ambulatory Visit: Payer: Self-pay

## 2021-06-09 VITALS — BP 132/93 | HR 72

## 2021-06-09 DIAGNOSIS — O98513 Other viral diseases complicating pregnancy, third trimester: Secondary | ICD-10-CM

## 2021-06-09 DIAGNOSIS — O24410 Gestational diabetes mellitus in pregnancy, diet controlled: Secondary | ICD-10-CM | POA: Diagnosis present

## 2021-06-09 DIAGNOSIS — Z3A35 35 weeks gestation of pregnancy: Secondary | ICD-10-CM

## 2021-06-09 DIAGNOSIS — B343 Parvovirus infection, unspecified: Secondary | ICD-10-CM | POA: Diagnosis present

## 2021-06-09 DIAGNOSIS — O365930 Maternal care for other known or suspected poor fetal growth, third trimester, not applicable or unspecified: Secondary | ICD-10-CM | POA: Diagnosis present

## 2021-06-09 DIAGNOSIS — O98519 Other viral diseases complicating pregnancy, unspecified trimester: Secondary | ICD-10-CM | POA: Insufficient documentation

## 2021-06-09 DIAGNOSIS — O24419 Gestational diabetes mellitus in pregnancy, unspecified control: Secondary | ICD-10-CM | POA: Insufficient documentation

## 2021-06-10 ENCOUNTER — Encounter (HOSPITAL_COMMUNITY): Payer: Self-pay | Admitting: Obstetrics and Gynecology

## 2021-06-10 ENCOUNTER — Inpatient Hospital Stay (HOSPITAL_COMMUNITY)
Admission: AD | Admit: 2021-06-10 | Discharge: 2021-06-10 | Disposition: A | Discharge status: Home / Self Care | Payer: No Typology Code available for payment source | Attending: Obstetrics and Gynecology | Admitting: Obstetrics and Gynecology

## 2021-06-10 ENCOUNTER — Other Ambulatory Visit: Payer: Self-pay

## 2021-06-10 DIAGNOSIS — O365930 Maternal care for other known or suspected poor fetal growth, third trimester, not applicable or unspecified: Secondary | ICD-10-CM | POA: Diagnosis not present

## 2021-06-10 DIAGNOSIS — O24113 Pre-existing diabetes mellitus, type 2, in pregnancy, third trimester: Secondary | ICD-10-CM | POA: Diagnosis not present

## 2021-06-10 DIAGNOSIS — Z3689 Encounter for other specified antenatal screening: Secondary | ICD-10-CM

## 2021-06-10 DIAGNOSIS — O1403 Mild to moderate pre-eclampsia, third trimester: Secondary | ICD-10-CM | POA: Diagnosis present

## 2021-06-10 DIAGNOSIS — Z3A35 35 weeks gestation of pregnancy: Secondary | ICD-10-CM

## 2021-06-10 DIAGNOSIS — O1400 Mild to moderate pre-eclampsia, unspecified trimester: Secondary | ICD-10-CM

## 2021-06-10 HISTORY — DX: Type 2 diabetes mellitus without complications: E11.9

## 2021-06-10 LAB — URINALYSIS, ROUTINE W REFLEX MICROSCOPIC
Bilirubin Urine: NEGATIVE
Glucose, UA: NEGATIVE mg/dL
Hgb urine dipstick: NEGATIVE
Ketones, ur: NEGATIVE mg/dL
Leukocytes,Ua: NEGATIVE
Nitrite: NEGATIVE
Protein, ur: 30 mg/dL — AB
Specific Gravity, Urine: 1.006 (ref 1.005–1.030)
pH: 6 (ref 5.0–8.0)

## 2021-06-10 LAB — COMPREHENSIVE METABOLIC PANEL
ALT: 17 U/L (ref 0–44)
AST: 37 U/L (ref 15–41)
Albumin: 2.8 g/dL — ABNORMAL LOW (ref 3.5–5.0)
Alkaline Phosphatase: 137 U/L — ABNORMAL HIGH (ref 38–126)
Anion gap: 9 (ref 5–15)
BUN: 17 mg/dL (ref 6–20)
CO2: 20 mmol/L — ABNORMAL LOW (ref 22–32)
Calcium: 8.9 mg/dL (ref 8.9–10.3)
Chloride: 104 mmol/L (ref 98–111)
Creatinine, Ser: 0.78 mg/dL (ref 0.44–1.00)
GFR, Estimated: >60 mL/min (ref >60)
Glucose, Bld: 83 mg/dL (ref 70–99)
Potassium: 4.7 mmol/L (ref 3.5–5.1)
Sodium: 133 mmol/L — ABNORMAL LOW (ref 135–145)
Total Bilirubin: 0.4 mg/dL (ref 0.3–1.2)
Total Protein: 6 g/dL — ABNORMAL LOW (ref 6.5–8.1)

## 2021-06-10 LAB — CBC
HCT: 38.5 % (ref 36.0–46.0)
Hemoglobin: 12.5 g/dL (ref 12.0–15.0)
MCH: 28.9 pg (ref 26.0–34.0)
MCHC: 32.5 g/dL (ref 30.0–36.0)
MCV: 88.9 fL (ref 80.0–100.0)
Platelets: 193 10*3/uL (ref 150–400)
RBC: 4.33 MIL/uL (ref 3.87–5.11)
RDW: 14.1 % (ref 11.5–15.5)
WBC: 16 10*3/uL — ABNORMAL HIGH (ref 4.0–10.5)
nRBC: 0 % (ref 0.0–0.2)

## 2021-06-10 LAB — PROTEIN / CREATININE RATIO, URINE
Creatinine, Urine: 31.75 mg/dL
Protein Creatinine Ratio: 1.1 mg/mg{Cre} — ABNORMAL HIGH (ref 0.00–0.15)
Total Protein, Urine: 35 mg/dL

## 2021-06-10 MED ORDER — ACETAMINOPHEN 325 MG PO TABS
650.0000 mg | ORAL_TABLET | Freq: Once | ORAL | Status: AC
  Administered 2021-06-10: 650 mg via ORAL
  Filled 2021-06-10: qty 2

## 2021-06-10 NOTE — MAU Provider Note (Signed)
History     CSN: [id number]  Arrival date and time: 06/10/21 1554   Event Date/Time   First Provider Initiated Contact with Patient 06/10/21 1737      Chief Complaint  Patient presents with   Hypertension   HPI Ashley Watson is a 30 y.o. G1P0000 at [redacted]w[redacted]d who presents to MAU for evaluation Preeclampsia evaluation. Patient was in MFM 06/09/2021 with BP readings of 146/86 and 132/93. She also has BP of 140/74 on 05/27/21. On arrival to MAU she endorses mild headache in the setting of hx of migraines. Pain is located near the top of her forehead and near her occiput. Pain score is 2/10. She has not taken medication. She denies visual disturbances, RUQ/epigastric pain, new onset swelling or weight gain. She denies contractions, vaginal bleeding, leaking of fluid, decreased fetal movement, fever, falls, or recent illness.   Patient receives care with [facility]. Her pregnancy is c/b IUGR and A1GDM (well controlled).  OB History     Gravida  1   Para  0   Term  0   Preterm  0   AB  0   Living  0      SAB  0   IAB  0   Ectopic  0   Multiple  0   Live Births              Past Medical History:  Diagnosis Date   Dysmenorrhea    Migraines    Type 2 diabetes mellitus ([place])     Past Surgical History:  Procedure Laterality Date   LAPAROSCOPIC UNILATERAL SALPINGECTOMY Left 07/08/2020   Procedure: LAPARSCOPIC left salpingectomy, chromotubation,  lysis of adhesions, and electrosurgical excision and vaporization of ovarian and peritoneal endometriotic lesions;  Surgeon: Governor Specking, MD;  Location: [facility];  Service: Gynecology;  Laterality: Left;   NO PAST SURGERIES     TOOTH EXTRACTION  2016    Family History  Problem Relation Age of Onset   Diabetes Father    Hypertension Father    Gout Father     Social History   Tobacco Use   Smoking status: Never   Smokeless tobacco: Never  Vaping Use   Vaping Use: Never used  Substance Use  Topics   Alcohol use: Not Currently    Alcohol/week: 1.0 standard drink    Types: 1 Glasses of wine per week    Comment: occ x 1 week wine   Drug use: Not Currently    Frequency: 7.0 times per week    Types: Marijuana    Comment: none since preg    Allergies:  Allergies  Allergen Reactions   Latex Rash    Medications Prior to Admission  Medication Sig Dispense Refill Last Dose   glucose blood (ONETOUCH VERIO) test strip Use as instructed 100 each 12    Lancets (ONETOUCH ULTRASOFT) lancets Use as instructed 100 each 12    Prenatal Vit-Fe Fumarate-FA (PRENATAL VITAMIN PO) Take by mouth.       Review of Systems  Eyes:  Negative for photophobia and visual disturbance.  Neurological:  Positive for headaches.  All other systems reviewed and are negative. Physical Exam   Blood pressure 125/71, pulse 67, temperature 98.4 F (36.9 C), temperature source Oral, resp. rate 16, height 5\' 5"  (1.651 m), weight 87.5 kg, last menstrual period 10/05/2020, SpO2 97 %.  Physical Exam Vitals and nursing note reviewed. Exam conducted with a chaperone present.  Constitutional:  Appearance: Normal appearance. She is not ill-appearing.  Cardiovascular:     Rate and Rhythm: Normal rate and regular rhythm.     Pulses: Normal pulses.     Heart sounds: Normal heart sounds.  Pulmonary:     Effort: Pulmonary effort is normal.     Breath sounds: Normal breath sounds.  Abdominal:     Comments: Gravid  Skin:    Capillary Refill: Capillary refill takes less than 2 seconds.  Neurological:     Mental Status: She is alert and oriented to person, place, and time.  Psychiatric:        Mood and Affect: Mood normal.        Behavior: Behavior normal.        Thought Content: Thought content normal.        Judgment: Judgment normal.    MAU Course  Procedures  --Reactive tracing: baseline 125, mod var, + accels, no decels --Toco: quiet --P:Cr 1.10. Headache resolved with Tylenol. No severe range  blood pressures. No severe symptoms. --Patient meets criteria for diagnosis of Mild Preeclampsia. Discussed with Dr. [name]. Appropriate for discharge home with strict return precautions  Orders Placed This Encounter  Procedures   Protein / creatinine ratio, urine   Urinalysis, Routine w reflex microscopic Urine, Clean Catch   CBC   Comprehensive metabolic panel   Discharge patient   Patient Vitals for the past 24 hrs:  BP Temp Temp src Pulse Resp SpO2 Height Weight  06/10/21 1730 125/71 -- -- 67 -- -- -- --  06/10/21 1715 127/74 -- -- 73 -- -- -- --  06/10/21 1701 128/72 -- -- 80 -- -- -- --  06/10/21 1646 (!) 126/93 -- -- 73 -- -- -- --  06/10/21 1620 (!) 151/93 98.4 F (36.9 C) Oral 74 16 97 % 5\' 5"  (1.651 m) 87.5 kg   Results for orders placed or performed during the hospital encounter of 06/10/21 (from the past 24 hour(s))  Protein / creatinine ratio, urine     Status: Abnormal   Collection Time: 06/10/21  4:03 PM  Result Value Ref Range   Creatinine, Urine 31.75 mg/dL   Total Protein, Urine 35 mg/dL   Protein Creatinine Ratio 1.10 (H) 0.00 - 0.15 mg/mg[Cre]  Urinalysis, Routine w reflex microscopic     Status: Abnormal   Collection Time: 06/10/21  4:25 PM  Result Value Ref Range   Color, Urine YELLOW YELLOW   APPearance HAZY (A) CLEAR   Specific Gravity, Urine 1.006 1.005 - 1.030   pH 6.0 5.0 - 8.0   Glucose, UA NEGATIVE NEGATIVE mg/dL   Hgb urine dipstick NEGATIVE NEGATIVE   Bilirubin Urine NEGATIVE NEGATIVE   Ketones, ur NEGATIVE NEGATIVE mg/dL   Protein, ur 30 (A) NEGATIVE mg/dL   Nitrite NEGATIVE NEGATIVE   Leukocytes,Ua NEGATIVE NEGATIVE   RBC / HPF 0-5 0 - 5 RBC/hpf   WBC, UA 6-10 0 - 5 WBC/hpf   Bacteria, UA MANY (A) NONE SEEN   Squamous Epithelial / LPF 0-5 0 - 5  CBC     Status: Abnormal   Collection Time: 06/10/21  4:40 PM  Result Value Ref Range   WBC 16.0 (H) 4.0 - 10.5 K/uL   RBC 4.33 3.87 - 5.11 MIL/uL   Hemoglobin 12.5 12.0 - 15.0 g/dL   HCT  38.5 36.0 - 46.0 %   MCV 88.9 80.0 - 100.0 fL   MCH 28.9 26.0 - 34.0 pg   MCHC 32.5 30.0 - 36.0 g/dL  RDW 14.1 11.5 - 15.5 %   Platelets 193 150 - 400 K/uL   nRBC 0.0 0.0 - 0.2 %  Comprehensive metabolic panel     Status: Abnormal   Collection Time: 06/10/21  4:40 PM  Result Value Ref Range   Sodium 133 (L) 135 - 145 mmol/L   Potassium 4.7 3.5 - 5.1 mmol/L   Chloride 104 98 - 111 mmol/L   CO2 20 (L) 22 - 32 mmol/L   Glucose, Bld 83 70 - 99 mg/dL   BUN 17 6 - 20 mg/dL   Creatinine, Ser 0.78 0.44 - 1.00 mg/dL   Calcium 8.9 8.9 - 10.3 mg/dL   Total Protein 6.0 (L) 6.5 - 8.1 g/dL   Albumin 2.8 (L) 3.5 - 5.0 g/dL   AST 37 15 - 41 U/L   ALT 17 0 - 44 U/L   Alkaline Phosphatase 137 (H) 38 - 126 U/L   Total Bilirubin 0.4 0.3 - 1.2 mg/dL   GFR, Estimated >60 >60 mL/min   Anion gap 9 5 - 15   Meds ordered this encounter  Medications   acetaminophen (TYLENOL) tablet 650 mg   Assessment and Plan  --30 y.o. G1P0000 at [redacted]w[redacted]d  --Reactive tracing --Mild Preeclampsia --Discharge home in stable condition with PEC precautions  F/U: --Patient has appointment in office Monday 06/13/21  Mallie Snooks, [place], MSN, CNM Certified Nurse Midwife, [place] for [employer], [place]

## 2021-06-10 NOTE — MAU Note (Signed)
Ashley Watson is a 30 y.o. at [redacted]w[redacted]d here in MAU reporting: was at the office yesterday and had some elevated BP, was told to check it daily. Today it was 129/94. Also having some cramping and a headache.   Onset of complaint: today  Pain score: 2/10  Vitals:   06/10/21 1620  BP: (!) 151/93  Pulse: 74  Resp: 16  Temp: 98.4 F (36.9 C)  SpO2: 97%     FHT: +FM , EFM applied in room  Lab orders placed from triage: UA

## 2021-06-11 ENCOUNTER — Telehealth: Payer: Self-pay | Admitting: Obstetrics and Gynecology

## 2021-06-11 NOTE — Telephone Encounter (Signed)
I was called by [employer] with a critical blood pressure.   Ms. Lafon is a 1/0 at [redacted]w[redacted]d who has a known diagnosis of preE without severe features. Her BP was 134/103 and recheck was 15 minutes later and it was 132/91. She denies HA/BV/RUQ pain.   She has an appointment with me on Monday. We discussed making a plan for IOL at 37w at that time.   We discussed blood pressures that would prompt her to come in directly (160s/110s). She also knows to call with any symptoms.   Radene Gunning, MD

## 2021-06-13 ENCOUNTER — Other Ambulatory Visit (HOSPITAL_COMMUNITY)
Admission: RE | Admit: 2021-06-13 | Discharge: 2021-06-13 | Disposition: A | Discharge status: Home / Self Care | Payer: No Typology Code available for payment source | Source: Ambulatory Visit | Attending: Obstetrics and Gynecology | Admitting: Obstetrics and Gynecology

## 2021-06-13 ENCOUNTER — Other Ambulatory Visit: Payer: Self-pay

## 2021-06-13 ENCOUNTER — Ambulatory Visit (INDEPENDENT_AMBULATORY_CARE_PROVIDER_SITE_OTHER): Payer: No Typology Code available for payment source | Admitting: Obstetrics and Gynecology

## 2021-06-13 VITALS — BP 148/100 | HR 76 | Wt 192.1 lb

## 2021-06-13 DIAGNOSIS — O24419 Gestational diabetes mellitus in pregnancy, unspecified control: Secondary | ICD-10-CM

## 2021-06-13 DIAGNOSIS — Z3400 Encounter for supervision of normal first pregnancy, unspecified trimester: Secondary | ICD-10-CM | POA: Diagnosis not present

## 2021-06-13 DIAGNOSIS — O26893 Other specified pregnancy related conditions, third trimester: Secondary | ICD-10-CM

## 2021-06-13 DIAGNOSIS — O1403 Mild to moderate pre-eclampsia, third trimester: Secondary | ICD-10-CM

## 2021-06-13 DIAGNOSIS — R519 Headache, unspecified: Secondary | ICD-10-CM

## 2021-06-13 DIAGNOSIS — O365990 Maternal care for other known or suspected poor fetal growth, unspecified trimester, not applicable or unspecified: Secondary | ICD-10-CM

## 2021-06-13 LAB — OB RESULTS CONSOLE GC/CHLAMYDIA: Gonorrhea: NEGATIVE

## 2021-06-13 MED ORDER — ACETAMINOPHEN 500 MG PO TABS
1000.0000 mg | ORAL_TABLET | Freq: Once | ORAL | Status: AC
Start: 2021-06-13 — End: 2021-06-13
  Administered 2021-06-13: 1000 mg via ORAL

## 2021-06-13 NOTE — Progress Notes (Signed)
   PRENATAL VISIT NOTE  Subjective:  Ashley Watson is a 30 y.o. G1P0000 at [redacted]w[redacted]d being seen today for ongoing prenatal care.  She is currently monitored for the following issues for this high-risk pregnancy and has Migraine without aura; Irritable bowel syndrome; Low back pain; Scalp psoriasis; Infertility counseling; Nevus; Supervision of normal first pregnancy, antepartum; Pregnancy affected by fetal growth restriction; Gestational diabetes; and Mild preeclampsia on their problem list.  Patient reports headache.  Contractions: Irritability. Vag. Bleeding: None.  Movement: Present. Denies leaking of fluid.   The following portions of the patient's history were reviewed and updated as appropriate: allergies, current medications, past family history, past medical history, past social history, past surgical history and problem list.   Objective:   Vitals:   06/13/21 1633 06/13/21 1643  BP: (!) 158/94 (!) 148/100  Pulse: 73 76  Weight: 192 lb 1.6 oz (87.1 kg)     Fetal Status: Fetal Heart Rate (bpm): 152 Fundal Height: 36 cm Movement: Present  Presentation: Vertex  General:  Alert, oriented and cooperative. Patient is in no acute distress.  Skin: Skin is warm and dry. No rash noted.   Cardiovascular: Normal heart rate noted  Respiratory: Normal respiratory effort, no problems with respiration noted  Abdomen: Soft, gravid, appropriate for gestational age.  Pain/Pressure: Present     Pelvic: Cervical exam performed in the presence of a chaperone Dilation: 1 Effacement (%): 50 Station: -3  Extremities: Normal range of motion.  Edema: None  Mental Status: Normal mood and affect. Normal behavior. Normal judgment and thought content.   Assessment and Plan:  Pregnancy: G1P0000 at [redacted]w[redacted]d 1. Gestational diabetes mellitus (GDM) in third trimester, gestational diabetes method of control unspecified - CBGs well controlled with diet  2. Supervision of normal first pregnancy, antepartum - She  plans condoms for birth control  - s/p flu shot - Culture, beta strep (group b only) - GC/Chlamydia probe amp ([place])not at [facility]  3. Pregnancy affected by fetal growth restriction - Timing of delivery is 37w. She is getting BPP and dopplers weekly. Suspect due to parvo as titers were positive for IgG and IgM - Dopplers normal - Next scan is 11/17  4. Headache in pregnancy, antepartum, third trimester - She rates it a 2/10 and feels like it is her sinuses. She has not taken anything for it. She will take the tylenol in the office today and if it doesn't COMPLETELY resolve, she has been instructed to come to the MAU - acetaminophen (TYLENOL) tablet 1,000 mg  5. Preeclampsia w/o severe features - Discussed IOL at 37w - was able to schedule for 11/23. 11/22 was full. If deliveries occur will move up.   Preterm labor symptoms and general obstetric precautions including but not limited to vaginal bleeding, contractions, leaking of fluid and fetal movement were reviewed in detail with the patient. Please refer to After Visit Summary for other counseling recommendations.   Return in about 1 week (around 06/20/2021).  Future Appointments  Date Time Provider [place]  06/16/2021  7:30 AM [facility] NURSE [facility]  06/16/2021  7:30 AM [facility] US2 [facility]  06/22/2021  7:00 AM [facility] SCHED ROOM [facility] None  06/22/2021  3:15 PM Genia Del, MD [facility]    Radene Gunning, MD

## 2021-06-14 ENCOUNTER — Other Ambulatory Visit: Payer: Self-pay | Admitting: Advanced Practice Midwife

## 2021-06-14 LAB — GC/CHLAMYDIA PROBE AMP (~~LOC~~) NOT AT ARMC
Chlamydia: NEGATIVE
Comment: NEGATIVE
Comment: NORMAL
Neisseria Gonorrhea: NEGATIVE

## 2021-06-16 ENCOUNTER — Ambulatory Visit: Payer: No Typology Code available for payment source | Attending: Obstetrics and Gynecology

## 2021-06-16 ENCOUNTER — Ambulatory Visit: Payer: No Typology Code available for payment source | Admitting: *Deleted

## 2021-06-16 ENCOUNTER — Other Ambulatory Visit: Payer: Self-pay

## 2021-06-16 ENCOUNTER — Other Ambulatory Visit: Payer: Self-pay | Admitting: Advanced Practice Midwife

## 2021-06-16 VITALS — BP 135/84 | HR 86

## 2021-06-16 DIAGNOSIS — B343 Parvovirus infection, unspecified: Secondary | ICD-10-CM | POA: Insufficient documentation

## 2021-06-16 DIAGNOSIS — O365931 Maternal care for other known or suspected poor fetal growth, third trimester, fetus 1: Secondary | ICD-10-CM | POA: Insufficient documentation

## 2021-06-16 DIAGNOSIS — Z3A36 36 weeks gestation of pregnancy: Secondary | ICD-10-CM

## 2021-06-16 DIAGNOSIS — O24419 Gestational diabetes mellitus in pregnancy, unspecified control: Secondary | ICD-10-CM

## 2021-06-16 DIAGNOSIS — O24410 Gestational diabetes mellitus in pregnancy, diet controlled: Secondary | ICD-10-CM | POA: Diagnosis present

## 2021-06-16 DIAGNOSIS — O98519 Other viral diseases complicating pregnancy, unspecified trimester: Secondary | ICD-10-CM | POA: Diagnosis present

## 2021-06-16 DIAGNOSIS — O365930 Maternal care for other known or suspected poor fetal growth, third trimester, not applicable or unspecified: Secondary | ICD-10-CM | POA: Insufficient documentation

## 2021-06-16 DIAGNOSIS — O98513 Other viral diseases complicating pregnancy, third trimester: Secondary | ICD-10-CM | POA: Diagnosis not present

## 2021-06-16 LAB — CULTURE, BETA STREP (GROUP B ONLY): Strep Gp B Culture: POSITIVE — AB

## 2021-06-19 ENCOUNTER — Other Ambulatory Visit: Payer: Self-pay | Admitting: Advanced Practice Midwife

## 2021-06-22 ENCOUNTER — Inpatient Hospital Stay (HOSPITAL_COMMUNITY)
Admission: AD | Admit: 2021-06-22 | Discharge: 2021-06-26 | DRG: 807 | Disposition: A | Discharge status: Home / Self Care | Payer: No Typology Code available for payment source | Attending: Obstetrics and Gynecology | Admitting: Obstetrics and Gynecology

## 2021-06-22 ENCOUNTER — Inpatient Hospital Stay (HOSPITAL_COMMUNITY): Payer: No Typology Code available for payment source

## 2021-06-22 ENCOUNTER — Encounter (HOSPITAL_COMMUNITY): Payer: Self-pay | Admitting: Obstetrics & Gynecology

## 2021-06-22 ENCOUNTER — Encounter: Payer: No Typology Code available for payment source | Admitting: Family Medicine

## 2021-06-22 DIAGNOSIS — O24419 Gestational diabetes mellitus in pregnancy, unspecified control: Secondary | ICD-10-CM

## 2021-06-22 DIAGNOSIS — Z3A37 37 weeks gestation of pregnancy: Secondary | ICD-10-CM

## 2021-06-22 DIAGNOSIS — O1404 Mild to moderate pre-eclampsia, complicating childbirth: Secondary | ICD-10-CM | POA: Diagnosis present

## 2021-06-22 DIAGNOSIS — O99824 Streptococcus B carrier state complicating childbirth: Secondary | ICD-10-CM | POA: Diagnosis present

## 2021-06-22 DIAGNOSIS — Z8759 Personal history of other complications of pregnancy, childbirth and the puerperium: Secondary | ICD-10-CM | POA: Diagnosis present

## 2021-06-22 DIAGNOSIS — O1410 Severe pre-eclampsia, unspecified trimester: Secondary | ICD-10-CM

## 2021-06-22 DIAGNOSIS — O1414 Severe pre-eclampsia complicating childbirth: Principal | ICD-10-CM | POA: Diagnosis present

## 2021-06-22 DIAGNOSIS — Z20822 Contact with and (suspected) exposure to covid-19: Secondary | ICD-10-CM | POA: Diagnosis present

## 2021-06-22 DIAGNOSIS — O1400 Mild to moderate pre-eclampsia, unspecified trimester: Secondary | ICD-10-CM | POA: Diagnosis present

## 2021-06-22 DIAGNOSIS — O365930 Maternal care for other known or suspected poor fetal growth, third trimester, not applicable or unspecified: Secondary | ICD-10-CM | POA: Diagnosis present

## 2021-06-22 DIAGNOSIS — O24420 Gestational diabetes mellitus in childbirth, diet controlled: Secondary | ICD-10-CM | POA: Diagnosis present

## 2021-06-22 HISTORY — DX: Personal history of other complications of pregnancy, childbirth and the puerperium: Z87.59

## 2021-06-22 LAB — GLUCOSE, CAPILLARY: Glucose-Capillary: 153 mg/dL — ABNORMAL HIGH (ref 70–99)

## 2021-06-22 MED ORDER — TERBUTALINE SULFATE 1 MG/ML IJ SOLN
0.2500 mg | Freq: Once | INTRAMUSCULAR | Status: AC | PRN
  Administered 2021-06-24: 0.25 mg via SUBCUTANEOUS

## 2021-06-22 MED ORDER — FENTANYL CITRATE (PF) 100 MCG/2ML IJ SOLN
100.0000 ug | INTRAMUSCULAR | Status: DC | PRN
  Administered 2021-06-23 (×2): 100 ug via INTRAVENOUS
  Filled 2021-06-22 (×3): qty 2

## 2021-06-22 MED ORDER — LIDOCAINE HCL (PF) 1 % IJ SOLN
30.0000 mL | INTRAMUSCULAR | Status: AC | PRN
  Administered 2021-06-24: 30 mL via SUBCUTANEOUS
  Filled 2021-06-22: qty 30

## 2021-06-22 MED ORDER — SODIUM CHLORIDE 0.9 % IV SOLN
5.0000 10*6.[IU] | Freq: Once | INTRAVENOUS | Status: AC
  Administered 2021-06-23: 5 10*6.[IU] via INTRAVENOUS
  Filled 2021-06-22: qty 5

## 2021-06-22 MED ORDER — SOD CITRATE-CITRIC ACID 500-334 MG/5ML PO SOLN: 30.0000 mL | ORAL | Status: DC | PRN

## 2021-06-22 MED ORDER — MISOPROSTOL 25 MCG QUARTER TABLET
25.0000 ug | ORAL_TABLET | ORAL | Status: DC | PRN
  Administered 2021-06-23: 25 ug via VAGINAL
  Filled 2021-06-22: qty 1

## 2021-06-22 MED ORDER — PENICILLIN G POT IN DEXTROSE 60000 UNIT/ML IV SOLN
3.0000 10*6.[IU] | INTRAVENOUS | Status: DC
  Administered 2021-06-23 – 2021-06-24 (×8): 3 10*6.[IU] via INTRAVENOUS
  Filled 2021-06-22 (×9): qty 50

## 2021-06-22 MED ORDER — ONDANSETRON HCL 4 MG/2ML IJ SOLN: 4.0000 mg | Freq: Four times a day (QID) | INTRAMUSCULAR | Status: DC | PRN

## 2021-06-22 MED ORDER — OXYTOCIN BOLUS FROM INFUSION
333.0000 mL | Freq: Once | INTRAVENOUS | Status: AC
  Administered 2021-06-24: 333 mL via INTRAVENOUS

## 2021-06-22 MED ORDER — LACTATED RINGERS IV SOLN: INTRAVENOUS | Status: DC

## 2021-06-22 MED ORDER — OXYCODONE-ACETAMINOPHEN 5-325 MG PO TABS: 1.0000 | ORAL_TABLET | ORAL | Status: DC | PRN

## 2021-06-22 MED ORDER — ACETAMINOPHEN 325 MG PO TABS
650.0000 mg | ORAL_TABLET | ORAL | Status: DC | PRN
  Administered 2021-06-23 – 2021-06-24 (×4): 650 mg via ORAL
  Filled 2021-06-22 (×4): qty 2

## 2021-06-22 MED ORDER — OXYTOCIN-SODIUM CHLORIDE 30-0.9 UT/500ML-% IV SOLN: 2.5000 [IU]/h | INTRAVENOUS | Status: DC

## 2021-06-22 MED ORDER — LACTATED RINGERS IV SOLN
500.0000 mL | INTRAVENOUS | Status: DC | PRN
  Administered 2021-06-23 – 2021-06-24 (×2): 500 mL via INTRAVENOUS

## 2021-06-22 MED ORDER — OXYCODONE-ACETAMINOPHEN 5-325 MG PO TABS: 2.0000 | ORAL_TABLET | ORAL | Status: DC | PRN

## 2021-06-22 NOTE — H&P (Signed)
OBSTETRIC ADMISSION HISTORY AND PHYSICAL  Ashley Watson is a 30 y.o. female G1P0000 with IUP at [redacted]w[redacted]d by LMP presenting for IOL due to . She reports +FMs, No LOF, no VB, no blurry vision, headaches or peripheral edema, and RUQ pain.  She plans on breast feeding. She request condoms for birth control. She received her prenatal care at  [facility]    Dating: By LMP --->  Estimated Date of Delivery: 07/12/21   Prenatal History/Complications: -Preeclampsia without severe features  Currently asymptomatic  Baseline labs ordered -GDMA1  Per pt controlled with diet -FGR  Last [place] on 06/16/21: vertex/anterior/EFW: 1963g , 4#5oz (<1%)  Past Medical History: Past Medical History:  Diagnosis Date   Dysmenorrhea    Migraines    Past Surgical History: Past Surgical History:  Procedure Laterality Date   LAPAROSCOPIC UNILATERAL SALPINGECTOMY Left 07/08/2020   Procedure: LAPARSCOPIC left salpingectomy, chromotubation,  lysis of adhesions, and electrosurgical excision and vaporization of ovarian and peritoneal endometriotic lesions;  Surgeon: Governor Specking, MD;  Location: [facility];  Service: Gynecology;  Laterality: Left;   NO PAST SURGERIES     TOOTH EXTRACTION  2016    Obstetrical History: OB History     Gravida  1   Para  0   Term  0   Preterm  0   AB  0   Living  0      SAB  0   IAB  0   Ectopic  0   Multiple  0   Live Births              Social History Social History   Socioeconomic History   Marital status: Married    Spouse name: Not on file   Number of children: Not on file   Years of education: Not on file   Highest education level: Not on file  Occupational History   Occupation: customer service  Tobacco Use   Smoking status: Never   Smokeless tobacco: Never  Vaping Use   Vaping Use: Never used  Substance and Sexual Activity   Alcohol use: Not Currently    Alcohol/week: 1.0 standard drink    Types: 1 Glasses of wine  per week    Comment: occ x 1 week wine   Drug use: Not Currently    Frequency: 7.0 times per week    Types: Marijuana    Comment: none since preg   Sexual activity: Yes    Partners: Male    Birth control/protection: None  Other Topics Concern   Not on file  Social History Narrative   Not on file   Social Determinants of Health   Financial Resource Strain: Not on file  Food Insecurity: No Food Insecurity   Worried About Charity fundraiser in the Last Year: Never true   Ran Out of Food in the Last Year: Never true  Transportation Needs: No Transportation Needs   Lack of Transportation (Medical): No   Lack of Transportation (Non-Medical): No  Physical Activity: Not on file  Stress: Not on file  Social Connections: Not on file    Family History: Family History  Problem Relation Age of Onset   Diabetes Father    Hypertension Father    Gout Father     Allergies: Allergies  Allergen Reactions   Latex Rash    Medications Prior to Admission  Medication Sig Dispense Refill Last Dose   glucose blood (ONETOUCH VERIO) test strip Use as instructed 100  each 12    Lancets (ONETOUCH ULTRASOFT) lancets Use as instructed 100 each 12    Prenatal Vit-Fe Fumarate-FA (PRENATAL VITAMIN PO) Take by mouth.        Review of Systems   All systems reviewed and negative except as stated in HPI  Last menstrual period 10/05/2020. General appearance: alert, cooperative, and no distress Lungs: clear to auscultation bilaterally Heart: regular rate and rhythm Abdomen: soft, non-tender; bowel sounds normal Pelvic:  Extremities: Homans sign is negative, no sign of DVT  Presentation: cephalic by US Fetal monitoringBaseline: 150 bpm, Variability: moderate, Accelerations: +accels, and Decelerations: Absent Uterine activityNone     Prenatal labs: ABO, Rh: A/RH(D) POSITIVE/-- (05/17 1211) Antibody: NO ANTIBODIES DETECTED (05/17 1211) Rubella: 6.44 (05/17 1211) RPR: Non Reactive (09/28  0837)  HBsAg: NON-REACTIVE (05/17 1211)  HIV: Non Reactive (09/28 0837)  GBS: Positive/-- (11/14 1658)  1 hr Glucola abnromal   Prenatal Transfer Tool  Maternal Diabetes: Yes:  Diabetes Type:  Diet controlled Genetic Screening: Normal Maternal Ultrasounds/Referrals: IUGR Fetal Ultrasounds or other Referrals:  Referred to Materal Fetal Medicine  Maternal Substance Abuse:  No Significant Maternal Medications:  None Significant Maternal Lab Results: Group B Strep positive  Results for orders placed or performed during the hospital encounter of 06/22/21 (from the past 24 hour(s))  Glucose, capillary   Collection Time: 06/22/21 11:29 PM  Result Value Ref Range   Glucose-Capillary 153 (H) 70 - 99 mg/dL    Patient Active Problem List   Diagnosis Date Noted   Mild preeclampsia 06/10/2021   Gestational diabetes 04/28/2021   Pregnancy affected by fetal growth restriction 02/18/2021   Supervision of normal first pregnancy, antepartum 12/14/2020   Nevus 10/02/2019   Scalp psoriasis 06/30/2015   Irritable bowel syndrome 10/15/2012   Migraine without aura 04/11/2007    Assessment/Plan:  Ashley Watson is a 30 y.o. G1P0000 at [redacted]w[redacted]d here for IOL due to preeclampsia without severe features and IUGR  #Labor:cytotec placed #Pain: IV or epidural upon request #FWB: Cat. I #ID:  PCN in active labor or with placement of balloon #MOF: breastfeeding #MOC:condoms   Ashley Genta, DO  06/22/2021, 11:31 PM

## 2021-06-23 ENCOUNTER — Inpatient Hospital Stay (HOSPITAL_COMMUNITY): Payer: No Typology Code available for payment source | Admitting: Anesthesiology

## 2021-06-23 ENCOUNTER — Encounter (HOSPITAL_COMMUNITY): Payer: Self-pay | Admitting: Obstetrics and Gynecology

## 2021-06-23 LAB — COMPREHENSIVE METABOLIC PANEL
ALT: 19 U/L (ref 0–44)
ALT: 19 U/L (ref 0–44)
AST: 22 U/L (ref 15–41)
AST: 45 U/L — ABNORMAL HIGH (ref 15–41)
Albumin: 2.5 g/dL — ABNORMAL LOW (ref 3.5–5.0)
Albumin: 2.4 g/dL — ABNORMAL LOW (ref 3.5–5.0)
Alkaline Phosphatase: 123 U/L (ref 38–126)
Alkaline Phosphatase: 124 U/L (ref 38–126)
Anion gap: 8 (ref 5–15)
Anion gap: 9 (ref 5–15)
BUN: 19 mg/dL (ref 6–20)
BUN: 14 mg/dL (ref 6–20)
CO2: 21 mmol/L — ABNORMAL LOW (ref 22–32)
CO2: 22 mmol/L (ref 22–32)
Calcium: 8.8 mg/dL — ABNORMAL LOW (ref 8.9–10.3)
Calcium: 9.1 mg/dL (ref 8.9–10.3)
Chloride: 105 mmol/L (ref 98–111)
Chloride: 104 mmol/L (ref 98–111)
Creatinine, Ser: 0.81 mg/dL (ref 0.44–1.00)
Creatinine, Ser: 0.86 mg/dL (ref 0.44–1.00)
GFR, Estimated: >60 mL/min (ref >60)
GFR, Estimated: >60 mL/min (ref >60)
Glucose, Bld: 149 mg/dL — ABNORMAL HIGH (ref 70–99)
Glucose, Bld: 133 mg/dL — ABNORMAL HIGH (ref 70–99)
Potassium: 3.9 mmol/L (ref 3.5–5.1)
Potassium: 4.8 mmol/L (ref 3.5–5.1)
Sodium: 134 mmol/L — ABNORMAL LOW (ref 135–145)
Sodium: 135 mmol/L (ref 135–145)
Total Bilirubin: 0.4 mg/dL (ref 0.3–1.2)
Total Bilirubin: 1.2 mg/dL (ref 0.3–1.2)
Total Protein: 5.7 g/dL — ABNORMAL LOW (ref 6.5–8.1)
Total Protein: 5.5 g/dL — ABNORMAL LOW (ref 6.5–8.1)

## 2021-06-23 LAB — PROTEIN / CREATININE RATIO, URINE
Creatinine, Urine: 39.32 mg/dL
Protein Creatinine Ratio: 3.46 mg/mg{Cre} — ABNORMAL HIGH (ref 0.00–0.15)
Total Protein, Urine: 136 mg/dL

## 2021-06-23 LAB — CBC
HCT: 36.5 % (ref 36.0–46.0)
HCT: 39.1 % (ref 36.0–46.0)
Hemoglobin: 12.1 g/dL (ref 12.0–15.0)
Hemoglobin: 12.8 g/dL (ref 12.0–15.0)
MCH: 29.5 pg (ref 26.0–34.0)
MCH: 29 pg (ref 26.0–34.0)
MCHC: 33.2 g/dL (ref 30.0–36.0)
MCHC: 32.7 g/dL (ref 30.0–36.0)
MCV: 89 fL (ref 80.0–100.0)
MCV: 88.7 fL (ref 80.0–100.0)
Platelets: 166 10*3/uL (ref 150–400)
Platelets: 179 10*3/uL (ref 150–400)
RBC: 4.1 MIL/uL (ref 3.87–5.11)
RBC: 4.41 MIL/uL (ref 3.87–5.11)
RDW: 14.6 % (ref 11.5–15.5)
RDW: 14.5 % (ref 11.5–15.5)
WBC: 13.4 10*3/uL — ABNORMAL HIGH (ref 4.0–10.5)
WBC: 18.9 10*3/uL — ABNORMAL HIGH (ref 4.0–10.5)
nRBC: 0 % (ref 0.0–0.2)
nRBC: 0 % (ref 0.0–0.2)

## 2021-06-23 LAB — TYPE AND SCREEN
ABO/RH(D): A POS
Antibody Screen: NEGATIVE

## 2021-06-23 LAB — GLUCOSE, CAPILLARY
Glucose-Capillary: 108 mg/dL — ABNORMAL HIGH (ref 70–99)
Glucose-Capillary: 124 mg/dL — ABNORMAL HIGH (ref 70–99)
Glucose-Capillary: 85 mg/dL (ref 70–99)
Glucose-Capillary: 110 mg/dL — ABNORMAL HIGH (ref 70–99)
Glucose-Capillary: 99 mg/dL (ref 70–99)
Glucose-Capillary: 148 mg/dL — ABNORMAL HIGH (ref 70–99)
Glucose-Capillary: 164 mg/dL — ABNORMAL HIGH (ref 70–99)

## 2021-06-23 LAB — RESP PANEL BY RT-PCR (FLU A&B, COVID) ARPGX2
Influenza A by PCR: NEGATIVE
Influenza B by PCR: NEGATIVE
SARS Coronavirus 2 by RT PCR: NEGATIVE

## 2021-06-23 LAB — RPR: RPR Ser Ql: NONREACTIVE

## 2021-06-23 MED ORDER — LIDOCAINE HCL (PF) 1 % IJ SOLN
INTRAMUSCULAR | Status: DC | PRN
  Administered 2021-06-23 (×2): 4 mL via EPIDURAL

## 2021-06-23 MED ORDER — LACTATED RINGERS IV SOLN
500.0000 mL | Freq: Once | INTRAVENOUS | Status: AC
  Administered 2021-06-23: 500 mL via INTRAVENOUS

## 2021-06-23 MED ORDER — OXYTOCIN-SODIUM CHLORIDE 30-0.9 UT/500ML-% IV SOLN
1.0000 m[IU]/min | INTRAVENOUS | Status: DC
  Administered 2021-06-23: 2 m[IU]/min via INTRAVENOUS
  Filled 2021-06-23: qty 500

## 2021-06-23 MED ORDER — DIPHENHYDRAMINE HCL 25 MG PO CAPS
25.0000 mg | ORAL_CAPSULE | Freq: Four times a day (QID) | ORAL | Status: DC | PRN
  Administered 2021-06-23: 25 mg via ORAL
  Filled 2021-06-23: qty 1

## 2021-06-23 MED ORDER — MISOPROSTOL 50MCG HALF TABLET
50.0000 ug | ORAL_TABLET | ORAL | Status: DC
  Administered 2021-06-23: 50 ug via BUCCAL
  Filled 2021-06-23: qty 1

## 2021-06-23 MED ORDER — PHENYLEPHRINE 40 MCG/ML (10ML) SYRINGE FOR IV PUSH (FOR BLOOD PRESSURE SUPPORT): 80.0000 ug | PREFILLED_SYRINGE | INTRAVENOUS | Status: DC | PRN

## 2021-06-23 MED ORDER — FENTANYL-BUPIVACAINE-NACL 0.5-0.125-0.9 MG/250ML-% EP SOLN
12.0000 mL/h | EPIDURAL | Status: DC | PRN
  Administered 2021-06-23: 22:00:00 12 mL/h via EPIDURAL
  Filled 2021-06-23: qty 250

## 2021-06-23 MED ORDER — EPHEDRINE 5 MG/ML INJ: 10.0000 mg | INTRAVENOUS | Status: DC | PRN

## 2021-06-23 MED ORDER — LABETALOL HCL 5 MG/ML IV SOLN
20.0000 mg | INTRAVENOUS | Status: DC | PRN
  Administered 2021-06-23: 20:00:00 20 mg via INTRAVENOUS
  Filled 2021-06-23: qty 4

## 2021-06-23 MED ORDER — MAGNESIUM SULFATE 40 GM/1000ML IV SOLN
1.0000 g/h | INTRAVENOUS | Status: DC
  Administered 2021-06-23: 2 g/h via INTRAVENOUS
  Filled 2021-06-23: qty 1000

## 2021-06-23 MED ORDER — LACTATED RINGERS IV SOLN: 500.0000 mL | Freq: Once | INTRAVENOUS | Status: DC

## 2021-06-23 MED ORDER — INSULIN ASPART 100 UNIT/ML IJ SOLN
0.0000 [IU] | INTRAMUSCULAR | Status: DC
  Administered 2021-06-23: 3 [IU] via SUBCUTANEOUS
  Administered 2021-06-24: 2 [IU] via SUBCUTANEOUS

## 2021-06-23 MED ORDER — LABETALOL HCL 5 MG/ML IV SOLN
40.0000 mg | INTRAVENOUS | Status: DC | PRN
  Administered 2021-06-23: 40 mg via INTRAVENOUS
  Filled 2021-06-23: qty 8

## 2021-06-23 MED ORDER — PHENYLEPHRINE 40 MCG/ML (10ML) SYRINGE FOR IV PUSH (FOR BLOOD PRESSURE SUPPORT)
80.0000 ug | PREFILLED_SYRINGE | INTRAVENOUS | Status: DC | PRN
  Filled 2021-06-23: qty 10

## 2021-06-23 MED ORDER — LACTATED RINGERS AMNIOINFUSION
INTRAVENOUS | Status: DC
  Filled 2021-06-23 (×3): qty 1000

## 2021-06-23 MED ORDER — MAGNESIUM SULFATE BOLUS VIA INFUSION
4.0000 g | Freq: Once | INTRAVENOUS | Status: AC
  Administered 2021-06-23: 4 g via INTRAVENOUS
  Filled 2021-06-23: qty 1000

## 2021-06-23 MED ORDER — TERBUTALINE SULFATE 1 MG/ML IJ SOLN
0.2500 mg | Freq: Once | INTRAMUSCULAR | Status: AC | PRN
  Administered 2021-06-24: 0.25 mg via SUBCUTANEOUS
  Filled 2021-06-23: qty 1

## 2021-06-23 MED ORDER — LABETALOL HCL 5 MG/ML IV SOLN: 80.0000 mg | INTRAVENOUS | Status: DC | PRN

## 2021-06-23 MED ORDER — HYDRALAZINE HCL 20 MG/ML IJ SOLN: 10.0000 mg | INTRAMUSCULAR | Status: DC | PRN

## 2021-06-23 MED ORDER — DIPHENHYDRAMINE HCL 50 MG/ML IJ SOLN
12.5000 mg | INTRAMUSCULAR | Status: DC | PRN
  Administered 2021-06-24 (×2): 12.5 mg via INTRAVENOUS
  Filled 2021-06-23: qty 1

## 2021-06-23 NOTE — Progress Notes (Signed)
Ashley Watson is a 30 y.o. G1P0000 at [redacted]w[redacted]d admitted for induction of labor due to preE SF (BP), severe IUGR (<1%ile) and also GDMA1.  Subjective: Reports starting feel contractions more. Felt gush of fluid  Objective: BP (!) 142/80   Pulse 86   Temp 97.8 F (36.6 C) (Axillary)   Resp 15   Ht 5\' 5"  (1.651 m)   Wt 87.6 kg   LMP 10/05/2020   SpO2 98%   BMI 32.14 kg/m  I/O last 3 completed shifts: In: -  Out: 400 [Urine:400] Total I/O In: 9030 [P.O.:75; I.V.:2082; IV Piggyback:200] Out: 450 [Urine:450]  FHT:  FHR: 140 bpm, variability: moderate,  accelerations:  Present,  decelerations:  Present occassional variables UC:   regular, every 1-3 minutes SVE:   Dilation: 5 Effacement (%): 60 Station: -2 Exam by:: Dr. Cy Blamer  Labs: Lab Results  Component Value Date   WBC 13.4 (H) 06/22/2021   HGB 12.1 06/22/2021   HCT 36.5 06/22/2021   MCV 89.0 06/22/2021   PLT 166 06/22/2021    Assessment / Plan: G1P0000 at [redacted]w[redacted]d admitted for induction of labor due to preE SF (BP), severe IUGR (<1%ile) and also GDMA1.  Now SROM at [place], IUPC placed and pitocin started at ~2100 - uptitrate pitocin as appropriate - if continues to have variable decels will do amnioinfusion  Preeclampsia:  on magnesium sulfate, labetalol protocol Fetal Wellbeing:  Category II, variable decels, improve with position change. Will do amnioinfusion if recurs. Moderate variablity in between Pain Control:   plans epidural I/D:   GBS pos>PCN   #A1GDM Q4hr checks. Last check was less than 2 hrs after last meal and was 140s. Will recheck in 1 hr. If above goal will give SSI.  Renard Matter 06/23/2021, 9:31 PM

## 2021-06-23 NOTE — Progress Notes (Signed)
Patient Vitals for the past 4 hrs:  BP Temp Temp src Pulse Resp  06/23/21 0925 138/83 -- -- 81 20  06/23/21 0854 (!) 145/90 -- -- 68 18  06/23/21 0809 (!) 142/87 -- -- 74 20  06/23/21 0750 -- 98.5 F (36.9 C) Oral -- 18  06/23/21 0740 (!) 146/100 -- -- 76 --  06/23/21 0700 -- -- -- -- 18  06/23/21 0637 131/82 98.1 F (36.7 C) -- 72 18   HA resolved w/APAP.  PT cautioned about informing staff if HA returns/worsens.  Cx FT/long/-2.  Foley inserted and inflated w/60 cc H20.  Moderate amount of bleeding w/cx exam, will keep an eye on it.  FHR w/moderate variability, + accels, occ mild variable.  Will hold off on cytotec for a bit, ensure baby is tolerating Foley.

## 2021-06-23 NOTE — Anesthesia Procedure Notes (Signed)
Epidural Patient location during procedure: OB Start time: 06/23/2021 9:57 PM End time: 06/23/2021 10:00 PM  Staffing Anesthesiologist: Brennan Bailey, MD Performed: anesthesiologist   Preanesthetic Checklist Completed: patient identified, IV checked, risks and benefits discussed, monitors and equipment checked, pre-op evaluation and timeout performed  Epidural Patient position: sitting Prep: DuraPrep and site prepped and draped Patient monitoring: continuous pulse ox, blood pressure and heart rate Approach: midline Location: L3-L4 Injection technique: LOR air  Needle:  Needle type: Tuohy  Needle gauge: 17 G Needle length: 9 cm Needle insertion depth: 5 cm Catheter type: closed end flexible Catheter size: 19 Gauge Catheter at skin depth: 10 cm Test dose: negative and Other (1% lidocaine)  Assessment Events: blood not aspirated, injection not painful, no injection resistance, no paresthesia and negative IV test  Additional Notes Patient identified. Risks, benefits, and alternatives discussed with patient including but not limited to bleeding, infection, nerve damage, paralysis, failed block, incomplete pain control, headache, blood pressure changes, nausea, vomiting, reactions to medication, itching, and postpartum back pain. Confirmed with bedside nurse the patient's most recent platelet count. Confirmed with patient that they are not currently taking any anticoagulation, have any bleeding history, or any family history of bleeding disorders. Patient expressed understanding and wished to proceed. All questions were answered. Sterile technique was used throughout the entire procedure. Please see nursing notes for vital signs.   Crisp LOR on first pass. Test dose was given through epidural catheter and negative prior to continuing to dose epidural or start infusion. Warning signs of high block given to the patient including shortness of breath, tingling/numbness in hands, complete  motor block, or any concerning symptoms with instructions to call for help. Patient was given instructions on fall risk and not to get out of bed. All questions and concerns addressed with instructions to call with any issues or inadequate analgesia.  Reason for block:procedure for pain

## 2021-06-23 NOTE — Progress Notes (Addendum)
Patient Vitals for the past 4 hrs:  BP Temp Temp src Pulse Resp  06/23/21 1607 (!) 158/76 -- -- (!) 55 18  06/23/21 1351 (!) 148/82 98 F (36.7 C) Axillary 60 18   Foley still in.  FHR has remained Cat 1, mild ctx, so cytotec 74mcg given bucally at 1600. HA relieved w/APAP. Blood sugars normal

## 2021-06-23 NOTE — Anesthesia Preprocedure Evaluation (Signed)
Anesthesia Evaluation  Patient identified by MRN, date of birth, ID band Patient awake    Reviewed: Allergy & Precautions, Patient's Chart, lab work & pertinent test results  History of Anesthesia Complications Negative for: history of anesthetic complications  Airway Mallampati: II  TM Distance: >3 FB Neck ROM: Full    Dental no notable dental hx.    Pulmonary neg pulmonary ROS,    Pulmonary exam normal        Cardiovascular hypertension (preE), Normal cardiovascular exam     Neuro/Psych  Headaches, negative psych ROS   GI/Hepatic negative GI ROS, Neg liver ROS,   Endo/Other  diabetes, Gestational  Renal/GU negative Renal ROS  negative genitourinary   Musculoskeletal negative musculoskeletal ROS (+)   Abdominal   Peds  Hematology negative hematology ROS (+)   Anesthesia Other Findings Day of surgery medications reviewed with patient.  Reproductive/Obstetrics (+) Pregnancy                             Anesthesia Physical Anesthesia Plan  ASA: 3  Anesthesia Plan: Epidural   Post-op Pain Management:    Induction:   PONV Risk Score and Plan: Treatment may vary due to age or medical condition  Airway Management Planned: Natural Airway  Additional Equipment: Fetal Monitoring  Intra-op Plan:   Post-operative Plan:   Informed Consent: I have reviewed the patients History and Physical, chart, labs and discussed the procedure including the risks, benefits and alternatives for the proposed anesthesia with the patient or authorized representative who has indicated his/her understanding and acceptance.       Plan Discussed with:   Anesthesia Plan Comments:         Anesthesia Quick Evaluation

## 2021-06-23 NOTE — Progress Notes (Signed)
Patient Vitals for the past 4 hrs:  BP Temp Temp src Pulse Resp  06/23/21 1927 (!) 166/95 -- -- 73 --  06/23/21 1918 (!) 187/90 -- -- 74 --  06/23/21 1840 -- -- -- -- 18  06/23/21 1742 (!) 151/86 98.4 F (36.9 C) Oral 65 18  06/23/21 1607 (!) 158/76 -- -- (!) 55 18   Cooks catheter out, cx 5-6/80/-2.  FHR Cat 1.  Will start Mg for preeclampsia w/severe features (BP) and Pitocin. Blood sugars normal.

## 2021-06-24 ENCOUNTER — Other Ambulatory Visit: Payer: Self-pay

## 2021-06-24 DIAGNOSIS — O24420 Gestational diabetes mellitus in childbirth, diet controlled: Secondary | ICD-10-CM | POA: Diagnosis not present

## 2021-06-24 DIAGNOSIS — O99824 Streptococcus B carrier state complicating childbirth: Secondary | ICD-10-CM | POA: Diagnosis not present

## 2021-06-24 DIAGNOSIS — O1414 Severe pre-eclampsia complicating childbirth: Secondary | ICD-10-CM | POA: Diagnosis not present

## 2021-06-24 DIAGNOSIS — O365930 Maternal care for other known or suspected poor fetal growth, third trimester, not applicable or unspecified: Secondary | ICD-10-CM | POA: Diagnosis not present

## 2021-06-24 DIAGNOSIS — Z3A37 37 weeks gestation of pregnancy: Secondary | ICD-10-CM

## 2021-06-24 LAB — CBC
HCT: 39.5 % (ref 36.0–46.0)
HCT: 37.9 % (ref 36.0–46.0)
Hemoglobin: 13.1 g/dL (ref 12.0–15.0)
Hemoglobin: 12.9 g/dL (ref 12.0–15.0)
MCH: 29.4 pg (ref 26.0–34.0)
MCH: 30 pg (ref 26.0–34.0)
MCHC: 33.2 g/dL (ref 30.0–36.0)
MCHC: 34 g/dL (ref 30.0–36.0)
MCV: 88.6 fL (ref 80.0–100.0)
MCV: 88.1 fL (ref 80.0–100.0)
Platelets: 155 10*3/uL (ref 150–400)
Platelets: 159 10*3/uL (ref 150–400)
RBC: 4.46 MIL/uL (ref 3.87–5.11)
RBC: 4.3 MIL/uL (ref 3.87–5.11)
RDW: 14.6 % (ref 11.5–15.5)
RDW: 14.8 % (ref 11.5–15.5)
WBC: 24.8 10*3/uL — ABNORMAL HIGH (ref 4.0–10.5)
WBC: 28.6 10*3/uL — ABNORMAL HIGH (ref 4.0–10.5)
nRBC: 0 % (ref 0.0–0.2)
nRBC: 0 % (ref 0.0–0.2)

## 2021-06-24 LAB — COMPREHENSIVE METABOLIC PANEL
ALT: 19 U/L (ref 0–44)
ALT: 21 U/L (ref 0–44)
AST: 29 U/L (ref 15–41)
AST: 29 U/L (ref 15–41)
Albumin: 2.5 g/dL — ABNORMAL LOW (ref 3.5–5.0)
Albumin: 2.3 g/dL — ABNORMAL LOW (ref 3.5–5.0)
Alkaline Phosphatase: 138 U/L — ABNORMAL HIGH (ref 38–126)
Alkaline Phosphatase: 137 U/L — ABNORMAL HIGH (ref 38–126)
Anion gap: 11 (ref 5–15)
Anion gap: 10 (ref 5–15)
BUN: 13 mg/dL (ref 6–20)
BUN: 11 mg/dL (ref 6–20)
CO2: 20 mmol/L — ABNORMAL LOW (ref 22–32)
CO2: 20 mmol/L — ABNORMAL LOW (ref 22–32)
Calcium: 7.9 mg/dL — ABNORMAL LOW (ref 8.9–10.3)
Calcium: 7.6 mg/dL — ABNORMAL LOW (ref 8.9–10.3)
Chloride: 100 mmol/L (ref 98–111)
Chloride: 102 mmol/L (ref 98–111)
Creatinine, Ser: 0.88 mg/dL (ref 0.44–1.00)
Creatinine, Ser: 0.78 mg/dL (ref 0.44–1.00)
GFR, Estimated: >60 mL/min (ref >60)
GFR, Estimated: >60 mL/min (ref >60)
Glucose, Bld: 131 mg/dL — ABNORMAL HIGH (ref 70–99)
Glucose, Bld: 129 mg/dL — ABNORMAL HIGH (ref 70–99)
Potassium: 4.1 mmol/L (ref 3.5–5.1)
Potassium: 4.2 mmol/L (ref 3.5–5.1)
Sodium: 131 mmol/L — ABNORMAL LOW (ref 135–145)
Sodium: 132 mmol/L — ABNORMAL LOW (ref 135–145)
Total Bilirubin: 0.5 mg/dL (ref 0.3–1.2)
Total Bilirubin: 0.6 mg/dL (ref 0.3–1.2)
Total Protein: 5.6 g/dL — ABNORMAL LOW (ref 6.5–8.1)
Total Protein: 5.8 g/dL — ABNORMAL LOW (ref 6.5–8.1)

## 2021-06-24 LAB — GLUCOSE, CAPILLARY
Glucose-Capillary: 135 mg/dL — ABNORMAL HIGH (ref 70–99)
Glucose-Capillary: 96 mg/dL (ref 70–99)
Glucose-Capillary: 109 mg/dL — ABNORMAL HIGH (ref 70–99)
Glucose-Capillary: 93 mg/dL (ref 70–99)
Glucose-Capillary: 81 mg/dL (ref 70–99)

## 2021-06-24 LAB — MAGNESIUM
Magnesium: 5.1 mg/dL — ABNORMAL HIGH (ref 1.7–2.4)
Magnesium: 4.8 mg/dL — ABNORMAL HIGH (ref 1.7–2.4)

## 2021-06-24 MED ORDER — PRENATAL MULTIVITAMIN CH
1.0000 | ORAL_TABLET | Freq: Every day | ORAL | Status: DC
  Administered 2021-06-25 – 2021-06-26 (×2): 1 via ORAL
  Filled 2021-06-24 (×2): qty 1

## 2021-06-24 MED ORDER — SIMETHICONE 80 MG PO CHEW: 80.0000 mg | CHEWABLE_TABLET | ORAL | Status: DC | PRN

## 2021-06-24 MED ORDER — OXYTOCIN-SODIUM CHLORIDE 30-0.9 UT/500ML-% IV SOLN: 2.5000 [IU]/h | INTRAVENOUS | Status: DC | PRN

## 2021-06-24 MED ORDER — DIPHENHYDRAMINE HCL 25 MG PO CAPS: 25.0000 mg | ORAL_CAPSULE | Freq: Four times a day (QID) | ORAL | Status: DC | PRN

## 2021-06-24 MED ORDER — BENZOCAINE-MENTHOL 20-0.5 % EX AERO
1.0000 "application " | INHALATION_SPRAY | CUTANEOUS | Status: DC | PRN
  Administered 2021-06-24: 1 via TOPICAL
  Filled 2021-06-24 (×2): qty 56

## 2021-06-24 MED ORDER — DIBUCAINE (PERIANAL) 1 % EX OINT: 1.0000 "application " | TOPICAL_OINTMENT | CUTANEOUS | Status: DC | PRN

## 2021-06-24 MED ORDER — TETANUS-DIPHTH-ACELL PERTUSSIS 5-2.5-18.5 LF-MCG/0.5 IM SUSY: 0.5000 mL | PREFILLED_SYRINGE | Freq: Once | INTRAMUSCULAR | Status: DC

## 2021-06-24 MED ORDER — LACTATED RINGERS IV SOLN: INTRAVENOUS | Status: AC

## 2021-06-24 MED ORDER — ONDANSETRON HCL 4 MG/2ML IJ SOLN: 4.0000 mg | INTRAMUSCULAR | Status: DC | PRN

## 2021-06-24 MED ORDER — NIFEDIPINE ER OSMOTIC RELEASE 30 MG PO TB24
30.0000 mg | ORAL_TABLET | Freq: Every day | ORAL | Status: DC
Start: 2021-06-24 — End: 2021-06-25
  Administered 2021-06-24: 30 mg via ORAL
  Filled 2021-06-24: qty 1

## 2021-06-24 MED ORDER — ONDANSETRON HCL 4 MG PO TABS: 4.0000 mg | ORAL_TABLET | ORAL | Status: DC | PRN

## 2021-06-24 MED ORDER — MAGNESIUM SULFATE 40 GM/1000ML IV SOLN
2.0000 g/h | INTRAVENOUS | Status: AC
  Administered 2021-06-24: 1 g/h via INTRAVENOUS
  Filled 2021-06-24: qty 1000

## 2021-06-24 MED ORDER — ACETAMINOPHEN 325 MG PO TABS
650.0000 mg | ORAL_TABLET | ORAL | Status: DC | PRN
  Administered 2021-06-25: 650 mg via ORAL
  Filled 2021-06-24: qty 2

## 2021-06-24 MED ORDER — OXYCODONE HCL 5 MG PO TABS
10.0000 mg | ORAL_TABLET | Freq: Once | ORAL | Status: AC
  Administered 2021-06-24: 10 mg via ORAL
  Filled 2021-06-24: qty 2

## 2021-06-24 MED ORDER — WITCH HAZEL-GLYCERIN EX PADS: 1.0000 "application " | MEDICATED_PAD | CUTANEOUS | Status: DC | PRN

## 2021-06-24 MED ORDER — OXYCODONE HCL 5 MG PO TABS: 5.0000 mg | ORAL_TABLET | ORAL | Status: DC | PRN

## 2021-06-24 MED ORDER — COCONUT OIL OIL: 1.0000 "application " | TOPICAL_OIL | Status: DC | PRN

## 2021-06-24 MED ORDER — SENNOSIDES-DOCUSATE SODIUM 8.6-50 MG PO TABS: 2.0000 | ORAL_TABLET | Freq: Every evening | ORAL | Status: DC | PRN

## 2021-06-24 MED ORDER — IBUPROFEN 600 MG PO TABS
600.0000 mg | ORAL_TABLET | Freq: Four times a day (QID) | ORAL | Status: DC
  Administered 2021-06-24 – 2021-06-26 (×8): 600 mg via ORAL
  Filled 2021-06-24 (×8): qty 1

## 2021-06-24 NOTE — Progress Notes (Signed)
L&D Note With pushing, fetal head +4, q3-100m UCs with some variables with good recovery, normal baseline and moderate variability, no accel; last contraction fetus tolerated well. Anticipate SVD. Peds at delivery. If fetal tracing becomes non reassuring, then pt would be a good low forceps candidate over kiwi due to efw.  Durene Romans MD Attending Center for [employer] [facility] [employer] (Faculty Practice) 06/24/2021 Time: 1140am

## 2021-06-24 NOTE — Progress Notes (Signed)
L&D Note  06/24/2021 - 8:51 AM  30 y.o. G1 [redacted]w[redacted]d. 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    Subjective:  Pt just woke up from sleeping. No s/s of Mg toxicity  Objective:   Vitals:   06/24/21 0631 06/24/21 0701 06/24/21 0800 06/24/21 0830  BP: (!) 135/95 (!) 134/91 123/87 131/82  Pulse: (!) 105 (!) 102 96 98  Resp:  15 16   Temp:   99.3 F (37.4 C)   TempSrc:   Oral   SpO2:      Weight:      Height:        Current Vital Signs 24h Vital Sign Ranges  T 99.3 F (37.4 C) Temp  Avg: 98.2 F (36.8 C)  Min: 97.6 F (36.4 C)  Max: 99.3 F (37.4 C)  BP 131/82 BP  Min: 111/62  Max: 187/90  HR 98 Pulse  Avg: 87.4  Min: 55  Max: 123  RR 16 Resp  Avg: 16.9  Min: 14  Max: 20  SaO2 98 %   SpO2  Avg: 98 %  Min: 97 %  Max: 99 %       24 Hour I/O Current Shift I/O  Time Ins Outs 11/24 0701 - 11/25 0700 In: 6307.9 [P.O.:630; I.V.:5260.7] Out: 3250 [Urine:3250] 11/25 0701 - 11/25 1900 In: 388 [I.V.:364.6] Out: 225 [Urine:225]   FHR: 140 baseline, no accels or decels now, mod variability Toco: q51m Gen: NAD SVE: complete, +caput to +2 with BPD +1 to 0. FSE and IUPC in place  Labs:  Recent Labs  Lab 06/22/21 2350 06/23/21 1953 06/24/21 0719  WBC 13.4* 18.9* 24.8*  HGB 12.1 12.8 13.1  HCT 36.5 39.1 39.5  PLT 166 179 155   Recent Labs  Lab 06/22/21 2350 06/23/21 1953  NA 134* 135  K 3.9 4.8  CL 105 104  CO2 21* 22  BUN 19 14   CREATININE 0.81 0.86  CALCIUM 8.8* 9.1  PROT 5.7* 5.5*  BILITOT 0.4 1.2  ALKPHOS 123 124  ALT 19 19  AST 22 45*  GLUCOSE 149* 133*    Medications Current Facility-Administered Medications  Medication Dose Route Frequency Provider Last Rate Last Admin   acetaminophen (TYLENOL) tablet 650 mg  650 mg Oral Q4H PRN Myrtis Ser, CNM   650 mg at 06/24/21 4235   diphenhydrAMINE (BENADRYL) capsule 25 mg  25 mg Oral Q6H PRN Janyth Pupa, DO   25 mg at 06/23/21 3614   diphenhydrAMINE (BENADRYL) injection 12.5 mg  12.5 mg Intravenous Q15 min PRN Brennan Bailey, MD   12.5 mg at 06/24/21 0225   ePHEDrine injection 10 mg  10 mg Intravenous PRN Brennan Bailey, MD       ePHEDrine injection 10 mg  10 mg Intravenous PRN Brennan Bailey, MD       fentaNYL (SUBLIMAZE) injection 100 mcg  100 mcg Intravenous Q1H PRN Serita Grammes D, CNM   100 mcg at 06/23/21 4315  fentaNYL 2 mcg/mL w/ bupivacaine 0.125% in NS 250 mL epidural infusion  12 mL/hr Epidural Continuous PRN Brennan Bailey, MD 12 mL/hr at 06/23/21 2203 12 mL/hr at 06/23/21 2203   labetalol (NORMODYNE) injection 20 mg  20 mg Intravenous PRN Renard Matter, MD   20 mg at 06/23/21 2020   And   labetalol (NORMODYNE) injection 40 mg  40 mg Intravenous PRN Renard Matter, MD   40 mg at 06/23/21 2045   And   labetalol (NORMODYNE) injection 80 mg  80 mg Intravenous PRN Renard Matter, MD       And   hydrALAZINE (APRESOLINE) injection 10 mg  10 mg Intravenous PRN Renard Matter, MD       insulin aspart (novoLOG) injection 0-14 Units  0-14 Units Subcutaneous Q4H Renard Matter, MD   2 Units at 06/24/21 0220   lactated ringers amnioinfusion   Intrauterine Continuous Renard Matter, MD   Stopped at 06/24/21 0813   lactated ringers infusion 500 mL  500 mL Intravenous Once Brennan Bailey, MD       lactated ringers infusion 500-1,000 mL  500-1,000 mL Intravenous PRN Myrtis Ser, CNM 999 mL/hr at 06/24/21 0817 500 mL at 06/24/21 7628   lactated ringers infusion    Intravenous Continuous Myrtis Ser, CNM 75 mL/hr at 06/23/21 2000 Rate Change at 06/23/21 2000   lidocaine (PF) (XYLOCAINE) 1 % injection 30 mL  30 mL Subcutaneous PRN Myrtis Ser, CNM       magnesium sulfate 40 grams in SWI 1000 mL OB infusion  2 g/hr Intravenous Titrated Christin Fudge, CNM 50 mL/hr at 06/23/21 2020 2 g/hr at 06/23/21 2020   misoprostol (CYTOTEC) tablet 25 mcg  25 mcg Vaginal Q4H PRN Myrtis Ser, CNM   25 mcg at 06/23/21 0144   misoprostol (CYTOTEC) tablet 50 mcg  50 mcg Buccal Q4H Christin Fudge, CNM   50 mcg at 06/23/21 1610   ondansetron (ZOFRAN) injection 4 mg  4 mg Intravenous Q6H PRN Myrtis Ser, CNM       oxyCODONE-acetaminophen (PERCOCET/ROXICET) 5-325 MG per tablet 1 tablet  1 tablet Oral Q4H PRN Myrtis Ser, CNM       oxyCODONE-acetaminophen (PERCOCET/ROXICET) 5-325 MG per tablet 2 tablet  2 tablet Oral Q4H PRN Myrtis Ser, CNM       oxytocin (PITOCIN) IV BOLUS FROM BAG  333 mL Intravenous Once Myrtis Ser, CNM       oxytocin (PITOCIN) IV infusion 30 units in NS 500 mL - Premix  2.5 Units/hr Intravenous Continuous Serita Grammes D, CNM       oxytocin (PITOCIN) IV infusion 30 units in NS 500 mL - Premix  1-40 milli-units/min Intravenous Titrated Christin Fudge, CNM   Stopped at 06/24/21 0813   penicillin G potassium 3 Million Units in dextrose 73mL IVPB  3 Million Units Intravenous Q4H Myrtis Ser, CNM 100 mL/hr at 06/24/21 0746 3 Million Units at 06/24/21 0746   PHENYLephrine 40 mcg/ml in normal saline Adult IV Push Syringe (For Blood Pressure Support)  80 mcg Intravenous PRN Brennan Bailey, MD       PHENYLephrine 40 mcg/ml in normal saline Adult IV Push Syringe (For Blood Pressure Support)  80 mcg Intravenous PRN Brennan Bailey, MD       sodium citrate-citric acid (ORACIT) solution 30 mL  30 mL Oral Q2H PRN Myrtis Ser, CNM       Facility-Administered Medications Ordered in Other Encounters  Medication Dose Route Frequency Provider Last Rate Last Admin   lidocaine (PF) (XYLOCAINE) 1 % injection   Epidural Anesthesia Intra-op Brennan Bailey, MD   4 mL at 06/23/21 2203    Assessment & Plan:  Pt doing doing well *IUP: category I currently. No AI currently going *IOL: okay to hold off on pitocin for now. Will do some pushing and see how baby tolerates it and if baby comes down properly. Anticipate SVD.  *severe pre-x: continue Mg. Normal level this morning *GBS pos: continue abx *Analgesia: epidural working well  Durene Romans. MD Attending Center for [facility])

## 2021-06-24 NOTE — Progress Notes (Signed)
Ashley Watson is a 30 y.o. G1P0000 at [redacted]w[redacted]d admitted for induction of labor due to preEclampsia w/SF and A2GDM.  Subjective: Feeling comfortable with contractions  Objective: BP (!) 135/95   Pulse (!) 105   Temp 97.8 F (36.6 C) (Axillary)   Resp 16   Ht 5\' 5"  (1.651 m)   Wt 87.6 kg   LMP 10/05/2020   SpO2 98%   BMI 32.14 kg/m  I/O last 3 completed shifts: In: -  Out: 400 [Urine:400] Total I/O In: 6307.9 [P.O.:630; I.V.:5260.7; Other:117.2; IV Piggyback:300] Out: 2850 [Urine:2850]  FHT:  FHR: 140 bpm, variability: moderate,  accelerations:  Present,  decelerations:  Absent UC:   regular, every 3-5 minutes SVE:   Dilation: 6 Effacement (%): 80 Station: 0 Exam by:: Dr. Cy Blamer  Labs: Lab Results  Component Value Date   WBC 18.9 (H) 06/23/2021   HGB 12.8 06/23/2021   HCT 39.1 06/23/2021   MCV 88.7 06/23/2021   PLT 179 06/23/2021    Assessment / Plan: G1P0000 at [redacted]w[redacted]d admitted for induction of labor due to preEclampsia w/SF and A2GDM.  Previously stopped pitocin and got terbutaline due to recurrent late decelerations.  Now resolved. Cervix unchanged from last check but expected since had terbutaline and Pit stopped which led to contractions being spaced out. Pitocin restarted 0615   Preeclampsia:  on magnesium sulfate, BP currently in mild range - Continue Mg - Get labs this AM (CBC, CMP, Mg) - AST 22>45 - Plt 179 Fetal Wellbeing:  Category I Pain Control:  Epidural I/D:   GBS pos>PCN   #A2GDM Q4hr checks  Renard Matter 06/24/2021, 7:00 AM

## 2021-06-25 LAB — GLUCOSE, CAPILLARY: Glucose-Capillary: 88 mg/dL (ref 70–99)

## 2021-06-25 MED ORDER — NIFEDIPINE ER OSMOTIC RELEASE 30 MG PO TB24
30.0000 mg | ORAL_TABLET | Freq: Every day | ORAL | Status: DC
  Administered 2021-06-25: 30 mg via ORAL
  Filled 2021-06-25: qty 1

## 2021-06-25 NOTE — Anesthesia Postprocedure Evaluation (Signed)
Anesthesia Post Note  Patient: Ashley Watson  Procedure(s) Performed: AN AD [place]     Patient location during evaluation: Mother Baby Anesthesia Type: Epidural Level of consciousness: awake and alert and oriented Pain management: satisfactory to patient Vital Signs Assessment: post-procedure vital signs reviewed and stable Respiratory status: respiratory function stable Cardiovascular status: stable Postop Assessment: no headache, no backache, epidural receding, patient able to bend at knees, no signs of nausea or vomiting, adequate PO intake and able to ambulate Anesthetic complications: no   No notable events documented.  Last Vitals:  Vitals:   06/25/21 0456 06/25/21 0630  BP: (!) 148/90   Pulse: 91   Resp: 17 16  Temp: 37.1 C   SpO2: 99%     Last Pain:  Vitals:   06/25/21 0750  TempSrc:   PainSc: 4    Pain Goal: Patients Stated Pain Goal: 3 (06/24/21 1525)                 Katherina Mires

## 2021-06-25 NOTE — Progress Notes (Signed)
Daily Postpartum Note  Admission Date: 06/22/2021 Current Date: 06/25/2021 7:28 AM  Ashley Watson is a 30 y.o. G1P1001 s/p VAVD/1st (repaired) @ [redacted]w[redacted]d, admitted for IOL and d/w severe pre-x during labor.  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   Irritable bowel syndrome 10/15/2012   Migraine without aura 04/11/2007    Overnight/24hr events:  Started on procardia xl qday last night  Subjective:  Feeling tired from the Mg but no overt s/s of Mg toxicity  Objective:    Current Vital Signs 24h Vital Sign Ranges  T 98.8 F (37.1 C) Temp  Avg: 98.7 F (37.1 C)  Min: 98.1 F (36.7 C)  Max: 99.4 F (37.4 C)  BP (!) 148/90  BP  Min: 118/70  Max: 158/95  HR 91 Pulse  Avg: 89  Min: 73  Max: 107  RR 16 Resp  Avg: 16.6  Min: 15  Max: 18  SaO2 99 % Room Air SpO2  Avg: 98.5 %  Min: 98 %  Max: 99 %       24 Hour I/O Current Shift I/O  Time Ins Outs 11/25 0701 - 11/26 0700 In: 4861.3 [P.O.:1290; I.V.:3471.3] Out: 5361 [Urine:5325] No intake/output data recorded.   Patient Vitals for the past 24 hrs:  BP Temp Temp src Pulse Resp SpO2  06/25/21 0630 -- -- -- -- 16 --  06/25/21 0456 (!) 148/90 98.8 F (37.1 C) Oral 91 17 99 %  06/25/21 0230 -- -- -- -- 16 --  06/25/21 0118 -- -- -- -- 17 --  06/25/21 0030 -- -- -- -- 16 --  06/24/21 2314 132/70 98.1 F (36.7 C) Oral 79 16 99 %  06/24/21 2120 -- -- -- -- 17 --  06/24/21 1947 (!) 157/95 98.5 F (36.9 C) -- 91 18 99 %  06/24/21 1853 -- -- -- -- 17 --  06/24/21 1823 -- -- -- -- 16 --  06/24/21 1745 -- -- -- -- 17 --  06/24/21 1627 (!) 158/95 -- -- 89 17 98 %  06/24/21 1525 (!) 154/92 98.8 F (37.1 C) Oral 85 16 98 %   06/24/21 1508 (!) 158/91 99.4 F (37.4 C) Oral 89 15 98 %  06/24/21 1418 (!) 146/94 -- -- 88 -- --  06/24/21 1403 -- 98.3 F (36.8 C) Oral -- -- --  06/24/21 1401 (!) 141/74 -- -- 85 17 --  06/24/21 1346 (!) 141/82 -- -- 81 -- --  06/24/21 1331 134/77 -- -- 94 -- --  06/24/21 1316 139/81 -- -- 85 16 --  06/24/21 1301 (!) 145/92 -- -- 98 18 --  06/24/21 1246 (!) 132/98 -- -- 97 -- --  06/24/21 1241 135/86 -- -- 88 -- --  06/24/21 1200 138/84 98.8 F (37.1 C) Axillary 84 16 --  06/24/21 1030 118/70 -- -- 73 -- --  06/24/21 1001 124/71 -- -- 79 17 --  06/24/21 0958 -- 98.3 F (36.8 C) Oral -- -- --  06/24/21 0930 (!) 142/76 -- -- 93 -- --  06/24/21 0904 138/89 -- -- (!) 107 18 --  06/24/21 0830 131/82 -- -- 98 -- --  06/24/21 0800 123/87 99.3 F (37.4 C) Oral 96 16 --  UOP: >122mL/hr  Physical exam: General: Well nourished, well developed female in no acute distress. Abdomen: nttp Cardiovascular: S1, S2 normal, no murmur, rub or gallop, regular rate and rhythm Respiratory: CTAB Extremities: no clubbing, cyanosis or edema Skin: Warm and dry.   Medications: Current Facility-Administered Medications  Medication Dose Route Frequency Provider Last Rate Last Admin   acetaminophen (TYLENOL) tablet 650 mg  650 mg Oral Q4H PRN Aletha Halim, MD       benzocaine-Menthol (DERMOPLAST) 20-0.5 % topical spray 1 application  1 application Topical PRN Aletha Halim, MD   1 application at 98/33/82 1748   coconut oil  1 application Topical PRN Aletha Halim, MD       witch hazel-glycerin (TUCKS) pad 1 application  1 application Topical PRN Aletha Halim, MD       And   dibucaine (NUPERCAINAL) 1 % rectal ointment 1 application  1 application Rectal PRN Aletha Halim, MD       diphenhydrAMINE (BENADRYL) capsule 25 mg  25 mg Oral Q6H PRN Aletha Halim, MD       ePHEDrine injection 10 mg  10 mg Intravenous PRN Aletha Halim, MD       ePHEDrine injection 10 mg  10 mg  Intravenous PRN Aletha Halim, MD       fentaNYL 2 mcg/mL w/ bupivacaine 0.125% in NS 250 mL epidural infusion  12 mL/hr Epidural Continuous PRN Aletha Halim, MD 12 mL/hr at 06/23/21 2203 12 mL/hr at 06/23/21 2203   labetalol (NORMODYNE) injection 20 mg  20 mg Intravenous PRN Aletha Halim, MD   20 mg at 06/23/21 2020   And   labetalol (NORMODYNE) injection 40 mg  40 mg Intravenous PRN Aletha Halim, MD   40 mg at 06/23/21 2045   And   labetalol (NORMODYNE) injection 80 mg  80 mg Intravenous PRN Aletha Halim, MD       And   hydrALAZINE (APRESOLINE) injection 10 mg  10 mg Intravenous PRN Aletha Halim, MD       ibuprofen (ADVIL) tablet 600 mg  600 mg Oral Q6H Aletha Halim, MD   600 mg at 06/25/21 0500   lactated ringers infusion 500 mL  500 mL Intravenous Once Aletha Halim, MD       lactated ringers infusion   Intravenous Continuous Aletha Halim, MD 75 mL/hr at 06/25/21 0117 New Bag at 06/25/21 0117   magnesium sulfate 40 grams in SWI 1000 mL OB infusion  2 g/hr Intravenous Titrated Aletha Halim, MD 50 mL/hr at 06/24/21 2328 2 g/hr at 06/24/21 2328   NIFEdipine (PROCARDIA-XL/NIFEDICAL-XL) 24 hr tablet 30 mg  30 mg Oral QHS Aletha Halim, MD       ondansetron (ZOFRAN) tablet 4 mg  4 mg Oral Q4H PRN Aletha Halim, MD       Or   ondansetron (ZOFRAN) injection 4 mg  4 mg Intravenous Q4H PRN Aletha Halim, MD       oxyCODONE (Oxy IR/ROXICODONE) immediate release tablet 5 mg  5 mg Oral Q4H PRN Aletha Halim, MD       oxytocin (PITOCIN) IV infusion 30 units in NS 500 mL - Premix  2.5 Units/hr Intravenous Continuous PRN Aletha Halim, MD   Stopped at 06/24/21 1731   PHENYLephrine 40 mcg/ml in normal saline Adult IV Push Syringe (For Blood Pressure Support)  80 mcg Intravenous PRN Aletha Halim, MD       PHENYLephrine 40 mcg/ml in normal saline Adult IV Push Syringe (For Blood Pressure Support)  80  mcg Intravenous PRN Aletha Halim, MD        prenatal multivitamin tablet 1 tablet  1 tablet Oral Q1200 Aletha Halim, MD       senna-docusate (Senokot-S) tablet 2 tablet  2 tablet Oral QHS PRN Aletha Halim, MD       simethicone (MYLICON) chewable tablet 80 mg  80 mg Oral PRN Aletha Halim, MD       Tdap Durwin Reges) injection 0.5 mL  0.5 mL Intramuscular Once Aletha Halim, MD        Labs:  Recent Labs  Lab 06/23/21 1953 06/24/21 0719 06/24/21 1619  WBC 18.9* 24.8* 28.6*  HGB 12.8 13.1 12.9  HCT 39.1 39.5 37.9  PLT 179 155 159    Recent Labs  Lab 06/23/21 1953 06/24/21 1106 06/24/21 1619  NA 135 131* 132*  K 4.8 4.1 4.2  CL 104 100 102  CO2 22 20* 20*  BUN 14 13 11   CREATININE 0.86 0.88 0.78  CALCIUM 9.1 7.9* 7.6*  PROT 5.5* 5.6* 5.8*  BILITOT 1.2 0.5 0.6  ALKPHOS 124 138* 137*  ALT 19 19 21   AST 45* 29 29  GLUCOSE 133* 131* 129*   Radiology:  none  Assessment & Plan:  Pt doing well *PP: A POS. [facility] at [facility] *severe pre-x: Mg until noon today.  *GDMa1: AM cbg 88 *PPx: scds *FEN/GI: regular diet, mivf *Dispo: likely tomorrow morning  Durene Romans MD Attending Center for [place] (Faculty Practice) GYN Consult Phone: [phone] (M-F, [phone]) & [phone]  (Off hours, weekends, holidays)

## 2021-06-26 LAB — GLUCOSE, CAPILLARY: Glucose-Capillary: 90 mg/dL (ref 70–99)

## 2021-06-26 MED ORDER — IBUPROFEN 600 MG PO TABS: 600.0000 mg | ORAL_TABLET | Freq: Four times a day (QID) | ORAL | 0 refills | Status: DC | PRN

## 2021-06-26 MED ORDER — ACETAMINOPHEN 500 MG PO TABS: 500.0000 mg | ORAL_TABLET | Freq: Four times a day (QID) | ORAL | 0 refills | Status: DC | PRN

## 2021-06-26 MED ORDER — LISINOPRIL 5 MG PO TABS: 5.0000 mg | ORAL_TABLET | Freq: Every day | ORAL | 0 refills | Status: DC

## 2021-06-26 MED ORDER — LISINOPRIL 10 MG PO TABS
5.0000 mg | ORAL_TABLET | Freq: Every day | ORAL | Status: DC
  Administered 2021-06-26: 10:00:00 5 mg via ORAL
  Filled 2021-06-26: qty 1

## 2021-06-26 MED ORDER — BUTALBITAL-APAP-CAFFEINE 50-325-40 MG PO TABS: ORAL_TABLET | ORAL | 0 refills | Status: DC

## 2021-06-26 MED ORDER — NIFEDIPINE ER 30 MG PO TB24: 30.0000 mg | ORAL_TABLET | Freq: Every day | ORAL | 0 refills | Status: DC

## 2021-06-26 MED ORDER — BUTALBITAL-APAP-CAFFEINE 50-325-40 MG PO TABS
1.0000 | ORAL_TABLET | Freq: Four times a day (QID) | ORAL | Status: DC | PRN
  Administered 2021-06-26: 11:00:00 1 via ORAL
  Filled 2021-06-26: qty 1

## 2021-06-26 NOTE — Discharge Summary (Signed)
Postpartum Discharge Summary     Patient Name: Ashley Watson DOB: July 23, 1991 MRN: [id number]  Date of admission: 06/22/2021 Delivery date:06/24/2021  Delivering provider: Aletha Halim  Date of discharge: 06/26/2021  Admitting diagnosis: Pregnancy at 37/1. IOL for severe FGR. Known mild pre-eclampsia. History of Migraines    Discharge diagnosis: Term Pregnancy Delivered. Status post severe pre-eclampsia (BPs)                                               Augmentation:  cytotec, pitocin Complications: Variables prior to delivery  Hospital course: Patient had an uncomplicated IOL with low VAVD delivery due to variables just prior to delivery; she had a 1st degree that was repaired.  Postpartum, she was kept on Mg for 24 hours. She was started on procardia and lisinopril. Birth control method undecided.   Physical exam  Vitals:   06/26/21 1043 06/26/21 1148 06/26/21 1205 06/26/21 1358  BP: 130/73 (!) 172/91 (!) 154/94 (!) 149/88  Pulse: (!) 102 (!) 101 (!) 101 (!) 101  Resp:      Temp:      TempSrc:      SpO2:      Weight:      Height:       General: alert Lochia: appropriate Uterine Fundus: nttp DVT Evaluation: No evidence of DVT seen on physical exam. Labs: CBC Latest Ref Rng & Units 06/24/2021 06/24/2021 06/23/2021  WBC 4.0 - 10.5 K/uL 28.6(H) 24.8(H) 18.9(H)  Hemoglobin 12.0 - 15.0 g/dL 12.9 13.1 12.8  Hematocrit 36.0 - 46.0 % 37.9 39.5 39.1  Platelets 150 - 400 K/uL 159 155 179   A POS  Edinburgh Score: No flowsheet data found.   After visit meds:  Allergies as of 06/26/2021       Reactions   Latex Rash        Medication List     STOP taking these medications    onetouch ultrasoft lancets   OneTouch Verio test strip Generic drug: glucose blood       TAKE these medications    acetaminophen 500 MG tablet Commonly known as: TYLENOL Take 1 tablet (500 mg total) by mouth every 6 (six) hours as needed (for pain scale < 4).    butalbital-acetaminophen-caffeine 50-325-40 MG tablet Commonly known as: FIORICET 1 tab po qday PRN HA   ibuprofen 600 MG tablet Commonly known as: ADVIL Take 1 tablet (600 mg total) by mouth every 6 (six) hours as needed.   lisinopril 5 MG tablet Commonly known as: ZESTRIL Take 1 tablet (5 mg total) by mouth daily. Start taking on: June 27, 2021   NIFEdipine 30 MG 24 hr tablet Commonly known as: ADALAT CC Take 1 tablet (30 mg total) by mouth at bedtime.   PRENATAL VITAMIN PO Take by mouth.         Discharge home in stable condition Infant Feeding: Breast Infant Disposition: NICU at [facility] due to lack of bedspace at [facility] Discharge instruction: per After Visit Summary and Postpartum booklet. Activity: Advance as tolerated. Pelvic rest for 6 weeks.  Diet: routine diet Future Appointments:No future appointments. Follow up Visit:  [place] for [place] at [facility]. Go in 5 day(s).   Specialty: Obstetrics and Gynecology Why: call the office on tuesday if you haven't heard  about this friday blood pressure check Contact information: [address] [place] [address] [phone]                  06/26/2021 Aletha Halim, MD

## 2021-06-26 NOTE — Discharge Instructions (Signed)
Continue to check your blood pressures twice a day Call the office for blood pressures that are consistently above 155 for the top number or 105 for the bottom number   Hypertension During Pregnancy Hypertension is also called high blood pressure. High blood pressure means that the force of your blood moving in your body is too strong. It can cause problems for you and your baby. Different types of high blood pressure can happen during pregnancy. The types are: High blood pressure before you got pregnant. This is called chronic hypertension.  This can continue during your pregnancy. Your doctor will want to keep checking your blood pressure. You may need medicine to keep your blood pressure under control while you are pregnant. You will need follow-up visits after you have your baby. High blood pressure that goes up during pregnancy when it was normal before. This is called gestational hypertension. It will usually get better after you have your baby, but your doctor will need to watch your blood pressure to make sure that it is getting better. Very high blood pressure during pregnancy. This is called preeclampsia. Very high blood pressure is an emergency that needs to be checked and treated right away. You may develop very high blood pressure after giving birth. This is called postpartum preeclampsia. This usually occurs within 48 hours after childbirth but may occur up to 6 weeks after giving birth. This is rare. How does this affect me? If you have high blood pressure during pregnancy, you have a higher chance of developing high blood pressure: As you get older. If you get pregnant again. In some cases, high blood pressure during pregnancy can cause: Stroke. Heart attack. Damage to the kidneys, lungs, or liver. Preeclampsia. Jerky movements you cannot control (convulsions or seizures). Problems with the placenta.  What can I do to lower my risk?  Keep a healthy weight. Eat a healthy  diet. Follow what your doctor tells you about treating any medical problems that you had before becoming pregnant. It is very important to go to all of your doctor visits. Your doctor will check your blood pressure and make sure that your pregnancy is progressing as it should. Treatment should start early if a problem is found.  Follow these instructions at home:  Take your blood pressure 1-2 times per day. Call the office if your blood pressure is 155 or higher for the top number or 105 or higher for the bottom number.    Eating and drinking  Drink enough fluid to keep your pee (urine) pale yellow. Avoid caffeine. Lifestyle Do not use any products that contain nicotine or tobacco, such as cigarettes, e-cigarettes, and chewing tobacco. If you need help quitting, ask your doctor. Do not use alcohol or drugs. Avoid stress. Rest and get plenty of sleep. Regular exercise can help. Ask your doctor what kinds of exercise are best for you. General instructions Take over-the-counter and prescription medicines only as told by your doctor. Keep all prenatal and follow-up visits as told by your doctor. This is important. Contact a doctor if: You have symptoms that your doctor told you to watch for, such as: Headaches. Nausea. Vomiting. Belly (abdominal) pain. Dizziness. Light-headedness. Get help right away if: You have: Very bad belly pain that does not get better with treatment. A very bad headache that does not get better. Vomiting that does not get better. Sudden, fast weight gain. Sudden swelling in your hands, ankles, or face. Blood in your pee. Blurry vision. Double vision.  Shortness of breath. Chest pain. Weakness on one side of your body. Trouble talking. Summary High blood pressure is also called hypertension. High blood pressure means that the force of your blood moving in your body is too strong. High blood pressure can cause problems for you and your baby. Keep all  follow-up visits as told by your doctor. This is important. This information is not intended to replace advice given to you by your health care provider. Make sure you discuss any questions you have with your health care provider. Document Released: 08/19/2010 Document Revised: 11/07/2018 Document Reviewed: 08/13/2018 Elsevier Patient Education  2020 [employer].

## 2021-06-26 NOTE — Progress Notes (Signed)
Post Partum Day 2 Subjective: no complaints, up ad lib, voiding, tolerating PO, and + flatus  Objective: Blood pressure (!) 142/90, pulse 95, temperature 98.1 F (36.7 C), temperature source Oral, resp. rate 18, height 5\' 5"  (1.651 m), weight 87.6 kg, last menstrual period 10/05/2020, SpO2 98 %.  Physical Exam:  General: alert, cooperative, and no distress Lochia: appropriate Uterine Fundus: firm Incision:  DVT Evaluation: No evidence of DVT seen on physical exam.  Recent Labs    06/24/21 0719 06/24/21 1619  HGB 13.1 12.9  HCT 39.5 37.9    Assessment/Plan: Assess for discharge later today, Dr Ilda Basset is aware Begin lisinopril 5 mg for mildly elevated BP   LOS: 4 days   Ashley Watson 06/26/2021, 9:09 AM

## 2021-06-27 ENCOUNTER — Ambulatory Visit: Payer: No Typology Code available for payment source

## 2021-06-27 ENCOUNTER — Telehealth: Payer: Self-pay | Admitting: General Practice

## 2021-06-27 NOTE — Telephone Encounter (Signed)
Transition Care Management Unsuccessful Follow-up Telephone Call  Date of discharge and from where:  06/26/21 from [place]  Attempts:  1st Attempt  Reason for unsuccessful TCM follow-up call:  No answer/busy

## 2021-06-28 NOTE — Telephone Encounter (Signed)
Transition Care Management Follow-up Telephone Call Date of discharge and from where: 06/26/2021 from [facility] How have you been since you were released from the hospital? Pt stated that she is feeling well and does not have any questions or concerns.  Any questions or concerns? No  Items Reviewed: Did the pt receive and understand the discharge instructions provided? Yes  Medications obtained and verified? Yes  Other? No  Any new allergies since your discharge? No  Dietary orders reviewed? No Do you have support at home? Yes   Functional Questionnaire: (I = Independent and D = Dependent) ADLs: I  Bathing/Dressing- I  Meal Prep- I  Eating- I  Maintaining continence- I  Transferring/Ambulation- I  Managing Meds- I  Follow up appointments reviewed:  PCP Hospital f/u appt confirmed? No   Specialist Hospital f/u appt confirmed? Yes  Scheduled to see [facility] Nurse on 07/01/2021 @ 9:20am. Are transportation arrangements needed? No  If their condition worsens, is the pt aware to call PCP or go to the Emergency Dept.? Yes Was the patient provided with contact information for the PCP's office or ED? Yes Was to pt encouraged to call back with questions or concerns? Yes

## 2021-06-29 LAB — SURGICAL PATHOLOGY

## 2021-07-01 ENCOUNTER — Other Ambulatory Visit: Payer: Self-pay

## 2021-07-01 ENCOUNTER — Encounter: Payer: Self-pay | Admitting: *Deleted

## 2021-07-01 ENCOUNTER — Ambulatory Visit: Payer: No Typology Code available for payment source | Admitting: *Deleted

## 2021-07-01 VITALS — BP 127/85 | HR 120 | Ht 65.0 in | Wt 183.6 lb

## 2021-07-01 DIAGNOSIS — Z0130 Encounter for examination of blood pressure without abnormal findings: Secondary | ICD-10-CM

## 2021-07-01 NOTE — Progress Notes (Signed)
Pt presents for PP BP check.  She reports no H/A since leaving hospital on 11/27.  She also denies visual disturbances. BP = 127/85, P - 120. Pt advised to continue medication as prescribed. She has PP appt scheduled in office on 12/23 @ 0915. Pt voiced concerns regarding breast milk supply. Her baby is still in NICU and she is pumping every 4 hours. She was informed that I will have our lactation consultant reach out to her. Pt expressed gratitude and voiced understanding of all information and instructions given.

## 2021-07-04 ENCOUNTER — Telehealth: Payer: Self-pay | Admitting: Lactation Services

## 2021-07-04 ENCOUNTER — Telehealth: Payer: Self-pay

## 2021-07-04 NOTE — Telephone Encounter (Signed)
BP: 122/89 Pulse: 120 Pt denies any SOB or Chest pain at this time. Pt states also wanting to talk with Lactation. Pt to speak with Ivin Booty, RN today.   Colletta Maryland, RN

## 2021-07-04 NOTE — Telephone Encounter (Signed)
Mom was able to bring infant home today. She was sent home with Neosure ready to feed and want infant to get 4 bottles of Neosure 24 a day.   Mom has a lot of breast milk at home. Gave her recipe to make 24 calorie breast milk by mixing 1/2 tsp powder per 45 ml EBM or 1 tsp per 90 ml of breast milk.   He will go to Pediatrician on Wednesday.   Mom was able to rent a pump from [employer] and her milk supply is much better.   Reviewed storage of opened formula and thawed breast milk as well as warming of infant milk in hot water.   Mom voiced understanding to above. Lactation appointment made, she will call with any questions or concerns as needed prior to appt.

## 2021-07-04 NOTE — Telephone Encounter (Addendum)
-----   Message from Clarnce Flock, MD sent at 07/04/2021 12:03 PM EST ----- Please give patient a call, her pulse was quite high at the time of her visit. Please assess for any chest pain or shortness of breath, and if possible have her check her pulse if she has a BP machine. If all of that is fine she can just follow up with me for her postpartum visit.

## 2021-07-07 ENCOUNTER — Telehealth (HOSPITAL_COMMUNITY): Payer: Self-pay | Admitting: *Deleted

## 2021-07-07 NOTE — Telephone Encounter (Signed)
Mom reports feeling good. BP checks at home. No concerns about herself at this time. EPDS not completed today as PHQ9 score on 12/2 was 0. (No Hospital score) Mom reports baby is doing well. Feeding, peeing, and pooping without difficulty. Safe sleep reviewed. Mom reports no concerns about baby at present.  Odis Hollingshead, RN 07-07-2021 at 11:20am

## 2021-07-22 ENCOUNTER — Other Ambulatory Visit: Payer: Self-pay

## 2021-07-22 ENCOUNTER — Encounter: Payer: Self-pay | Admitting: Family Medicine

## 2021-07-22 ENCOUNTER — Ambulatory Visit (INDEPENDENT_AMBULATORY_CARE_PROVIDER_SITE_OTHER): Payer: No Typology Code available for payment source | Admitting: Family Medicine

## 2021-07-22 DIAGNOSIS — Z8632 Personal history of gestational diabetes: Secondary | ICD-10-CM | POA: Diagnosis not present

## 2021-07-22 DIAGNOSIS — O24419 Gestational diabetes mellitus in pregnancy, unspecified control: Secondary | ICD-10-CM | POA: Insufficient documentation

## 2021-07-22 DIAGNOSIS — O24414 Gestational diabetes mellitus in pregnancy, insulin controlled: Secondary | ICD-10-CM | POA: Insufficient documentation

## 2021-07-22 DIAGNOSIS — Z8759 Personal history of other complications of pregnancy, childbirth and the puerperium: Secondary | ICD-10-CM | POA: Diagnosis not present

## 2021-07-22 HISTORY — DX: Gestational diabetes mellitus in pregnancy, insulin controlled: O24.414

## 2021-07-22 MED ORDER — LISINOPRIL 5 MG PO TABS: 5.0000 mg | ORAL_TABLET | Freq: Every day | ORAL | 0 refills | Status: DC

## 2021-07-22 MED ORDER — NIFEDIPINE ER 30 MG PO TB24: 30.0000 mg | ORAL_TABLET | Freq: Every day | ORAL | 0 refills | Status: DC

## 2021-07-22 NOTE — Progress Notes (Signed)
[place] Partum Visit Note  Ashley Watson is a 30 y.o. G30P0000 female who presents for a postpartum visit. She is 5 weeks postpartum following a vacuum-assisted vaginal delivery.  I have fully reviewed the prenatal and intrapartum course. The delivery was at 37.1 gestational weeks.  Anesthesia: epidural. Postpartum course has been uneventful. Baby is doing well. Baby is feeding by both breast and bottle - Enfamil Neosure . Bleeding no bleeding. Bowel function is normal. Bladder function is normal. Patient is not sexually active. Contraception method is condoms and vasectomy. Postpartum depression screening: negative.   The pregnancy intention screening data noted above was reviewed. Potential methods of contraception were discussed. The patient elected to proceed with No data recorded.   Edinburgh Postnatal Depression Scale - 07/22/21 0935       Edinburgh Postnatal Depression Scale:  In the Past 7 Days   I have been able to laugh and see the funny side of things. 0    I have looked forward with enjoyment to things. 0    I have blamed myself unnecessarily when things went wrong. 0    I have been anxious or worried for no good reason. 0    I have felt scared or panicky for no good reason. 0    Things have been getting on top of me. 0    I have been so unhappy that I have had difficulty sleeping. 0    I have felt sad or miserable. 0    I have been so unhappy that I have been crying. 1    The thought of harming myself has occurred to me. 0    Edinburgh Postnatal Depression Scale Total 1             Health Maintenance Due  Topic Date Due   COVID-19 Vaccine (1) Never done   Pneumococcal Vaccine 22-34 Years old (1 - PCV) Never done    The following portions of the patient's history were reviewed and updated as appropriate: allergies, current medications, past family history, past medical history, past social history, past surgical history, and problem list.  Review of  Systems Pertinent items noted in HPI and remainder of comprehensive ROS otherwise negative.  Objective:  BP (!) 143/82    Pulse (!) 101    Ht 5\' 5"  (1.651 m)    Wt 183 lb 12.8 oz (83.4 kg)    LMP  (LMP Unknown)    Breastfeeding Yes    BMI 30.59 kg/m    General:  alert, cooperative, and appears stated age   Breasts:  not indicated  Lungs: Comfortable on room air  GU exam:  not indicated       Assessment:    There are no diagnoses linked to this encounter.  Normal postpartum exam.   Plan:   Essential components of care per ACOG recommendations:  1.  Mood and well being: Patient with negative depression screening today. Reviewed local resources for support.  - Patient tobacco use? No.   - hx of drug use? No.    2. Infant care and feeding:  -Patient currently breastmilk feeding? Yes. Reviewed importance of draining breast regularly to support lactation.  -Social determinants of health (SDOH) reviewed in EPIC. No concerns  3. Sexuality, contraception and birth spacing - Patient does not want a pregnancy in the next year.  Desired family size is 1 children.  - Reviewed forms of contraception in tiered fashion. Patient desired condoms, vasectomy today.   -  Discussed birth spacing of 18 months  4. Sleep and fatigue -Encouraged family/partner/community support of 4 hrs of uninterrupted sleep to help with mood and fatigue  5. Physical Recovery  - Discussed patients delivery and complications. She describes her labor as good. - Patient had a  VAVD . Patient had a 1st degree laceration. Perineal healing reviewed. Patient expressed understanding - Patient has urinary incontinence? No. - Patient is safe to resume physical and sexual activity  6.  Health Maintenance - HM due items addressed No - uptodate - Last pap smear  Diagnosis  Date Value Ref Range Status  01/30/2019   Final   NEGATIVE FOR INTRAEPITHELIAL LESIONS OR MALIGNANCY.   Pap smear not done at today's visit.  -Breast  Cancer screening indicated? No.   7. Chronic Disease/Pregnancy Condition follow up: Hypertension - Patient took Nifedipine and Lisinopril as prescribed right before visit, mild range BP. Recommend she continue for another month, stop meds 3 days prior to nurse visit for BP check - will also do 2hr GTT at that time - PCP follow up  Clarnce Flock, [place] for [place], [place]

## 2021-07-23 ENCOUNTER — Other Ambulatory Visit: Payer: Self-pay | Admitting: Obstetrics and Gynecology

## 2021-08-17 ENCOUNTER — Other Ambulatory Visit: Payer: Self-pay

## 2021-08-17 ENCOUNTER — Ambulatory Visit: Payer: No Typology Code available for payment source

## 2021-08-17 DIAGNOSIS — Z8632 Personal history of gestational diabetes: Secondary | ICD-10-CM

## 2021-08-18 ENCOUNTER — Other Ambulatory Visit: Payer: No Typology Code available for payment source

## 2021-08-18 ENCOUNTER — Ambulatory Visit (INDEPENDENT_AMBULATORY_CARE_PROVIDER_SITE_OTHER): Payer: No Typology Code available for payment source

## 2021-08-18 ENCOUNTER — Other Ambulatory Visit: Payer: Self-pay

## 2021-08-18 VITALS — BP 135/79 | HR 89 | Wt 181.6 lb

## 2021-08-18 DIAGNOSIS — Z0130 Encounter for examination of blood pressure without abnormal findings: Secondary | ICD-10-CM

## 2021-08-18 DIAGNOSIS — Z8632 Personal history of gestational diabetes: Secondary | ICD-10-CM

## 2021-08-18 NOTE — Progress Notes (Signed)
Pt here today for BP check.  Pt reports that she was taking Lisinopril 5 mg daily and Nifedipine 30 mg daily however was recommended by a provider to stop taking medications.  Pt reports that her last doses was about 5 days ago.  Pt denies headaches and visual disturbances.  BP 135/79.  Pt had pp visit on 07/22/21 with Dione Plover, MD.  Pt does not have any other concerns.  Pt advised to continue to monitor for HTN sx's.  Pt verbalized understanding with no further questions.   Ashley Watson  08/18/21

## 2021-08-19 LAB — GLUCOSE TOLERANCE, 2 HOURS
Glucose, 2 hour: 159 mg/dL — ABNORMAL HIGH (ref 70–139)
Glucose, GTT - Fasting: 113 mg/dL — ABNORMAL HIGH (ref 70–99)

## 2021-08-23 ENCOUNTER — Other Ambulatory Visit: Payer: Self-pay | Admitting: Family Medicine

## 2021-08-26 ENCOUNTER — Telehealth (INDEPENDENT_AMBULATORY_CARE_PROVIDER_SITE_OTHER): Payer: No Typology Code available for payment source | Admitting: Sports Medicine

## 2021-08-26 DIAGNOSIS — Z8632 Personal history of gestational diabetes: Secondary | ICD-10-CM

## 2021-08-26 DIAGNOSIS — D229 Melanocytic nevi, unspecified: Secondary | ICD-10-CM

## 2021-08-26 NOTE — Assessment & Plan Note (Signed)
This is a pleasant 31 year old female 2 months postpartum, gestational diabetes, post pregnancy fasting glucose was 113, 2-hour postprandial was 159. I do think we need a hemoglobin A1c. She did gain a lot of weight, 90 pounds over the past several years, 45 to 50 pounds over her pregnancy, she has lost maybe 20 postpartum. I would like to get routine labs including CBC, CMP, TSH, lipid panel, A1c. She also need to schedule a physical at some point.

## 2021-08-26 NOTE — Progress Notes (Signed)
° °  Virtual Visit via Telephone   I connected with  Ashley Watson  on 08/26/21 by telephone/telehealth and verified that I am speaking with the correct person using two identifiers.   I discussed the limitations, risks, security and privacy concerns of performing an evaluation and management service by telephone, including the higher likelihood of inaccurate diagnosis and treatment, and the availability of in person appointments.  We also discussed the likely need of an additional face to face encounter for complete and high quality delivery of care.  I also discussed with the patient that there may be a patient responsible charge related to this service. The patient expressed understanding and wishes to proceed.  Provider location is in medical facility. Patient location is at their home, different from provider location. People involved in care of the patient during this telehealth encounter were myself, my nurse/medical assistant, and my front office/scheduling team member.  Review of Systems: No fevers, chills, night sweats, weight loss, chest pain, or shortness of breath.   Objective Findings:    General: Speaking full sentences, no audible heavy breathing.  Sounds alert and appropriately interactive.    Independent interpretation of tests performed by another provider:   None.  Brief History, Exam, Impression, and Recommendations:    History of gestational diabetes This is a pleasant 31 year old female 2 months postpartum, gestational diabetes, post pregnancy fasting glucose was 113, 2-hour postprandial was 159. I do think we need a hemoglobin A1c. She did gain a lot of weight, 90 pounds over the past several years, 45 to 50 pounds over her pregnancy, she has lost maybe 20 postpartum. I would like to get routine labs including CBC, CMP, TSH, lipid panel, A1c. She also need to schedule a physical at some point.   [place] Picture saved in chart from March 2021, when she comes in  for her physical we can revisit this.   I discussed the above assessment and treatment plan with the patient. The patient was provided an opportunity to ask questions and all were answered. The patient agreed with the plan and demonstrated an understanding of the instructions.   The patient was advised to call back or seek an in-person evaluation if the symptoms worsen or if the condition fails to improve as anticipated.   I provided 30 minutes of verbal and non-verbal time during this encounter date, time was needed to gather information, review chart, records, communicate/coordinate with staff remotely, as well as complete documentation.   ___________________________________________ Gwen Her. Dianah Field, M.D., ABFM., CAQSM. Primary Care and Sports Medicine [facility]  Adjunct Professor of [place] [employer] of Medicine

## 2021-08-26 NOTE — Assessment & Plan Note (Signed)
Picture saved in chart from March 2021, when she comes in for her physical we can revisit this.

## 2021-09-19 ENCOUNTER — Ambulatory Visit: Payer: No Typology Code available for payment source | Admitting: Sports Medicine

## 2021-09-19 ENCOUNTER — Other Ambulatory Visit: Payer: Self-pay

## 2021-09-19 VITALS — BP 116/80 | HR 91 | Wt 184.7 lb

## 2021-09-19 DIAGNOSIS — Z8632 Personal history of gestational diabetes: Secondary | ICD-10-CM

## 2021-09-19 DIAGNOSIS — M7989 Other specified soft tissue disorders: Secondary | ICD-10-CM

## 2021-09-19 LAB — POCT URINALYSIS DIP (CLINITEK)
Bilirubin, UA: NEGATIVE
Blood, UA: NEGATIVE
Glucose, UA: NEGATIVE mg/dL
Ketones, POC UA: NEGATIVE mg/dL
Nitrite, UA: NEGATIVE
POC PROTEIN,UA: NEGATIVE
Spec Grav, UA: 1.025 (ref 1.010–1.025)
Urobilinogen, UA: 0.2 E.U./dL
pH, UA: 6 (ref 5.0–8.0)

## 2021-09-19 LAB — POCT GLYCOSYLATED HEMOGLOBIN (HGB A1C): Hemoglobin A1C: 5.9 % — AB (ref 4.0–5.6)

## 2021-09-19 NOTE — Assessment & Plan Note (Addendum)
This is a pleasant 31 year old female, she is approximately 3 months postpartum, she gained a lot of weight during pregnancy, she did have severe preeclampsia. She had an abnormal glucose tolerance test, we did a hemoglobin A1c, 5.9% indicating prediabetes. I think the goal here will be aggressive weight loss, we will aim for 10 pounds over the next 2 months, I gave her an exercise prescription and some dietary instructions. If she does not at the target we will consider phentermine. Vital signs were normal today. When she has lost some weight I think we can then check her routine labs.

## 2021-09-19 NOTE — Progress Notes (Signed)
° ° °  Procedures performed today:    None.  Independent interpretation of notes and tests performed by another provider:   None.  Brief History, Exam, Impression, and Recommendations:    Prediabetic with a history of gestational diabetes This is a pleasant 31 year old female, she is approximately 3 months postpartum, she gained a lot of weight during pregnancy, she did have severe preeclampsia. She had an abnormal glucose tolerance test, we did a hemoglobin A1c, 5.9% indicating prediabetes. I think the goal here will be aggressive weight loss, we will aim for 10 pounds over the next 2 months, I gave her an exercise prescription and some dietary instructions. If she does not at the target we will consider phentermine. Vital signs were normal today. When she has lost some weight I think we can then check her routine labs.    ___________________________________________ Gwen Her. Dianah Field, M.D., ABFM., CAQSM. Primary Care and [place] Instructor of [place] [employer] [facility] of Medicine

## 2021-11-14 ENCOUNTER — Telehealth: Payer: No Typology Code available for payment source | Admitting: Sports Medicine

## 2022-04-30 NOTE — Progress Notes (Deleted)
   GYNECOLOGY OFFICE VISIT NOTE  History:   Ashley Watson is a 31 y.o. G1P0000 here today for ***.   She denies any abnormal vaginal discharge, bleeding, pelvic pain or other concerns.     Past Medical History:  Diagnosis Date   Dysmenorrhea    Migraines    Type 2 diabetes mellitus ([place])     Past Surgical History:  Procedure Laterality Date   LAPAROSCOPIC UNILATERAL SALPINGECTOMY Left 07/08/2020   Procedure: LAPARSCOPIC left salpingectomy, chromotubation,  lysis of adhesions, and electrosurgical excision and vaporization of ovarian and peritoneal endometriotic lesions;  Surgeon: Governor Specking, MD;  Location: [facility];  Service: Gynecology;  Laterality: Left;   NO PAST SURGERIES     TOOTH EXTRACTION  2016    The following portions of the patient's history were reviewed and updated as appropriate: allergies, current medications, past family history, past medical history, past social history, past surgical history and problem list.   Health Maintenance:   Diagnosis  Date Value Ref Range Status  01/30/2019   Final   NEGATIVE FOR INTRAEPITHELIAL LESIONS OR MALIGNANCY.   Review of Systems:  Pertinent items noted in HPI and remainder of comprehensive ROS otherwise negative.  Physical Exam:  There were no vitals taken for this visit. CONSTITUTIONAL: Well-developed, well-nourished female in no acute distress.  HEENT:  Normocephalic, atraumatic. External right and left ear normal. No scleral icterus.  NECK: Normal range of motion, supple, no masses noted on observation SKIN: No rash noted. Not diaphoretic. No erythema. No pallor. MUSCULOSKELETAL: Normal range of motion. No edema noted. NEUROLOGIC: Alert and oriented to person, place, and time. Normal muscle tone coordination. No cranial nerve deficit noted. PSYCHIATRIC: Normal mood and affect. Normal behavior. Normal judgment and thought content.  CARDIOVASCULAR: Normal heart rate noted RESPIRATORY:  Effort and breath sounds normal, no problems with respiration noted ABDOMEN: No masses noted. No other overt distention noted.    PELVIC: {Blank single:19197::"Deferred","Normal appearing external genitalia; normal urethral meatus; normal appearing vaginal mucosa and cervix.  No abnormal discharge noted.  Normal uterine size, no other palpable masses, no uterine or adnexal tenderness. Performed in the presence of a chaperone"}  Labs and Imaging No results found for this or any previous visit (from the past 168 hour(s)). No results found.  Assessment and Plan:   1. Pelvic pain ***  ***Schedule f/u and annual appt   Diagnoses and all orders for this visit:  Pelvic pain    Routine preventative health maintenance measures emphasized. Please refer to After Visit Summary for other counseling recommendations.   No follow-ups on file.  Radene Gunning, MD, [place] for [facility], [place]

## 2022-05-04 ENCOUNTER — Ambulatory Visit: Payer: No Typology Code available for payment source | Admitting: Obstetrics and Gynecology

## 2022-05-04 DIAGNOSIS — R102 Pelvic and perineal pain: Secondary | ICD-10-CM

## 2022-05-25 ENCOUNTER — Other Ambulatory Visit (HOSPITAL_COMMUNITY)
Admission: RE | Admit: 2022-05-25 | Discharge: 2022-05-25 | Disposition: A | Discharge status: Home / Self Care | Payer: No Typology Code available for payment source | Source: Ambulatory Visit | Attending: Obstetrics and Gynecology | Admitting: Obstetrics and Gynecology

## 2022-05-25 ENCOUNTER — Ambulatory Visit: Payer: No Typology Code available for payment source | Admitting: Obstetrics and Gynecology

## 2022-05-25 ENCOUNTER — Encounter: Payer: Self-pay | Admitting: Obstetrics and Gynecology

## 2022-05-25 VITALS — BP 139/84 | HR 96 | Resp 16 | Ht 65.0 in | Wt 169.0 lb

## 2022-05-25 DIAGNOSIS — Z01411 Encounter for gynecological examination (general) (routine) with abnormal findings: Secondary | ICD-10-CM | POA: Diagnosis not present

## 2022-05-25 DIAGNOSIS — R1032 Left lower quadrant pain: Secondary | ICD-10-CM | POA: Diagnosis not present

## 2022-05-25 NOTE — Progress Notes (Signed)
Subjective:     Ashley Watson is a 31 y.o. female P1 with LMP 05/06/22 and BMI 28 is here for a comprehensive physical exam. The patient reports LLQ pain for the first two days of her menstrual cycle. She reports a 4-5 day period monthly. She reports some improvement in her pain with tylenol/ibuprofen. She denies dyspareunia. The pain is only present during her cycle. Patient had a previous [place] left salpingectomy. She is sexually active using condoms for contraception. She is without any other complaints  Past Medical History:  Diagnosis Date   Dysmenorrhea    Migraines    Type 2 diabetes mellitus ([place])    Past Surgical History:  Procedure Laterality Date   LAPAROSCOPIC UNILATERAL SALPINGECTOMY Left 07/08/2020   Procedure: LAPARSCOPIC left salpingectomy, chromotubation,  lysis of adhesions, and electrosurgical excision and vaporization of ovarian and peritoneal endometriotic lesions;  Surgeon: Governor Specking, MD;  Location: [facility];  Service: Gynecology;  Laterality: Left;   NO PAST SURGERIES     TOOTH EXTRACTION  2016   Family History  Problem Relation Age of Onset   Diabetes Father    Hypertension Father    Gout Father     Social History   Socioeconomic History   Marital status: Married    Spouse name: [name]   Number of children: Not on file   Years of education: Not on file   Highest education level: Not on file  Occupational History   Occupation: customer service  Tobacco Use   Smoking status: Never   Smokeless tobacco: Never  Vaping Use   Vaping Use: Never used  Substance and Sexual Activity   Alcohol use: Not Currently    Alcohol/week: 1.0 standard drink of alcohol    Types: 1 Glasses of wine per week    Comment: occ x 1 week wine   Drug use: Not Currently    Frequency: 7.0 times per week    Types: Marijuana    Comment: none since preg   Sexual activity: Yes    Partners: Male    Birth control/protection: None  Other Topics Concern    Not on file  Social History Narrative   Not on file   Social Determinants of Health   Financial Resource Strain: Not on file  Food Insecurity: No Food Insecurity (02/10/2021)   Hunger Vital Sign    Worried About Running Out of Food in the Last Year: Never true    Ran Out of Food in the Last Year: Never true  Transportation Needs: No Transportation Needs (02/10/2021)   PRAPARE - Hydrologist (Medical): No    Lack of Transportation (Non-Medical): No  Physical Activity: Not on file  Stress: Not on file  Social Connections: Not on file  Intimate Partner Violence: Not on file   Health Maintenance  Topic Date Due   COVID-19 Vaccine (1) Never done   HPV VACCINES (2 - 3-dose series) 06/04/2012   PAP SMEAR-Modifier  01/29/2022   INFLUENZA VACCINE  02/28/2022   TETANUS/TDAP  04/28/2031   Hepatitis C Screening  Completed   HIV Screening  Completed       Review of Systems Pertinent items noted in HPI and remainder of comprehensive ROS otherwise negative.   Objective:  Blood pressure 139/84, pulse 96, resp. rate 16, height '5\' 5"'$  (1.651 m), weight 169 lb (76.7 kg), last menstrual period 05/06/2022, not currently breastfeeding.   GENERAL: Well-developed, well-nourished female in no acute distress.  HEENT: Normocephalic, atraumatic. Sclerae anicteric.  NECK: Supple. Normal thyroid.  LUNGS: Clear to auscultation bilaterally.  HEART: Regular rate and rhythm. BREASTS: Symmetric in size. No palpable masses or lymphadenopathy, skin changes, or nipple drainage. ABDOMEN: Soft, nontender, nondistended. No organomegaly. PELVIC: Normal external female genitalia. Vagina is pink and rugated.  Normal discharge. Normal appearing cervix. Uterus is normal in size. No adnexal mass or tenderness. Chaperone present during the pelvic exam EXTREMITIES: No cyanosis, clubbing, or edema, 2+ distal pulses.     Assessment:    Healthy female exam.      Plan:    Pap smear  collected Patient declined STI testing Pelvic ultrasound ordered Patient will be contacted with abnormal results See After Visit Summary for Counseling Recommendations

## 2022-05-30 ENCOUNTER — Other Ambulatory Visit: Payer: No Typology Code available for payment source

## 2022-05-30 LAB — CYTOLOGY - PAP
Comment: NEGATIVE
Diagnosis: NEGATIVE
High risk HPV: NEGATIVE

## 2022-09-13 HISTORY — PX: OTHER SURGICAL HISTORY: SHX169

## 2022-09-14 ENCOUNTER — Telehealth: Payer: Self-pay | Admitting: General Practice

## 2022-09-14 NOTE — Telephone Encounter (Signed)
Transition Care Management Unsuccessful Follow-up Telephone Call  Date of discharge and from where:  09/13/22 from [facility]  Attempts:  1st Attempt  Reason for unsuccessful TCM follow-up call:  No answer/busy

## 2022-09-15 NOTE — Telephone Encounter (Signed)
Transition Care Management Unsuccessful Follow-up Telephone Call  Date of discharge and from where:  09/13/22 from [facility]  Attempts:  2nd Attempt  Reason for unsuccessful TCM follow-up call:  No answer/busy

## 2022-09-18 NOTE — Transitions of Care (Post Inpatient/ED Visit) (Signed)
   09/18/2022  Name: Ashley Watson MRN: [id number] DOB: [birth year]  Today's TOC FU Call Status: Today's TOC FU Call Status:: Unsuccessful Call (3rd Attempt) Unsuccessful Call (3rd Attempt) Date: 09/18/22  Attempted to reach the patient regarding the most recent Inpatient/ED visit.  Follow Up Plan: No further outreach attempts will be made at this time. We have been unable to contact the patient.  Signature Tinnie Gens, Therapist, sports, BSN

## 2022-09-26 NOTE — Unmapped External Note (Signed)
 Procedure(s): OPEN TREATMENT DISTAL RADIUS FRACTURE  Procedure Note  Nyelli Samara   [id number] 09/26/2022   Pre-op Diagnosis: Other closed extra-articular fracture of distal end of right radius, initial encounter [S52.551A]     Post-op Diagnosis: SAME  CPT Code: Procedure:    OPEN TREATMENT DISTAL RADIUS FRACTURE CPT(R) Code:  74392 - PR OPEN RX DISTAL RADIUS FX, EXTRA-ARTICULAR .  ICD-10 : Post-Op Diagnosis Codes:    * Other closed extra-articular fracture of distal end of right radius, initial encounter [S52.551A]  Surgeon(s) and Role:    * Phelps, Lynwood Ade, MD - Primary  Laterality : Right  Anesthesia: regional   Staff:  Circulator: Vannie Jon SAUNDERS, RN Relief Scrub: Maranda Josette Dire Scrub Person: Thedora Arabia  Estimated Blood Loss: 30 cc  Total IV Fluids : 1000 ml  Urinary Output: 0 ml               Specimens: No specimens collected  Findings: Displaced right extra-articular distal radius fracture with loss of radial height radial inclination and 15 degrees of dorsal tilt.          Drains: None  Implants:  Implant Name Type Inv. Item Serial No. Manufacturer Lot No. LRB No. Used Action  PLATE STANDARD 3 HOLE BONE GEMINUS TITANIUM RADIAL RIGHT DISTAL VOLAR NONSTERILE - [id number] Plate PLATE STANDARD 3 HOLE BONE GEMINUS TITANIUM RADIAL RIGHT DISTAL VOLAR NONSTERILE    Right 1 Implanted  PEG FIXATION TITANIUM L17 MM OD2 MM LOCK SMOOTH - [id number] Other PEG FIXATION TITANIUM L17 MM OD2 MM LOCK SMOOTH    Right 2 Implanted  PEG FIXATION GEMINUS TITANIUM L18 MM OD2 MM FOSSA RADIUS DISTAL VOLAR SMOOTH LOCK SCREW CADDY NONSTERILE - [id number] Other PEG FIXATION GEMINUS TITANIUM L18 MM OD2 MM FOSSA RADIUS DISTAL VOLAR SMOOTH LOCK SCREW CADDY NONSTERILE    Right 1 Implanted  PEG FIXATION GEMINUS TITANIUM L19 MM OD2 MM FOSSA RADIUS DISTAL VOLAR SMOOTH LOCK SCREW CADDY NONSTERILE - [id number] Other PEG FIXATION GEMINUS TITANIUM L19 MM OD2 MM FOSSA RADIUS  DISTAL VOLAR SMOOTH LOCK SCREW CADDY NONSTERILE    Right 2 Implanted  PEG FIXATION GEMINUS TITANIUM L20 MM OD2 MM FOSSA RADIUS DISTAL VOLAR SMOOTH LOCK SCREW CADDY NONSTERILE - [id number] Other PEG FIXATION GEMINUS TITANIUM L20 MM OD2 MM FOSSA RADIUS DISTAL VOLAR SMOOTH LOCK SCREW CADDY NONSTERILE    Right 2 Implanted  PEG FIXATION GEMINUS TITANIUM L19 MM OD2 MM FOSSA RADIUS DISTAL VOLAR SMOOTH LOCK SCREW CADDY NONSTERILE - [id number] Other PEG FIXATION GEMINUS TITANIUM L19 MM OD2 MM FOSSA RADIUS DISTAL VOLAR SMOOTH LOCK SCREW CADDY NONSTERILE    Right 1 Implanted  PEG FIXATION GEMINUS TITANIUM L18 MM OD2 MM FOSSA RADIUS DISTAL VOLAR SMOOTH LOCK SCREW CADDY NONSTERILE - [id number] Other PEG FIXATION GEMINUS TITANIUM L18 MM OD2 MM FOSSA RADIUS DISTAL VOLAR SMOOTH LOCK SCREW CADDY NONSTERILE    Right 2 Implanted     Complications: None  Status: stable  Plan: discharge home  PACU to do list: pulse checks  Contact: Lynwood Ade Curly, MD, Service: Orthopedics, Pager # [phone]  Indication: 32 year old right-hand-dominant female had a fall sustaining a closed displaced extra-articular distal radius fracture she was referred to my office for definitive management on initial visit we discussed nonoperative versus operative management after discussing she like to proceed with nonoperative management.  She followed up yesterday and we reviewed her clinical exam and radiographs which demonstrated loss of reduction and we discussed treatment options including nonoperative versus operative management after discussing  patient like to proceed with surgical intervention she understood that there is no guarantees elected proceed operative consent was obtained at this time.  Operative procedure: The patient's right wrist was marked operative consent was confirmed.  The patient received a regional block to the right upper extremity by the anesthesia team.  She was then taken to the operative theater placed in the  supine position where monitored anesthesia was initiated by the anesthesia team.  All bony prominences were padded appropriately.  Patient received 2 g of Ancef  for perioperative antibiotics.  Tourniquet was placed to the right upper extremity.  Preparatory timeout was formed and completed the right upper extremities and prepped and draped in the normal sterile fashion.  A procedural timeout was formed and completed the right upper extremity was elevated Esmarch tourniquet was placed up to 150 mmHg for total 107 minutes.  We performed a flexor carpi radialis approach dissected down through subcutaneous tissues identified the flexor carpi radialis tendon and retracted it ulnarly.  We then identified the flexor pollicis longus tendon and was retracted ulnarly we released the sheath distally.  We then identified the pronator quadratus which was released off the radial aspect of the radial shaft and using a periosteal elevator we elevated the pronator quadratus ulnarly.  Once this was completed we then turned our attention to the fracture we debrided the fracture with a rongeur and using a Ceola we began to mobilize the distal fragment.  We attempted a reduction however the reduction was unacceptable.  Therefore the decision was made to identify the first dorsal extensor compartment and this was released and the tendons were protected we then released the brachial radialis off of the radial styloid.  We then pronated the shaft out of the incision and released the dorsal periosteum.  Once this was completed we were able to reduce the distal fragment to the shaft and then provisionally placed our skeletal dynamics standard 3-hole distal radius plate and secured it with K wires.  We confirmed reduction and placement of our plate we then placed a cortical screw proximally in the shaft.  We then placed 2 cortical screws in the distal row 1 in the ulnar column one of the radial column we then confirmed our reduction placement  of hardware under fluoroscopy.  We then placed locking screws into the remaining distal holes and then placed 2 cortical screws in the shaft we then exchanged the cortical screws distally for locking screws.  Final fluoroscopic radiographs were obtained demonstrate anatomic alignment stabilization with a volar plate and screws there is no violation of the screws of the dorsal cortex.  There was full passive range of motion with flexion extension radial and ulnar deviation.  Distal radial ulnar joint was stable in neutral pronation and supination.  We then irrigated the incision and the tourniquet was placed down bleeding was controlled electrocautery.  We then closed the subtendinous tissue with 4-0 Monocryl suture then we closed the epidermal layer with 4-0 Monocryl suture in a subicular stitch fashion radial and ulnar pulses were 2+.  The fingers were pink capillary refill less than 2 seconds we then placed Steri-Strips Adaptic moist 4 x 4 and a volar resting splint was applied.  The patient was then taken to day surgery for recovery in stable condition.  Postoperative plan: Patient will be discharged through day surgery per protocol.  She will be nonweightbearing no lifting with right upper extremity she will follow-up in the office in 1 week for wound check  and transition to a removable volar resting splint.  Electronically signed by: Lynwood Charlie Curly, MD Date: 09/26/2022  Time: 3:17 PM            Electronically signed by: Curly Lynwood Charlie, MD 09/26/22 1524

## 2022-10-17 DIAGNOSIS — M25631 Stiffness of right wrist, not elsewhere classified: Secondary | ICD-10-CM | POA: Insufficient documentation

## 2022-10-17 DIAGNOSIS — S52551D Other extraarticular fracture of lower end of right radius, subsequent encounter for closed fracture with routine healing: Secondary | ICD-10-CM

## 2022-10-17 DIAGNOSIS — Z4789 Encounter for other orthopedic aftercare: Secondary | ICD-10-CM | POA: Insufficient documentation

## 2022-10-17 HISTORY — DX: Other extraarticular fracture of lower end of right radius, subsequent encounter for closed fracture with routine healing: S52.551D

## 2022-11-10 NOTE — Progress Notes (Signed)
 Orthopaedic Surgery Follow-Up Visit 11/10/2022  HPI:  32 year old right hand dominant female who presents today for follow up status post open treatment right distal radius fracture on 09/26/2022. She states she has been doing fairly well.  She does feel like it is a little tight when she tries to supinate.  Patient reports she has been participating with therapy she has no pain.  Physical exam: Vitals:   11/10/22 1504  Weight: 72.6 kg (160 lb)  Height: 1.651 m (5' 5)     Appearance: healthy, no acute distress, well-groomed HEENT: EOMI, mucous membranes moist CV: RRR Pulm: breathing comfortably Right wrist: Volar incisions healed soft nontender palpation minimal edema.  No open wounds no abrasions.  Sensations intact to light touch superficial radial median ulnar nerve distribution abductor pollicis brevis 5\5 first dorsal interosseous 5\5 abductor digit minimi 5\5.  Nontender palpation dorsal distal radius, dorsal distal radial ulnar joint, dorsal distal ulna.  Radial pulses 2+ decreased range of motion with wrist flexion extension and supination full active range of motion with pronation EPL and FPL are intact.  Imaging:  Radiology Results (last 30 days)     Procedure Component Value Units Date/Time   XR Wrist Minimum 3 Views Right [[id number]] Resulted: 11/10/22 1521   Order Status: Completed Updated: 11/10/22 1521   Narrative:     Intra-articular distal radius fracture stabilized by volar plate and  screws in acceptable position fracture is healed no dislocations no  abnormal lesions.         Assessment/Plan: 1. Closed extraarticular fracture of distal end of right radius with routine healing  XR Wrist Minimum 3 Views Right     Reviewed clinical exam and radiographs at the present time patient is making progress with no pain improving range of motion at the present time recommend  1) continue therapy 2) continue home exercise program 3) activity as tolerated 4)  follow-up in 6 weeks for repeat exam.   Lynwood SAUNDERS. Anitra, MD MPT 11/10/2022 3:10 PM

## 2023-01-16 ENCOUNTER — Encounter: Payer: Self-pay | Admitting: Medical-Surgical

## 2023-01-16 ENCOUNTER — Ambulatory Visit (INDEPENDENT_AMBULATORY_CARE_PROVIDER_SITE_OTHER): Payer: No Typology Code available for payment source | Admitting: Medical-Surgical

## 2023-01-16 VITALS — BP 113/71 | HR 88 | Resp 20 | Ht 65.0 in | Wt 152.1 lb

## 2023-01-16 DIAGNOSIS — R197 Diarrhea, unspecified: Secondary | ICD-10-CM | POA: Diagnosis not present

## 2023-01-16 DIAGNOSIS — L609 Nail disorder, unspecified: Secondary | ICD-10-CM | POA: Diagnosis not present

## 2023-01-16 NOTE — Progress Notes (Signed)
        Established patient visit  History, exam, impression, and plan:  1. Diarrhea, unspecified type Pleasant 32 year old female presenting today with complaints of 2 days of diarrhea that occurs first thing in the morning and is accompanied by lower abdominal pressure/tenderness.  Diarrhea is described as liquidy and pebbly.  Notes a decreased appetite however is able to eat and drink without difficulty.  Had a little bit of nausea this morning but has had no vomiting.  Denies fever, chills, melena, hematochezia, mucousy stools, abnormal stool odor, recent travel, recent antibiotics, or recent hospitalizations.  No new medications, restaurants, or foods.  Notes that she is very gassy frequently and does have a history of IBS flares.  No known sick contacts.  On exam abdomen soft, nondistended.  Bowel sounds positive x 4 quadrants.  Bilateral lower quadrant tenderness with deep palpation.  Discussed possible viral etiology versus flare of IBS.  Would like to give this a few more days of monitoring as I suspect this will resolve on its own.  Avoiding antidiarrheals.  Checking labs as below.  Recommend a bland diet with small frequent meals.  Stay well-hydrated.  If S/S worsen or new concerns develop, return for further evaluation. - CBC with Differential/Platelet - COMPLETE METABOLIC PANEL WITH GFR - TSH - Amylase - Lipase  2. Nail abnormality She does have concerns with all 5 nails on her right hand.  Notes that she broke her arm in February which required surgery and casting.  Since then, she has had an area of the nailbed that is raised but seems to be growing out.  On evaluation, no evidence of fungal infection or concerning abnormality of the fingernails.  Feel this is likely in relation to the recent surgery/injury that she underwent and the following care that was necessary.  Continue to monitor the nails for any concerns however I suspect this will completely grow out over the next couple of  months and her nails will return to normal.   Procedures performed this visit: None.  Return if symptoms worsen or fail to improve.  __________________________________ Thayer Ohm, DNP, APRN, FNP-BC Primary Care and Sports Medicine [facility]

## 2023-01-17 ENCOUNTER — Encounter: Payer: Self-pay | Admitting: Medical-Surgical

## 2023-01-17 LAB — COMPLETE METABOLIC PANEL WITH GFR
AG Ratio: 1.8 (calc) (ref 1.0–2.5)
ALT: 14 U/L (ref 6–29)
AST: 14 U/L (ref 10–30)
Albumin: 4.3 g/dL (ref 3.6–5.1)
Alkaline phosphatase (APISO): 76 U/L (ref 31–125)
BUN: 13 mg/dL (ref 7–25)
CO2: 26 mmol/L (ref 20–32)
Calcium: 9.1 mg/dL (ref 8.6–10.2)
Chloride: 106 mmol/L (ref 98–110)
Creat: 0.77 mg/dL (ref 0.50–0.97)
Globulin: 2.4 g/dL (calc) (ref 1.9–3.7)
Glucose, Bld: 110 mg/dL — ABNORMAL HIGH (ref 65–99)
Potassium: 4.2 mmol/L (ref 3.5–5.3)
Sodium: 140 mmol/L (ref 135–146)
Total Bilirubin: 0.4 mg/dL (ref 0.2–1.2)
Total Protein: 6.7 g/dL (ref 6.1–8.1)
eGFR: 106 mL/min/{1.73_m2} (ref >=60)

## 2023-01-17 LAB — CBC WITH DIFFERENTIAL/PLATELET
Absolute Monocytes: 444 cells/uL (ref 200–950)
Basophils Absolute: 24 cells/uL (ref 0–200)
Basophils Relative: 0.2 %
Eosinophils Absolute: 96 cells/uL (ref 15–500)
Eosinophils Relative: 0.8 %
HCT: 39.8 % (ref 35.0–45.0)
Hemoglobin: 12.9 g/dL (ref 11.7–15.5)
Lymphs Abs: 3024 cells/uL (ref 850–3900)
MCH: 28.2 pg (ref 27.0–33.0)
MCHC: 32.4 g/dL (ref 32.0–36.0)
MCV: 87.1 fL (ref 80.0–100.0)
MPV: 14.4 fL — ABNORMAL HIGH (ref 7.5–12.5)
Monocytes Relative: 3.7 %
Neutro Abs: 8412 cells/uL — ABNORMAL HIGH (ref 1500–7800)
Neutrophils Relative %: 70.1 %
Platelets: 231 10*3/uL (ref 140–400)
RBC: 4.57 10*6/uL (ref 3.80–5.10)
RDW: 13 % (ref 11.0–15.0)
Total Lymphocyte: 25.2 %
WBC: 12 10*3/uL — ABNORMAL HIGH (ref 3.8–10.8)

## 2023-01-17 LAB — LIPASE: Lipase: 19 U/L (ref 7–60)

## 2023-01-17 LAB — AMYLASE: Amylase: 42 U/L (ref 21–101)

## 2023-01-17 LAB — TSH: TSH: 0.99 mIU/L

## 2023-05-29 ENCOUNTER — Ambulatory Visit: Payer: No Typology Code available for payment source | Admitting: Obstetrics and Gynecology

## 2023-06-01 ENCOUNTER — Ambulatory Visit (INDEPENDENT_AMBULATORY_CARE_PROVIDER_SITE_OTHER): Payer: No Typology Code available for payment source | Admitting: Obstetrics and Gynecology

## 2023-06-01 ENCOUNTER — Other Ambulatory Visit (HOSPITAL_COMMUNITY)
Admission: RE | Admit: 2023-06-01 | Discharge: 2023-06-01 | Disposition: A | Discharge status: Home / Self Care | Payer: No Typology Code available for payment source | Source: Ambulatory Visit | Attending: Obstetrics and Gynecology | Admitting: Obstetrics and Gynecology

## 2023-06-01 ENCOUNTER — Encounter: Payer: Self-pay | Admitting: Obstetrics and Gynecology

## 2023-06-01 ENCOUNTER — Ambulatory Visit: Payer: No Typology Code available for payment source

## 2023-06-01 VITALS — BP 135/72 | HR 91 | Ht 65.0 in | Wt 142.0 lb

## 2023-06-01 DIAGNOSIS — R102 Pelvic and perineal pain: Secondary | ICD-10-CM

## 2023-06-01 DIAGNOSIS — Z113 Encounter for screening for infections with a predominantly sexual mode of transmission: Secondary | ICD-10-CM

## 2023-06-01 DIAGNOSIS — E119 Type 2 diabetes mellitus without complications: Secondary | ICD-10-CM

## 2023-06-01 DIAGNOSIS — Z01419 Encounter for gynecological examination (general) (routine) without abnormal findings: Secondary | ICD-10-CM

## 2023-06-01 DIAGNOSIS — Z0000 Encounter for general adult medical examination without abnormal findings: Secondary | ICD-10-CM

## 2023-06-01 MED ORDER — DEXCOM G7 SENSOR MISC: 1.0000 | Freq: Every day | 3 refills | Status: DC

## 2023-06-01 NOTE — Progress Notes (Signed)
GYNECOLOGY ANNUAL PREVENTATIVE CARE ENCOUNTER NOTE  History:     Ashley Watson is a 32 y.o. G31P1001 female here for a routine annual gynecologic exam.  Current complaints: pelvic pain.   Denies abnormal vaginal bleeding, discharge, pelvic pain, problems with intercourse or other gynecologic concerns.   + pelvic pain during period and randomly.  Trying to conceive now.  Recently diagnosed with Type 2 DM, not checking BS.    Gynecologic History Patient's last menstrual period was 05/21/2023. Contraception: none Last Pap: 04/2022. Result was normal with negative HPV   Obstetric History OB History  Gravida Para Term Preterm AB Living  1 1 1  0 0 1  SAB IAB Ectopic Multiple Live Births  0 0 0 0      # Outcome Date GA Lbr Len/2nd Weight Sex Type Anes PTL Lv  1 Term             Past Medical History:  Diagnosis Date   Dysmenorrhea    Migraines    Type 2 diabetes mellitus (HCC)     Past Surgical History:  Procedure Laterality Date   LAPAROSCOPIC UNILATERAL SALPINGECTOMY Left 07/08/2020   Procedure: LAPARSCOPIC left salpingectomy, chromotubation,  lysis of adhesions, and electrosurgical excision and vaporization of ovarian and peritoneal endometriotic lesions;  Surgeon: Ashley Schwab, MD;  Location: [facility];  Service: Gynecology;  Laterality: Left;   NO PAST SURGERIES     TOOTH EXTRACTION  2016    No current outpatient medications on file prior to visit.   No current facility-administered medications on file prior to visit.    Allergies  Allergen Reactions   Latex Rash    Social History:  reports that she has never smoked. She has never used smokeless tobacco. She reports that she does not currently use alcohol after a past usage of about 1.0 standard drink of alcohol per week. She reports that she does not currently use drugs after having used the following drugs: Marijuana. Frequency: 7.00 times per week.  Family History  Problem Relation  Age of Onset   Diabetes Father    Hypertension Father    Gout Father     The following portions of the patient's history were reviewed and updated as appropriate: allergies, current medications, past family history, past medical history, past social history, past surgical history and problem list.  Review of Systems Pertinent items noted in HPI and remainder of comprehensive ROS otherwise negative.  Physical Exam:  BP 135/72   Pulse 91   Ht 5\' 5"  (1.651 m)   Wt 142 lb (64.4 kg)   LMP 05/21/2023   BMI 23.63 kg/m  CONSTITUTIONAL: Well-developed, well-nourished female in no acute distress.  HENT:  Normocephalic, atraumatic, External right and left ear normal.  EYES: Conjunctivae and EOM are normal. Pupils are equal, round, and reactive to light. No scleral icterus.  NECK: Normal range of motion, supple, no masses.  Normal thyroid.  SKIN: Skin is warm and dry. No rash noted. Not diaphoretic. No erythema. No pallor. MUSCULOSKELETAL: Normal range of motion. No tenderness.  No cyanosis, clubbing, or edema. NEUROLOGIC: Alert and oriented to person, place, and time. Normal reflexes, muscle tone coordination.  PSYCHIATRIC: Normal mood and affect. Normal behavior. Normal judgment and thought content. CARDIOVASCULAR: Normal heart rate noted, regular rhythm RESPIRATORY: Clear to auscultation bilaterally. Effort and breath sounds normal, no problems with respiration noted. BREASTS: Symmetric in size. No masses, tenderness, skin changes, nipple drainage, or lymphadenopathy bilaterally. Performed in the  presence of a chaperone. ABDOMEN: Soft, no distention noted.  No tenderness, rebound or guarding.  PELVIC: Normal appearing external genitalia and urethral meatus.   Normal uterine size, no other palpable masses, no uterine or adnexal tenderness.  Performed in the presence of a chaperone.   Assessment and Plan:    1. Annual physical exam  - US PELVIC COMPLETE WITH TRANSVAGINAL; Future - TSH -  T4, free - HgB A1c - Protein / creatinine ratio, urine - Planning for pregnancy, start PV and 1 mg folic acid daily.  2. Screening examination for STI  - Cervicovaginal ancillary only( Ashley Watson)  3. Type 2 diabetes mellitus without complication, unspecified whether long term insulin use (HCC)  Has PCP Not checking BS Rx Dexcom G7 Will discuss further DM treatment once HgbA1c and dexcom data is available for review.    Normal breast examination today, she was advised to perform periodic self breast examinations.  Routine preventative health maintenance measures emphasized. Please refer to After Visit Summary for other counseling recommendations.    Ashley Watson, Ashley Rutherford, NP [facility] [employer], [facility]

## 2023-06-01 NOTE — Progress Notes (Signed)
Last pap- 05/25/22- negative

## 2023-06-04 ENCOUNTER — Other Ambulatory Visit: Payer: Self-pay | Admitting: *Deleted

## 2023-06-04 LAB — CERVICOVAGINAL ANCILLARY ONLY
Bacterial Vaginitis (gardnerella): NEGATIVE
Candida Glabrata: NEGATIVE
Candida Vaginitis: NEGATIVE
Chlamydia: NEGATIVE
Comment: NEGATIVE
Comment: NEGATIVE
Comment: NEGATIVE
Comment: NEGATIVE
Comment: NEGATIVE
Comment: NORMAL
Neisseria Gonorrhea: NEGATIVE
Trichomonas: NEGATIVE

## 2023-06-04 MED ORDER — DEXCOM G7 SENSOR MISC: 1.0000 | Freq: Every day | 3 refills | Status: DC

## 2023-06-05 ENCOUNTER — Telehealth: Payer: Self-pay | Admitting: *Deleted

## 2023-06-05 NOTE — Telephone Encounter (Signed)
Patient will let office know when she can come for labs.

## 2023-06-20 LAB — HEMOGLOBIN A1C
Est. average glucose Bld gHb Est-mCnc: 123 mg/dL
Hgb A1c MFr Bld: 5.9 % — ABNORMAL HIGH (ref 4.8–5.6)

## 2023-06-20 LAB — TSH: TSH: 1.75 u[IU]/mL (ref 0.450–4.500)

## 2023-06-20 LAB — T4, FREE: Free T4: 1.22 ng/dL (ref 0.82–1.77)

## 2023-08-01 NOTE — L&D Delivery Note (Signed)
   Delivery Note:   G3P1011 at [redacted]w[redacted]d  Admitting diagnosis: Labor and delivery, indication for care [O75.9] Risks:  Patient Active Problem List   Diagnosis Date Noted   Precipitous delivery 07/02/2024   Gestational hypertension, third trimester 07/01/2024   Labor and delivery, indication for care 07/01/2024   Marginal insertion of umbilical cord affecting management of mother 02/28/2024   History of prior pregnancy with IUGR newborn 01/31/2024   History of vacuum extraction assisted delivery 01/31/2024   Supervision of high risk pregnancy, antepartum 01/04/2024   Gestational diabetes mellitus in pregnancy, insulin  controlled 07/22/2021   History of severe pre-eclampsia 06/22/2021      First Stage:  Induction of labor: for A2GDM.  Onset of labor: 07/01/24 @ 0553 Augmentation: Pitocin  and Cytotec  ROM: questionable SROM?  Active labor onset: 07/02/24 @ 0643 Analgesia /Anesthesia/Pain control intrapartum: Epidural  Second Stage:  Complete dilation at 07/02/2024 0824 Onset of pushing at 1104 FHR second stage CAT II tracing    CNM called to patient bedside at 11:39am and CNM arrived to room @ 11:41am. Upon arrival Patient in side-lying positioning and Fetus in the warmer being assisted by RROB, RN. CNM overheard Code APGAR being called.    Delivery of a Live born female  Birth Weight: 6 lb 6.3 oz (2900 g) APGAR: Please see NICU notes for details.  Newborn Delivery   Birth date/time: 07/02/2024 11:41:00 Delivery type: Vaginal, Spontaneous     CNM collected cord gases and delivered placenta.   Collection of cord blood for typing completed. Cord blood donation- N/A  Arterial cord blood sample-  pending   Third Stage:  Placenta delivered-Spontaneous with 3 vessels. Uterine tone firm bleeding minimal Uterotonics: IV pit bolus initiated.  Placenta to L&D for dispo.  2nd degree laceration identified.  Episiotomy:None Local analgesia: N/A   Repair: N/A  Est. Blood Loss  (mL):50.0  Complications: Precipitous vaginal delivery.    Mom to postpartum.  Baby boy to Couplet care / Skin to Skin.  Delivery Report:  Review the Delivery Report for details.    Chen Saadeh Erven) Emilio, MSN, CNM  [facility]  07/02/24 12:13 PM

## 2023-09-10 ENCOUNTER — Telehealth: Payer: Self-pay | Admitting: *Deleted

## 2023-09-10 DIAGNOSIS — N912 Amenorrhea, unspecified: Secondary | ICD-10-CM

## 2023-09-10 NOTE — Telephone Encounter (Cosign Needed)
 Pt called stating that her LMP was 08/09/23.  She has done several UPT that have been slightly positive.  She is having breast tenderness and some vaginal spotting this AM.  Pt to come in for a BHCG to verify whether pregnant or just starting her cycle.

## 2023-09-12 LAB — BETA HCG QUANT (REF LAB): hCG Quant: 23 m[IU]/mL

## 2023-09-13 ENCOUNTER — Encounter: Payer: Self-pay | Admitting: Obstetrics and Gynecology

## 2023-09-13 ENCOUNTER — Ambulatory Visit: Payer: No Typology Code available for payment source

## 2023-09-13 ENCOUNTER — Ambulatory Visit: Payer: No Typology Code available for payment source | Admitting: Obstetrics and Gynecology

## 2023-09-13 VITALS — BP 133/77 | HR 80 | Resp 16 | Ht 65.0 in | Wt 140.0 lb

## 2023-09-13 DIAGNOSIS — Z3A01 Less than 8 weeks gestation of pregnancy: Secondary | ICD-10-CM | POA: Diagnosis not present

## 2023-09-13 DIAGNOSIS — Z3A00 Weeks of gestation of pregnancy not specified: Secondary | ICD-10-CM | POA: Diagnosis not present

## 2023-09-13 DIAGNOSIS — O26851 Spotting complicating pregnancy, first trimester: Secondary | ICD-10-CM | POA: Diagnosis not present

## 2023-09-13 DIAGNOSIS — O26859 Spotting complicating pregnancy, unspecified trimester: Secondary | ICD-10-CM

## 2023-09-13 NOTE — Progress Notes (Signed)
   RETURN GYNECOLOGY VISIT  Subjective:  Ashley Watson is a 33 y.o. G1P1001 with LMP 08/09/23 presenting for vaginal bleeding in early pregnancy  Patient had faint positive pregnancy tests 4 days ago and then started bleeding the day after her positive pregnancy test.  Bleeding became progressively heavier over the next few days and was associated with cramping, but now her bleeding/cramping has slowed down significantly.    bHCG 23 on 09/11/2023  Hx notable for endometriosis s/p left salpingectomy, slight lysis of adhesions and excision of endometriosis with Dr. April Manson in 2021  Objective:   Vitals:   09/13/23 0921  BP: 133/77  Pulse: 80  Resp: 16  Weight: 140 lb (63.5 kg)  Height: 5\' 5"  (1.651 m)   General:  Alert, oriented and cooperative. Patient is in no acute distress.  Skin: Skin is warm and dry. No rash noted.   Cardiovascular: Normal heart rate noted  Respiratory: Normal respiratory effort, no problems with respiration noted  Abdomen: Soft, non-tender, non-distended     Assessment and Plan:  Ashley Watson is a 33 y.o. with the following  1. Spotting in pregnancy (Primary) Reviewed most likely diagnosis is early miscarriage/chemical pregnancy but that we will get another bHCG to look at the trend and get pelvic ultrasound to confirm no evidence of ectopic pregnancy.  Reviewed that ultrasound will most likely show nothing in the uterus and nothing in the adnexa. If the bHCG<5, confirms a diagnosis and no further testing is needed If bHCG > 5, will continue to trend until negative - Beta hCG quant (ref lab) - US OB LESS THAN 14 WEEKS WITH OB TRANSVAGINAL; Future  Return for pending lab & ultrasound results.  Lennart Pall, MD

## 2023-09-13 NOTE — Patient Instructions (Signed)
It was nice meeting you today! I suspect you had a chemical pregnancy or early miscarriage.   If your pregnancy hormone (bhcg) level is less than 5, you will not need any more testing.   If the hormone level is above 5, we will move forward with the ultrasound and get another hormone level on Monday to make sure it is going down as we expect

## 2023-09-14 ENCOUNTER — Encounter: Payer: Self-pay | Admitting: Obstetrics and Gynecology

## 2023-09-14 LAB — BETA HCG QUANT (REF LAB): hCG Quant: 17 m[IU]/mL

## 2023-09-17 ENCOUNTER — Other Ambulatory Visit: Payer: No Typology Code available for payment source

## 2023-09-18 LAB — PROTEIN / CREATININE RATIO, URINE
Creatinine, Urine: 101.8 mg/dL
Protein, Ur: 5 mg/dL
Protein/Creat Ratio: 49 mg/g{creat} (ref 0–200)

## 2023-10-02 ENCOUNTER — Encounter: Payer: Self-pay | Admitting: Obstetrics and Gynecology

## 2023-10-02 ENCOUNTER — Ambulatory Visit: Admitting: Obstetrics and Gynecology

## 2023-10-02 ENCOUNTER — Other Ambulatory Visit (HOSPITAL_COMMUNITY)
Admission: RE | Admit: 2023-10-02 | Discharge: 2023-10-02 | Disposition: A | Discharge status: Home / Self Care | Source: Ambulatory Visit | Attending: Obstetrics and Gynecology | Admitting: Obstetrics and Gynecology

## 2023-10-02 VITALS — BP 146/82 | HR 92 | Ht 65.0 in | Wt 142.0 lb

## 2023-10-02 DIAGNOSIS — Z113 Encounter for screening for infections with a predominantly sexual mode of transmission: Secondary | ICD-10-CM | POA: Diagnosis present

## 2023-10-02 DIAGNOSIS — B3731 Acute candidiasis of vulva and vagina: Secondary | ICD-10-CM | POA: Diagnosis present

## 2023-10-02 MED ORDER — FLUCONAZOLE 150 MG PO TABS: 150.0000 mg | ORAL_TABLET | Freq: Every day | ORAL | 0 refills | Status: DC

## 2023-10-02 NOTE — Progress Notes (Signed)
    GYNECOLOGY ANNUAL PREVENTATIVE CARE ENCOUNTER NOTE  History:     Ashley Watson is a 33 y.o. G23P1011 female here for concerns about vaginal itching that began over the weekend. She took monistat one day on Sunday and symptoms improved. Requests full STD evaluation today.    Gynecologic History Patient's last menstrual period was 09/09/2023.  OB History  Gravida Para Term Preterm AB Living  2 1 1  0 1 1  SAB IAB Ectopic Multiple Live Births  1 0 0 0     # Outcome Date GA Lbr Len/2nd Weight Sex Type Anes PTL Lv  2 SAB           1 Term             Past Medical History:  Diagnosis Date   Dysmenorrhea    Migraines    Type 2 diabetes mellitus (HCC)     Past Surgical History:  Procedure Laterality Date   LAPAROSCOPIC UNILATERAL SALPINGECTOMY Left 07/08/2020   Procedure: LAPARSCOPIC left salpingectomy, chromotubation,  lysis of adhesions, and electrosurgical excision and vaporization of ovarian and peritoneal endometriotic lesions;  Surgeon: Fermin Schwab, MD;  Location: [facility];  Service: Gynecology;  Laterality: Left;   NO PAST SURGERIES     TOOTH EXTRACTION  2016    No current outpatient medications on file prior to visit.   No current facility-administered medications on file prior to visit.    Allergies  Allergen Reactions   Latex Rash    Social History:  reports that she has never smoked. She has never used smokeless tobacco. She reports that she does not currently use alcohol after a past usage of about 1.0 standard drink of alcohol per week. She reports that she does not currently use drugs after having used the following drugs: Marijuana. Frequency: 7.00 times per week.  Family History  Problem Relation Age of Onset   Diabetes Father    Hypertension Father    Gout Father     The following portions of the patient's history were reviewed and updated as appropriate: allergies, current medications, past family history, past medical  history, past social history, past surgical history and problem list.  Review of Systems Pertinent items noted in HPI and remainder of comprehensive ROS otherwise negative.  Physical Exam:  BP (!) 146/82   Pulse 92   Ht 5\' 5"  (1.651 m)   Wt 142 lb (64.4 kg)   LMP 09/09/2023   BMI 23.63 kg/m  CONSTITUTIONAL: Well-developed, well-nourished female in no acute distress.  HENT:  Normocephalic SKIN: Skin is warm and dry.  PELVIC: Normal appearing external genitalia and urethral meatus; yeast noticed on vaginal walls.  Performed in the presence of a chaperone.  Assessment and Plan:  1. Screening examination for STD (sexually transmitted disease) (Primary)  - Cervicovaginal ancillary only( [place]) - HIV antibody (with reflex) - Hepatitis C Antibody - Hepatitis B Surface AntiGEN - RPR  2. Vaginal yeast infection  - Cervicovaginal ancillary only( [place]) - RX diflucan if symptoms return      Ashley Watson, Ashley Rutherford, NP [employer] for [employer], [facility]

## 2023-10-03 LAB — RPR: RPR Ser Ql: NONREACTIVE

## 2023-10-03 LAB — CERVICOVAGINAL ANCILLARY ONLY
Bacterial Vaginitis (gardnerella): NEGATIVE
Candida Glabrata: NEGATIVE
Candida Vaginitis: POSITIVE — AB
Chlamydia: NEGATIVE
Comment: NEGATIVE
Comment: NEGATIVE
Comment: NEGATIVE
Comment: NEGATIVE
Comment: NEGATIVE
Comment: NORMAL
Neisseria Gonorrhea: NEGATIVE
Trichomonas: NEGATIVE

## 2023-10-03 LAB — HIV ANTIBODY (ROUTINE TESTING W REFLEX): HIV Screen 4th Generation wRfx: NONREACTIVE

## 2023-10-03 LAB — HEPATITIS C ANTIBODY: Hep C Virus Ab: NONREACTIVE

## 2023-10-03 LAB — HEPATITIS B SURFACE ANTIGEN: Hepatitis B Surface Ag: NEGATIVE

## 2023-11-07 ENCOUNTER — Telehealth: Payer: Self-pay | Admitting: *Deleted

## 2023-11-07 NOTE — Telephone Encounter (Signed)
 Returned call from 11/06/2023 at 3:07 PM. Patient not able to schedule at this time or complete the call due to having to take a work call. Patient stated that she will call back.

## 2023-11-29 ENCOUNTER — Ambulatory Visit: Admitting: *Deleted

## 2023-11-29 VITALS — BP 124/84 | HR 89 | Ht 64.0 in | Wt 147.0 lb

## 2023-11-29 DIAGNOSIS — Z3201 Encounter for pregnancy test, result positive: Secondary | ICD-10-CM | POA: Diagnosis not present

## 2023-11-29 LAB — POCT URINE PREGNANCY: Preg Test, Ur: POSITIVE — AB

## 2023-11-29 NOTE — Progress Notes (Signed)
 Pt tested for pregnancy-positive. LMP-10/07/23.

## 2023-12-20 ENCOUNTER — Ambulatory Visit

## 2023-12-20 ENCOUNTER — Other Ambulatory Visit

## 2023-12-20 ENCOUNTER — Other Ambulatory Visit: Payer: Self-pay

## 2023-12-20 DIAGNOSIS — Z3201 Encounter for pregnancy test, result positive: Secondary | ICD-10-CM

## 2023-12-20 DIAGNOSIS — Z3491 Encounter for supervision of normal pregnancy, unspecified, first trimester: Secondary | ICD-10-CM | POA: Diagnosis not present

## 2023-12-20 DIAGNOSIS — Z3A10 10 weeks gestation of pregnancy: Secondary | ICD-10-CM | POA: Diagnosis not present

## 2023-12-25 ENCOUNTER — Telehealth: Payer: Self-pay | Admitting: *Deleted

## 2023-12-25 NOTE — Telephone Encounter (Signed)
 Patient advised of how to submit FMLA forms.

## 2023-12-27 ENCOUNTER — Encounter: Payer: Self-pay | Admitting: Obstetrics and Gynecology

## 2023-12-28 ENCOUNTER — Ambulatory Visit: Admitting: Sports Medicine

## 2023-12-31 ENCOUNTER — Encounter: Payer: Self-pay | Admitting: Certified Nurse Midwife

## 2024-01-03 NOTE — Progress Notes (Unsigned)
 History:    Ashley Watson is a 33 y.o. G3P1011 at Unknown by early ultrasound being seen today for her first obstetrical visit.  Her obstetrical history is significant for FGR, Preeclamsia, GDM and Parvo infection in previous pregnancy. Patient does intend to breast feed. Pregnancy history fully reviewed.  Has history of Parvo, FGR, and PEC.  Reports she has postiive perception of her labor and birth.   Patient reports occasional round ligament pain and headache.      HISTORY: OB History  Gravida Para Term Preterm AB Living  3 1 1  0 1 1  SAB IAB Ectopic Multiple Live Births  1 0 0 0 0    # Outcome Date GA Lbr Len/2nd Weight Sex Type Anes PTL Lv  3 Current           2 SAB           1 Term             Last pap smear was done 2023 and was normal  Past Medical History:  Diagnosis Date   Dysmenorrhea    Migraines    Type 2 diabetes mellitus (HCC)    Past Surgical History:  Procedure Laterality Date   LAPAROSCOPIC UNILATERAL SALPINGECTOMY Left 07/08/2020   Procedure: LAPARSCOPIC left salpingectomy, chromotubation,  lysis of adhesions, and electrosurgical excision and vaporization of ovarian and peritoneal endometriotic lesions;  Surgeon: Yalcinkaya, Tamer, MD;  Location: [facility];  Service: Gynecology;  Laterality: Left;   NO PAST SURGERIES     TOOTH EXTRACTION  2016   Family History  Problem Relation Age of Onset   Diabetes Father    Hypertension Father    Gout Father    Migraines Father    Seizures Maternal Grandfather    Social History   Tobacco Use   Smoking status: Never   Smokeless tobacco: Never  Vaping Use   Vaping status: Never Used  Substance Use Topics   Alcohol use: Not Currently    Alcohol/week: 1.0 standard drink of alcohol    Types: 1 Glasses of wine per week    Comment: occ x 1 week wine   Drug use: Not Currently    Frequency: 7.0 times per week    Types: Marijuana    Comment: none since preg   Allergies  Allergen  Reactions   Latex Rash   No current outpatient medications on file prior to visit.   No current facility-administered medications on file prior to visit.    Review of Systems Pertinent items noted in HPI and remainder of comprehensive ROS otherwise negative. Physical Exam:   Vitals:   01/04/24 0815  BP: 108/69  Pulse: 82  Weight: 146 lb (66.2 kg)   Fetal Heart Rate (bpm): 150  Constitutional: Well-developed, well-nourished pregnant female in no acute distress.  HEENT: PERRLA Skin: normal color and turgor, no rash Cardiovascular: normal rate & rhythm, warm and well perfused Respiratory: normal effort, no problems with respiration noted GI: Abd soft, non-distended MS: Extremities nontender, no edema, normal ROM Neurologic: Alert and oriented x 4.  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 Supervision of other normal pregnancy, antepartum (Primary) - Not yet perceieving regular fetal movement. - Occasional RLP, supportive measures discussed. - Occasional HA, prevention discussed and Reglan  prescribed for as-needed use..    - Initial labs drawn. - Continue prenatal vitamins. - Problem list reviewed and updated. - Genetic Screening discussed, First trimester screen, Quad screen, and NIPS: ordered. - Ultrasound discussed; fetal anatomic survey:  requested. - Anticipatory guidance about prenatal visits given including labs, ultrasounds, and testing. - Discussed usage of Babyscripts and virtual visits as additional source of managing and completing prenatal visits in midst of coronavirus and pandemic.   - Encouraged to complete MyChart Registration for her ability to review results, send requests, and have questions addressed.  - The nature of [facility]/Faculty Practice with multiple MDs and Advanced Practice Providers was explained to patient; also emphasized that residents, students are part of our team. - Routine obstetric precautions reviewed. Encouraged to seek out care at office or emergency room [facility] preferred) for urgent and/or emergent concerns.  Return in about 4 weeks (around 02/01/2024) for LOB.    Future Appointments  Date Time Provider Department Center  01/31/2024  8:10 AM Izell Marsh, MD [facility]  02/21/2024  8:00 AM [facility] PROVIDER 1 [facility]  02/21/2024  8:30 AM [facility] US5 [facility]  02/28/2024  9:30 AM Lacey Pian, MD [facility]     Raford Bunk, MSN, CNM, RNC-OB Certified Nurse Midwife, [facility] 01/04/2024 11:57 AM

## 2024-01-04 ENCOUNTER — Ambulatory Visit (INDEPENDENT_AMBULATORY_CARE_PROVIDER_SITE_OTHER): Admitting: Certified Nurse Midwife

## 2024-01-04 ENCOUNTER — Encounter: Payer: Self-pay | Admitting: Certified Nurse Midwife

## 2024-01-04 ENCOUNTER — Other Ambulatory Visit (HOSPITAL_COMMUNITY)
Admission: RE | Admit: 2024-01-04 | Discharge: 2024-01-04 | Disposition: A | Discharge status: Home / Self Care | Source: Ambulatory Visit | Attending: Certified Nurse Midwife | Admitting: Certified Nurse Midwife

## 2024-01-04 VITALS — BP 108/69 | HR 82 | Wt 146.0 lb

## 2024-01-04 DIAGNOSIS — Z3A12 12 weeks gestation of pregnancy: Secondary | ICD-10-CM

## 2024-01-04 DIAGNOSIS — O0990 Supervision of high risk pregnancy, unspecified, unspecified trimester: Secondary | ICD-10-CM

## 2024-01-04 DIAGNOSIS — G8929 Other chronic pain: Secondary | ICD-10-CM

## 2024-01-04 DIAGNOSIS — R519 Headache, unspecified: Secondary | ICD-10-CM

## 2024-01-04 DIAGNOSIS — O0991 Supervision of high risk pregnancy, unspecified, first trimester: Secondary | ICD-10-CM | POA: Diagnosis not present

## 2024-01-04 DIAGNOSIS — Z3480 Encounter for supervision of other normal pregnancy, unspecified trimester: Secondary | ICD-10-CM

## 2024-01-04 HISTORY — DX: Supervision of high risk pregnancy, unspecified, unspecified trimester: O09.90

## 2024-01-04 MED ORDER — METOCLOPRAMIDE HCL 10 MG PO TABS: 10.0000 mg | ORAL_TABLET | Freq: Four times a day (QID) | ORAL | 0 refills | Status: DC

## 2024-01-04 NOTE — Patient Instructions (Addendum)
 For prevention of migraines in pregnancy: -Magnesium  glycinate, 400mg  by mouth, once daily -Vitamin B2, 400mg  by mouth, once daily  For treatment of migraines in pregnancy: -take medication at the first sign of the pain of a headache, or the first sign of your aura -start with 1000mg  Tylenol  (do not exceed 4000mg  of Tylenol  in 24hrs), with or without Reglan  10mg  -if no relief after 1-2hours, can take Flexeril  10mg  -if headache is severe and not relieved by the above, may take Fioricet , 1 tablet, no more than 3 days per month -Fioricet  should only be used as a rescue medication, when absolutely necessary -if the above regimen does not resolve your headache at all, please come to MAU for additional treatment -if you take Fioricet , please be aware that this has Tylenol  in it and will contribute to the 4,000mg  of Tylenol  that you are allowed to take per day   We highly recommend childbirth education to help you plan for labor and begin practicing coping skills (which will be needed with or without pain meds).  [place] Childbirth Education Options: Sign up by visiting [email or web address]  Childbirth ~ Self-Paced eClass (English and Spanish) This online class offers you the freedom to complete a childbirth education series in the comfort of your own home at your own pace.  Childbirth Class (In-Person 4-Week Series  or on Saturdays, Virtual 4-Week Series ~ [place]) This interactive in-person class series will help you and your partner prepare for your birth experience. Topics include: Labor & Birth, Comfort Measures, Breathing Techniques, Massage, Medical Interventions, Pain Management Options, Cesarean Birth, Postpartum Care, and Newborn Care  Comfort Techniques for Labor ~ In-Person Class [facility]) This interactive class is designed for parents-to-be who want to learn & practice hands-on skills to help relieve some of the discomfort of labor and encourage their babies to rotate toward  the best position for birth. Moms and their partners will be able to try a variety of labor positions with birth balls and rebozos as well as practice breathing, relaxation, and visualization techniques.  Natural Childbirth Class (In-Person 5-Week Series, In-Person on Saturdays or Virtual 5-Week Series ~ [place]) This class series is designed for expectant parents who want to learn and practice natural methods of coping with the process of labor and childbirth.  Cesarean Birth Self-Paced eClass (English and Spanish) This online course provides comprehensive information you can trust as you prepare for a possible cesarean birth. In this class, you'll learn how to make your birth and recovery comfortable and joyful through instructive video clips, animations, and activities.  Waterbirth ~ Airline pilot Interested in a waterbirth? In addition to a consultation with your credentialed waterbirth provider, this free, informational online class will help you discover whether waterbirth is the right fit for you. Not all obstetrical practices offer waterbirth, so check with your healthcare provider.  Tour Probation officer) - [facility] [employer] our 4 minute video tour of [facility] located in [place].   [place] Parenting Education Options:  Pregnancy 101 (Virtual) Congratulations on your pregnancy! This class is geared toward moms in their first trimester, but everyone is welcome. We are excited to guide you through all aspects of supporting a healthy pregnancy. You will learn what to expect at routine prenatal care appointments, common postpartum adjustments, basic infant safety, and breastfeeding.  Successful Partnering & Parenting ~ In-Person Workshop [facility]) This workshop inspires and equips partners of all economic levels, ages, and cultures to confidently care for  their infants, support the birthing persons, and navigate their  own transformations into new partners and parents. Learning activities are geared towards supporting partner, but moms are welcome to attend.  'Baby & Me' Parenting Group (Virtual on Wednesdays at 11am) Enjoy this time discussing newborn & infant parenting topics and family adjustment issues with other new parents in a relaxed environment. Each week brings a new speaker or baby-centered activity. This group offers support and connection to parents as they journey through the adjustments and struggles of that sometimes overwhelming first year after the birth of a child.  Baby Safety, CPR, & Choking Class ~ Virtual This life-saving information is meant to encourage parents as they learn important safety and prevention tips as well as infant CPR and relief of choking.  Breastfeeding Class (In-Person in [place] or [employer]) Families learn what to expect in the first days and weeks of breastfeeding your newborn.   Breastfeeding Self-Paced eClass (English & Spanish) Families learn what to expect in the first days and weeks of breastfeeding your newborn.  Caring for Baby ~ In-Person, Virtual or Self-Paced Class This in-person class is for both expectant and adoptive parents who want to learn and practice the most up-to-date newborn care for their babies. Focus is on birth through the first six weeks of life.  CPR & Choking Relief for Infants & Children ~ In-Person Class [facility]) This in-person course is designed for any parent, expectant parent, or adult who cares for infants or children. Participants learn and demonstrate cardiopulmonary resuscitation and choking relief procedures for both infants and children.  Grandparent Love ~ In-Person Class Grandparents will learn the most updated infant care and safety recommendations. They will discover ways to support their own children during the transition into the parenting role and receive tips on communicating with the new parents.  [place]  Parenting Support Group Options:  Bereavement Grief Support Group (Pregnancy/Infant Loss) - Virtual This is an ongoing experience that meets once a month and is designed to help you honor the past, assist you in discovering tools to strengthen you today, and aid you in developing hope for the future.  Breastfeeding & Pumping Support Group (In-Person on Thursdays at 12pm or Virtual on Tuesdays at 5pm) Join us  in-person each Thursday starting June 1st, 2023 at 12pm! This support group is free for all families looking for breastfeeding and/or pumping support.   Community-Based Childbirth Education Options:  [facility] Department Classes:  Childbirth education classes can help you get ready for a positive parenting experience. You can also meet other expectant parents and get free stuff for your baby. Each class runs for five weeks on the same night and costs $45 for the mother-to-be and her support person. Medicaid covers the cost if you are eligible. Call [phone] to register.  [employer] offers a variety of programs for the [employer] and is another great way to get connected. Please go to [email or web address] for more information.  Childbirth With A Twist! Be informed of your options, get educated on birth, understand what your body is doing, learn how to cope, and have a lot of fun and laughs all while doing it either from the comfort of your couch OR in our cozy office and classroom space near the [place] airport. If you are taking a virtual class, then class is taught LIVE, so you can ask questions and receive answers in real-time from an experienced doula and childbirth educator.  This virtual childbirth education class will  meet for five instruction times online.  Although we are based in [place], [place], this virtual class is open to anyone in the world. Please visit: [email or web address] for more  information.  Books We Love: The Doula Guide to Childbirth by Arvin Laundry and Alisa App The First-Time Parent's Childbirth Handbook by Dr. Andy Keen, CNM The Birth Partner by Coleen Dauer

## 2024-01-05 LAB — CBC/D/PLT+RPR+RH+ABO+RUBIGG...
Antibody Screen: NEGATIVE
Basophils Absolute: 0 10*3/uL (ref 0.0–0.2)
Basos: 0 %
EOS (ABSOLUTE): 0 10*3/uL (ref 0.0–0.4)
Eos: 0 %
HCV Ab: NONREACTIVE
HIV Screen 4th Generation wRfx: NONREACTIVE
Hematocrit: 39.3 % (ref 34.0–46.6)
Hemoglobin: 12.7 g/dL (ref 11.1–15.9)
Hepatitis B Surface Ag: NEGATIVE
Immature Grans (Abs): 0 10*3/uL (ref 0.0–0.1)
Immature Granulocytes: 0 %
Lymphocytes Absolute: 2.2 10*3/uL (ref 0.7–3.1)
Lymphs: 21 %
MCH: 29.3 pg (ref 26.6–33.0)
MCHC: 32.3 g/dL (ref 31.5–35.7)
MCV: 91 fL (ref 79–97)
Monocytes Absolute: 0.5 10*3/uL (ref 0.1–0.9)
Monocytes: 5 %
Neutrophils Absolute: 7.4 10*3/uL — ABNORMAL HIGH (ref 1.4–7.0)
Neutrophils: 74 %
Platelets: 217 10*3/uL (ref 150–450)
RBC: 4.33 x10E6/uL (ref 3.77–5.28)
RDW: 12.8 % (ref 11.7–15.4)
RPR Ser Ql: NONREACTIVE
Rh Factor: POSITIVE
Rubella Antibodies, IGG: 4.91 {index} (ref >0.99)
WBC: 10.1 10*3/uL (ref 3.4–10.8)

## 2024-01-05 LAB — HEMOGLOBIN A1C
Est. average glucose Bld gHb Est-mCnc: 114 mg/dL
Hgb A1c MFr Bld: 5.6 % (ref 4.8–5.6)

## 2024-01-05 LAB — HCV INTERPRETATION

## 2024-01-06 LAB — URINE CULTURE, OB REFLEX

## 2024-01-06 LAB — CULTURE, OB URINE

## 2024-01-07 ENCOUNTER — Ambulatory Visit: Payer: Self-pay | Admitting: Certified Nurse Midwife

## 2024-01-07 LAB — CERVICOVAGINAL ANCILLARY ONLY
Chlamydia: NEGATIVE
Comment: NEGATIVE
Comment: NORMAL
Neisseria Gonorrhea: NEGATIVE

## 2024-01-21 ENCOUNTER — Telehealth: Payer: Self-pay

## 2024-01-21 NOTE — Telephone Encounter (Signed)
 RN received fax from [employer] missing on [address] and [employer]. RN spoke with Jennell who reported that [facility] was previously completed with prior pregnancy, Panorama test lab tubes showing 6/6 and unable to run with that date. RN spoke with patient who reported blood tubes drawn on 6/14, however does not want to know gender or want the additional testing at this time. RN reported would let [employer] know to cancel both orders as does not need.  Silvano LELON Piano, RN

## 2024-01-22 LAB — PANORAMA PRENATAL TEST FULL PANEL:PANORAMA TEST PLUS 5 ADDITIONAL MICRODELETIONS

## 2024-01-22 LAB — HORIZON CUSTOM

## 2024-01-31 ENCOUNTER — Encounter: Payer: Self-pay | Admitting: Obstetrics and Gynecology

## 2024-01-31 ENCOUNTER — Ambulatory Visit (INDEPENDENT_AMBULATORY_CARE_PROVIDER_SITE_OTHER): Admitting: Obstetrics and Gynecology

## 2024-01-31 ENCOUNTER — Encounter: Payer: Self-pay | Admitting: *Deleted

## 2024-01-31 VITALS — BP 118/70 | HR 89 | Wt 152.0 lb

## 2024-01-31 DIAGNOSIS — Z8632 Personal history of gestational diabetes: Secondary | ICD-10-CM | POA: Diagnosis not present

## 2024-01-31 DIAGNOSIS — O0990 Supervision of high risk pregnancy, unspecified, unspecified trimester: Secondary | ICD-10-CM

## 2024-01-31 DIAGNOSIS — Z8759 Personal history of other complications of pregnancy, childbirth and the puerperium: Secondary | ICD-10-CM | POA: Diagnosis not present

## 2024-01-31 DIAGNOSIS — Z3A16 16 weeks gestation of pregnancy: Secondary | ICD-10-CM | POA: Diagnosis not present

## 2024-01-31 DIAGNOSIS — R0681 Apnea, not elsewhere classified: Secondary | ICD-10-CM

## 2024-01-31 HISTORY — DX: Personal history of other complications of pregnancy, childbirth and the puerperium: Z87.59

## 2024-01-31 MED ORDER — ASPIRIN 81 MG PO TBEC: 162.0000 mg | DELAYED_RELEASE_TABLET | Freq: Every evening | ORAL | 3 refills | Status: DC

## 2024-01-31 NOTE — Progress Notes (Signed)
   PRENATAL VISIT NOTE  Subjective:  Ashley Watson is a 33 y.o. G3P1011 at [redacted]w[redacted]d being seen today for ongoing prenatal care.  She is currently monitored for the following issues for this high-risk pregnancy and has Nevus; History of severe pre-eclampsia; History of gestational diabetes; Supervision of high risk pregnancy, antepartum; History of prior pregnancy with IUGR newborn; and History of vacuum extraction assisted delivery on their problem list.  Patient reports doing well overall. Notes more pressure/round ligament pain. Nausea is better. Notes breathlessness earlier than she expected. Seems to be random and lasts 10 seconds or so. Is still able to do her usual activities, stairs etc. One unrelated episode of chest pain that resolved spontaneously. Sometimes has palpitations at night but this was happening before pregnancy..  Contractions: Not present. Vag. Bleeding: None.  Movement: Present. Denies leaking of fluid.   The following portions of the patient's history were reviewed and updated as appropriate: allergies, current medications, past family history, past medical history, past social history, past surgical history and problem list.   Objective:   Vitals:   01/31/24 0810  BP: 118/70  Pulse: 89  Weight: 152 lb (68.9 kg)   Fetal Status: Fetal Heart Rate (bpm): 143   Movement: Present     General:  Alert, oriented and cooperative. Patient is in no acute distress.  Skin: Skin is warm and dry. No rash noted.   Cardiovascular: Normal heart rate noted  Respiratory: Normal respiratory effort, no problems with respiration noted  Abdomen: Soft, gravid, appropriate for gestational age.  Pain/Pressure: Present      Assessment and Plan:  Pregnancy: G3P1011 at [redacted]w[redacted]d 1. Supervision of high risk pregnancy, antepartum (Primary) 2. [redacted] weeks gestation of pregnancy -NIPS unable to be run due an issue with the sample, can recollect but she is not sure if she is going to  get it done -AFP  discussed, she accepts since we are doing other blood work today -Anatomy scheduled 7/25 - AFP, Serum, Open Spina Bifida - PANORAMA PRENATAL TEST  3. History of severe pre-eclampsia Normotensive today, baseline labs ordered ldASA recommended, rx for 182mg  at bedtime sent - Comp Met (CMET) - Protein / creatinine ratio, urine  4. History of gestational diabetes Early A1c 5.6   5. History of prior pregnancy with IUGR newborn Anticipate serial growth US   6. History of vacuum extraction assisted delivery Outlet for NRFHT  7. Breathlessness, palpitations Occasional and not associated with each other TSH, CMP today Will log symptoms for review next visit; based on symptom review, can consider zio patch/cardiac eval - TSH Rfx on Abnormal to Free T4  Please refer to After Visit Summary for other counseling recommendations.   Return in about 4 weeks (around 02/28/2024) for return OB at 20 weeks.  Future Appointments  Date Time Provider Department Center  02/22/2024  9:00 AM [facility] PROVIDER 1 [facility]  02/22/2024  9:30 AM [facility] US4 [facility]  02/28/2024  9:30 AM Cleatus Moccasin, MD [facility]  03/27/2024  9:10 AM Constant, Winton, MD [facility]    Kieth JAYSON Carolin, MD

## 2024-01-31 NOTE — Patient Instructions (Signed)
 It was nice to see you today! Congrats on your pregnancy. Your results should be back within 1 week.

## 2024-02-02 LAB — PROTEIN / CREATININE RATIO, URINE
Creatinine, Urine: 77.1 mg/dL
Protein, Ur: 11.9 mg/dL
Protein/Creat Ratio: 154 mg/g{creat} (ref 0–200)

## 2024-02-03 LAB — AFP, SERUM, OPEN SPINA BIFIDA
AFP MoM: 1.03
AFP Value: 35 ng/mL
Gest. Age on Collection Date: 16.3 wk
Maternal Age At EDD: 33.2 a
OSBR Risk 1 IN: 10000
Test Results:: NEGATIVE
Weight: 152 [lb_av]

## 2024-02-03 LAB — COMPREHENSIVE METABOLIC PANEL WITH GFR
ALT: 16 IU/L (ref 0–32)
AST: 19 IU/L (ref 0–40)
Albumin: 4 g/dL (ref 3.9–4.9)
Alkaline Phosphatase: 87 IU/L (ref 44–121)
BUN/Creatinine Ratio: 15 (ref 9–23)
BUN: 10 mg/dL (ref 6–20)
Bilirubin Total: 0.4 mg/dL (ref 0.0–1.2)
CO2: 21 mmol/L (ref 20–29)
Calcium: 8.7 mg/dL (ref 8.7–10.2)
Chloride: 101 mmol/L (ref 96–106)
Creatinine, Ser: 0.66 mg/dL (ref 0.57–1.00)
Globulin, Total: 2.7 g/dL (ref 1.5–4.5)
Glucose: 69 mg/dL — ABNORMAL LOW (ref 70–99)
Potassium: 4.1 mmol/L (ref 3.5–5.2)
Sodium: 135 mmol/L (ref 134–144)
Total Protein: 6.7 g/dL (ref 6.0–8.5)
eGFR: 119 mL/min/1.73 (ref >59)

## 2024-02-03 LAB — TSH RFX ON ABNORMAL TO FREE T4: TSH: 1.26 u[IU]/mL (ref 0.450–4.500)

## 2024-02-04 ENCOUNTER — Ambulatory Visit: Payer: Self-pay | Admitting: Obstetrics and Gynecology

## 2024-02-04 DIAGNOSIS — O0990 Supervision of high risk pregnancy, unspecified, unspecified trimester: Secondary | ICD-10-CM

## 2024-02-04 DIAGNOSIS — Z8759 Personal history of other complications of pregnancy, childbirth and the puerperium: Secondary | ICD-10-CM

## 2024-02-16 LAB — PANORAMA PRENATAL TEST FULL PANEL:PANORAMA TEST PLUS 5 ADDITIONAL MICRODELETIONS: FETAL FRACTION: 5.2

## 2024-02-21 ENCOUNTER — Ambulatory Visit

## 2024-02-21 ENCOUNTER — Other Ambulatory Visit

## 2024-02-22 ENCOUNTER — Other Ambulatory Visit: Payer: Self-pay | Admitting: *Deleted

## 2024-02-22 ENCOUNTER — Ambulatory Visit

## 2024-02-22 ENCOUNTER — Ambulatory Visit: Attending: Certified Nurse Midwife | Admitting: Obstetrics and Gynecology

## 2024-02-22 VITALS — BP 119/60 | HR 90

## 2024-02-22 DIAGNOSIS — Z3A19 19 weeks gestation of pregnancy: Secondary | ICD-10-CM | POA: Insufficient documentation

## 2024-02-22 DIAGNOSIS — Z8759 Personal history of other complications of pregnancy, childbirth and the puerperium: Secondary | ICD-10-CM | POA: Diagnosis not present

## 2024-02-22 DIAGNOSIS — O43192 Other malformation of placenta, second trimester: Secondary | ICD-10-CM | POA: Insufficient documentation

## 2024-02-22 DIAGNOSIS — O0990 Supervision of high risk pregnancy, unspecified, unspecified trimester: Secondary | ICD-10-CM

## 2024-02-22 DIAGNOSIS — Z8632 Personal history of gestational diabetes: Secondary | ICD-10-CM | POA: Diagnosis not present

## 2024-02-22 DIAGNOSIS — O09292 Supervision of pregnancy with other poor reproductive or obstetric history, second trimester: Secondary | ICD-10-CM | POA: Diagnosis present

## 2024-02-22 NOTE — Progress Notes (Addendum)
 Maternal-Fetal Medicine Consultation Name: Ashley Watson MRN: [id number]  G3 P1011 at 19w 4d gestation. Patient is here for fetal anatomy scan. On cell-free fetal DNA screening, the risks of aneuploidies are not increased. MSAFP screening showed low risk for open-neural tube defects.  Obstetrical history is significant for a term vaginal delivery in 2022 and the newborn weighed 4 pounds and 3 ounces at birth.  Her pregnancy was complicated by gestational hypertension and fetal growth restriction.  Patient had gestational diabetes in that pregnancy.  She reports no chronic medical conditions including diabetes or hypertension.  Ultrasound We performed fetal anatomy scan. No makers of aneuploidies or fetal structural defects are seen. Fetal biometry is consistent with her previously-established dates. Amniotic fluid is normal and good fetal activity is seen.  Marginal cord insertion is seen.  Patient understands the limitations of ultrasound in detecting fetal anomalies.   History of gestational hypertension and growth restriction Counseled the patient that recurrence of gestational hypertension/preeclampsia is seen and 25% to 50% of pregnancies.  I discussed the benefit of low-dose aspirin  prophylaxis (aspirin  81 mg daily) that delays or prevents preeclampsia.  Patient has not started taking aspirin  but will start from today. Fetal growth restriction in the absence of gestational hypertension can recur in about 20% of pregnancies.  We will perform serial fetal growth assessments in this pregnancy. She is aware that history of gestational diabetes (GDM) increases the risk of GDM in this pregnancy.  I recommend early screening at 13- or 25-weeks' gestation.  Marginal cord insertion I explained the finding with help of ultrasound images and diagrams.  Marginal cord insertion can be associated with fetal growth restriction in some cases.  Most pregnancies complicated by marginal cord insertion have  good outcomes.  Recommendations - An appointment was made for her to return in 9 weeks for fetal growth assessment. -Recommend early screening at 27- or 25-weeks' gestation.    Consultation including face-to-face (more than 50%) counseling 30 minutes.

## 2024-02-28 ENCOUNTER — Ambulatory Visit: Admitting: Obstetrics and Gynecology

## 2024-02-28 ENCOUNTER — Encounter: Payer: Self-pay | Admitting: Obstetrics and Gynecology

## 2024-02-28 VITALS — BP 110/69 | HR 94 | Wt 160.0 lb

## 2024-02-28 DIAGNOSIS — O0990 Supervision of high risk pregnancy, unspecified, unspecified trimester: Secondary | ICD-10-CM

## 2024-02-28 DIAGNOSIS — Z8632 Personal history of gestational diabetes: Secondary | ICD-10-CM | POA: Diagnosis not present

## 2024-02-28 DIAGNOSIS — Z8759 Personal history of other complications of pregnancy, childbirth and the puerperium: Secondary | ICD-10-CM

## 2024-02-28 DIAGNOSIS — O43199 Other malformation of placenta, unspecified trimester: Secondary | ICD-10-CM | POA: Diagnosis not present

## 2024-02-28 DIAGNOSIS — Z3A20 20 weeks gestation of pregnancy: Secondary | ICD-10-CM

## 2024-02-28 HISTORY — DX: Other malformation of placenta, unspecified trimester: O43.199

## 2024-02-28 NOTE — Progress Notes (Signed)
   PRENATAL VISIT NOTE  Subjective:  Ashley Watson is a 33 y.o. G3P1011 at [redacted]w[redacted]d being seen today for ongoing prenatal care.  She is currently monitored for the following issues for this high-risk pregnancy and has History of severe pre-eclampsia; History of gestational diabetes; Supervision of high risk pregnancy, antepartum; History of prior pregnancy with IUGR newborn; History of vacuum extraction assisted delivery; and Marginal insertion of umbilical cord affecting management of mother on their problem list.  Patient reports general aches/pains of pregnancy.  Contractions: Not present. Vag. Bleeding: None.  Movement: Present. Denies leaking of fluid.   The following portions of the patient's history were reviewed and updated as appropriate: allergies, current medications, past family history, past medical history, past social history, past surgical history and problem list.   Objective:    Vitals:   02/28/24 0941  BP: 110/69  Pulse: 94  Weight: 160 lb (72.6 kg)    Fetal Status:  Fetal Heart Rate (bpm): 144   Movement: Present    General: Alert, oriented and cooperative. Patient is in no acute distress.  Skin: Skin is warm and dry. No rash noted.   Cardiovascular: Normal heart rate noted  Respiratory: Normal respiratory effort, no problems with respiration noted  Abdomen: Soft, gravid, appropriate for gestational age.  Pain/Pressure: Absent     Pelvic: Cervical exam deferred        Extremities: Normal range of motion.  Edema: None  Mental Status: Normal mood and affect. Normal behavior. Normal judgment and thought content.   Assessment and Plan:  Pregnancy: G3P1011 at [redacted]w[redacted]d 1. Supervision of high risk pregnancy, antepartum (Primary) Anatomy US  wnl and marginal cord noted.  AFP wnl.   2. History of prior pregnancy with IUGR newborn Will monitor growth starting at 28w. No FGR on anatomy.   3. History of severe pre-eclampsia She is on ldASA Baseline labs wnl  4.  History of gestational diabetes Early A1C 5.6.  Discussed 2hr at 28w  5. Marginal insertion of umbilical cord affecting management of mother Will monitor growth at 28w due to FGR history anyway. Discussed what marginal cord insertion is.   6. Pregnancy with 20 completed weeks gestation   Preterm labor symptoms and general obstetric precautions including but not limited to vaginal bleeding, contractions, leaking of fluid and fetal movement were reviewed in detail with the patient. Please refer to After Visit Summary for other counseling recommendations.   Return in about 4 weeks (around 03/27/2024) for OB VISIT, MD or APP.  Future Appointments  Date Time Provider Department Center  03/27/2024  9:10 AM Alger, Winton, MD [facility]  04/25/2024  9:00 AM [facility] PROVIDER 1 [facility]  04/25/2024  9:30 AM [facility] US3 [facility]    Vina Solian, MD

## 2024-03-27 ENCOUNTER — Encounter: Payer: Self-pay | Admitting: Obstetrics and Gynecology

## 2024-03-27 ENCOUNTER — Ambulatory Visit: Admitting: Obstetrics and Gynecology

## 2024-03-27 VITALS — BP 114/79 | HR 106 | Wt 167.0 lb

## 2024-03-27 DIAGNOSIS — Z3A24 24 weeks gestation of pregnancy: Secondary | ICD-10-CM

## 2024-03-27 DIAGNOSIS — Z8759 Personal history of other complications of pregnancy, childbirth and the puerperium: Secondary | ICD-10-CM | POA: Diagnosis not present

## 2024-03-27 DIAGNOSIS — Z8632 Personal history of gestational diabetes: Secondary | ICD-10-CM

## 2024-03-27 DIAGNOSIS — O0990 Supervision of high risk pregnancy, unspecified, unspecified trimester: Secondary | ICD-10-CM

## 2024-03-27 DIAGNOSIS — O43199 Other malformation of placenta, unspecified trimester: Secondary | ICD-10-CM

## 2024-03-27 NOTE — Progress Notes (Signed)
   PRENATAL VISIT NOTE  Subjective:  Ashley Watson is a 33 y.o. G3P1011 at [redacted]w[redacted]d being seen today for ongoing prenatal care.  She is currently monitored for the following issues for this high-risk pregnancy and has History of severe pre-eclampsia; History of gestational diabetes; Supervision of high risk pregnancy, antepartum; History of prior pregnancy with IUGR newborn; History of vacuum extraction assisted delivery; and Marginal insertion of umbilical cord affecting management of mother on their problem list.  Patient reports no complaints.  Contractions: Irritability. Vag. Bleeding: None.  Movement: Present. Denies leaking of fluid.   The following portions of the patient's history were reviewed and updated as appropriate: allergies, current medications, past family history, past medical history, past social history, past surgical history and problem list.   Objective:    Vitals:   03/27/24 0854  BP: 114/79  Pulse: (!) 106  Weight: 167 lb (75.8 kg)    Fetal Status:  Fetal Heart Rate (bpm): 149 Fundal Height: 24 cm Movement: Present    General: Alert, oriented and cooperative. Patient is in no acute distress.  Skin: Skin is warm and dry. No rash noted.   Cardiovascular: Normal heart rate noted  Respiratory: Normal respiratory effort, no problems with respiration noted  Abdomen: Soft, gravid, appropriate for gestational age.  Pain/Pressure: Present     Pelvic: Cervical exam deferred        Extremities: Normal range of motion.  Edema: None  Mental Status: Normal mood and affect. Normal behavior. Normal judgment and thought content.   Assessment and Plan:  Pregnancy: G3P1011 at [redacted]w[redacted]d 1. Supervision of high risk pregnancy, antepartum (Primary) Patient is doing well Patient due for glucola next visit but is opting to monitor her CBGs fasting and postprandial for 2 weeks. Patient advised to start at 26 weeks and to bring 2-week log at next visit. Patient does not want to fast for  glucola testing Third trimester labs next visiti  2. Marginal insertion of umbilical cord affecting management of mother Follow up growth on 9/26  3. History of severe pre-eclampsia Continue ASA  4. History of prior pregnancy with IUGR newborn EFW 39%tile on 7/25 Follow up growth ultrasound   5. History of gestational diabetes See above  Preterm labor symptoms and general obstetric precautions including but not limited to vaginal bleeding, contractions, leaking of fluid and fetal movement were reviewed in detail with the patient. Please refer to After Visit Summary for other counseling recommendations.   Return in about 4 weeks (around 04/24/2024) for in person, ROB, High risk.  Future Appointments  Date Time Provider Department Center  03/27/2024  9:10 AM Brayen Bunn, Winton, MD [facility]  04/25/2024  9:00 AM [facility] PROVIDER 1 [facility]  04/25/2024  9:30 AM [facility] US3 [facility]    Winton Felt, MD

## 2024-04-01 ENCOUNTER — Encounter: Payer: Self-pay | Admitting: Sports Medicine

## 2024-04-23 NOTE — Progress Notes (Unsigned)
   PRENATAL VISIT NOTE  Subjective:  Ashley Watson is a 33 y.o. G3P1011 at [redacted]w[redacted]d being seen today for ongoing prenatal care.  She is currently monitored for the following issues for this high-risk pregnancy and has History of severe pre-eclampsia; History of gestational diabetes; Supervision of high risk pregnancy, antepartum; History of prior pregnancy with IUGR newborn; History of vacuum extraction assisted delivery; and Marginal insertion of umbilical cord affecting management of mother on their problem list.  Patient reports {[id number]}.   .  .   . Denies leaking of fluid.   The following portions of the patient's history were reviewed and updated as appropriate: allergies, current medications, past family history, past medical history, past social history, past surgical history and problem list.   Objective:    There were no vitals filed for this visit.  Fetal Status:           General: Alert, oriented and cooperative. Patient is in no acute distress.  Skin: Skin is warm and dry. No rash noted.   Cardiovascular: Normal heart rate noted  Respiratory: Normal respiratory effort, no problems with respiration noted  Abdomen: Soft, gravid, appropriate for gestational age.        Pelvic: Cervical exam deferred        Extremities: Normal range of motion.     Mental Status: Normal mood and affect. Normal behavior. Normal judgment and thought content.   Assessment and Plan:  Pregnancy: G3P1011 at [redacted]w[redacted]d 1. Supervision of high risk pregnancy, antepartum (Primary) Opted for CBGs for 2 weeks. Log reviewed and ***.  Discussed Tdap and flu shot. Pt ***.   2. Pregnancy with 28 completed weeks gestation   3. Marginal insertion of umbilical cord affecting management of mother Growth on 9/26.   4. History of vacuum extraction assisted delivery For fetal indication  5. History of severe pre-eclampsia Continue ASA  6. History of prior pregnancy with IUGR newborn EFW 39%tile on 7/25    7. History of gestational diabetes See above.   Preterm labor symptoms and general obstetric precautions including but not limited to vaginal bleeding, contractions, leaking of fluid and fetal movement were reviewed in detail with the patient. Please refer to After Visit Summary for other counseling recommendations.   No follow-ups on file.  Future Appointments  Date Time Provider Department Center  04/24/2024  9:00 AM [facility] NURSE [facility]  04/24/2024  9:30 AM Cleatus Moccasin, MD [facility]  04/25/2024  9:00 AM [facility] PROVIDER 1 [facility]  04/25/2024  9:30 AM [facility] US3 [facility]    Moccasin Cleatus, MD

## 2024-04-24 ENCOUNTER — Other Ambulatory Visit

## 2024-04-24 ENCOUNTER — Ambulatory Visit: Admitting: Obstetrics and Gynecology

## 2024-04-24 VITALS — BP 99/67 | HR 96 | Wt 174.0 lb

## 2024-04-24 DIAGNOSIS — O43199 Other malformation of placenta, unspecified trimester: Secondary | ICD-10-CM

## 2024-04-24 DIAGNOSIS — Z3A28 28 weeks gestation of pregnancy: Secondary | ICD-10-CM | POA: Diagnosis not present

## 2024-04-24 DIAGNOSIS — O0990 Supervision of high risk pregnancy, unspecified, unspecified trimester: Secondary | ICD-10-CM

## 2024-04-24 DIAGNOSIS — Z8759 Personal history of other complications of pregnancy, childbirth and the puerperium: Secondary | ICD-10-CM

## 2024-04-24 DIAGNOSIS — Z8632 Personal history of gestational diabetes: Secondary | ICD-10-CM

## 2024-04-25 ENCOUNTER — Ambulatory Visit

## 2024-04-25 ENCOUNTER — Other Ambulatory Visit: Payer: Self-pay | Admitting: *Deleted

## 2024-04-25 ENCOUNTER — Ambulatory Visit: Attending: Obstetrics and Gynecology | Admitting: Obstetrics

## 2024-04-25 VITALS — BP 102/64 | HR 92

## 2024-04-25 DIAGNOSIS — Z362 Encounter for other antenatal screening follow-up: Secondary | ICD-10-CM | POA: Insufficient documentation

## 2024-04-25 DIAGNOSIS — O09293 Supervision of pregnancy with other poor reproductive or obstetric history, third trimester: Secondary | ICD-10-CM

## 2024-04-25 DIAGNOSIS — O43193 Other malformation of placenta, third trimester: Secondary | ICD-10-CM | POA: Insufficient documentation

## 2024-04-25 DIAGNOSIS — Z8759 Personal history of other complications of pregnancy, childbirth and the puerperium: Secondary | ICD-10-CM

## 2024-04-25 DIAGNOSIS — O43199 Other malformation of placenta, unspecified trimester: Secondary | ICD-10-CM | POA: Diagnosis not present

## 2024-04-25 DIAGNOSIS — O24410 Gestational diabetes mellitus in pregnancy, diet controlled: Secondary | ICD-10-CM | POA: Diagnosis not present

## 2024-04-25 DIAGNOSIS — Z3A28 28 weeks gestation of pregnancy: Secondary | ICD-10-CM | POA: Diagnosis not present

## 2024-04-25 DIAGNOSIS — O24419 Gestational diabetes mellitus in pregnancy, unspecified control: Secondary | ICD-10-CM | POA: Insufficient documentation

## 2024-04-25 LAB — GLUCOSE TOLERANCE, 2 HOURS W/ 1HR
Glucose, 1 hour: 205 mg/dL — ABNORMAL HIGH (ref 70–179)
Glucose, 2 hour: 234 mg/dL — ABNORMAL HIGH (ref 70–152)
Glucose, Fasting: 119 mg/dL — ABNORMAL HIGH (ref 70–91)

## 2024-04-25 LAB — CBC
Hematocrit: 36.5 % (ref 34.0–46.6)
Hemoglobin: 11.9 g/dL (ref 11.1–15.9)
MCH: 29.2 pg (ref 26.6–33.0)
MCHC: 32.6 g/dL (ref 31.5–35.7)
MCV: 90 fL (ref 79–97)
Platelets: 189 x10E3/uL (ref 150–450)
RBC: 4.08 x10E6/uL (ref 3.77–5.28)
RDW: 12.8 % (ref 11.7–15.4)
WBC: 13.9 x10E3/uL — ABNORMAL HIGH (ref 3.4–10.8)

## 2024-04-25 LAB — HIV ANTIBODY (ROUTINE TESTING W REFLEX): HIV Screen 4th Generation wRfx: NONREACTIVE

## 2024-04-25 LAB — RPR: RPR Ser Ql: NONREACTIVE

## 2024-04-25 NOTE — Progress Notes (Signed)
 MFM Consult Note  Ashley Watson is currently at 28 weeks and 4 days.  She has been followed due to a history of IUGR and gestational diabetes in her prior pregnancy.  The patient's 2-hour glucose tolerance test just came back this morning.  The results from all 3 hours were elevated, indicating that she has gestational diabetes again in her current pregnancy.  Sonographic findings Single intrauterine pregnancy at 28w 4d.  Fetal cardiac activity:  Observed and appears normal. Presentation: Transverse, head to maternal right. Interval fetal anatomy appears normal. Fetal biometry shows the estimated fetal weight of 2 pounds 10 ounces which measures at the 26th percentile. Amniotic fluid volume: Within normal limits. MVP: 5.6 cm. Placenta: Posterior.  Gestational diabetes The patient was informed of the results of her 2-hour test for gestational diabetes. She was advised that once she receives diabetic teaching and receives her testing supplies, she should monitor her fingersticks 4 times daily (fasting and 2 hours after each meal).   She was advised that our goals for her fingerstick values are fasting values of 90-95 or less and two-hour postprandial values of 120 or less.   Should the majority (greater than 50%) of her fingerstick results be above these values, she may have to be started on insulin  or metformin to help her achieve better glycemic control.    Due to gestational diabetes, we will continue to follow her with monthly growth ultrasounds.   Weekly fetal testing should be started at 32 weeks should she require insulin  or metformin for treatment. The patient was advised that delivery for well-controlled diabetes in pregnancy is usually recommended at around 39 weeks.   Delivery at 37 weeks may be considered should her glycemic control be poor.  She will return in 4 weeks for another ultrasound exam.    The patient stated that all of her questions were answered today.  A total  of 20 minutes was spent counseling and coordinating the care for this patient.  Greater than 50% of the time was spent in direct face-to-face contact.

## 2024-04-28 ENCOUNTER — Ambulatory Visit: Payer: Self-pay | Admitting: Obstetrics and Gynecology

## 2024-04-28 DIAGNOSIS — O24419 Gestational diabetes mellitus in pregnancy, unspecified control: Secondary | ICD-10-CM

## 2024-04-28 DIAGNOSIS — O0990 Supervision of high risk pregnancy, unspecified, unspecified trimester: Secondary | ICD-10-CM

## 2024-04-28 MED ORDER — ACCU-CHEK GUIDE W/DEVICE KIT: 1.0000 | PACK | Freq: Four times a day (QID) | 0 refills | Status: DC

## 2024-04-28 MED ORDER — ACCU-CHEK SOFTCLIX LANCETS MISC: 1.0000 | Freq: Four times a day (QID) | 3 refills | Status: DC

## 2024-04-28 MED ORDER — GLUCOSE BLOOD VI STRP: ORAL_STRIP | 3 refills | Status: DC

## 2024-05-09 ENCOUNTER — Telehealth: Admitting: Obstetrics and Gynecology

## 2024-05-09 VITALS — BP 120/80 | HR 80 | Wt 177.0 lb

## 2024-05-09 DIAGNOSIS — Z3A30 30 weeks gestation of pregnancy: Secondary | ICD-10-CM | POA: Diagnosis not present

## 2024-05-09 DIAGNOSIS — O0993 Supervision of high risk pregnancy, unspecified, third trimester: Secondary | ICD-10-CM

## 2024-05-09 DIAGNOSIS — O24419 Gestational diabetes mellitus in pregnancy, unspecified control: Secondary | ICD-10-CM | POA: Diagnosis not present

## 2024-05-09 DIAGNOSIS — O0990 Supervision of high risk pregnancy, unspecified, unspecified trimester: Secondary | ICD-10-CM

## 2024-05-09 MED ORDER — INSULIN GLARGINE 100 UNIT/ML SOLOSTAR PEN: 10.0000 [IU] | PEN_INJECTOR | Freq: Two times a day (BID) | SUBCUTANEOUS | 1 refills | Status: DC

## 2024-05-09 MED ORDER — ASPIRIN 81 MG PO TBEC: 162.0000 mg | DELAYED_RELEASE_TABLET | Freq: Every evening | ORAL | 3 refills | Status: DC

## 2024-05-09 MED ORDER — INSULIN PEN NEEDLE 31G X 5 MM MISC: 1.0000 | Freq: Two times a day (BID) | 1 refills | Status: DC

## 2024-05-09 NOTE — Progress Notes (Signed)
 TELEHEALTH OBSTETRICS VISIT ENCOUNTER NOTE  Provider location: Center for [facility] [place]   Patient location: Home  I connected with Ashley Watson on 05/09/24 at  8:10 AM EDT by telephone at home and verified that I am speaking with the correct person using two identifiers. Of note, unable to do video encounter due to technical difficulties.    I discussed the limitations, risks, security and privacy concerns of performing an evaluation and management service by telephone and the availability of in person appointments. I also discussed with the patient that there may be a patient responsible charge related to this service. The patient expressed understanding and agreed to proceed.  Subjective:  Ashley Watson is a 33 y.o. G3P1011 at [redacted]w[redacted]d being followed for ongoing prenatal care.  She is currently monitored for the following issues for this high-risk pregnancy and has History of severe pre-eclampsia; Gestational diabetes; Supervision of high risk pregnancy, antepartum; History of prior pregnancy with IUGR newborn; History of vacuum extraction assisted delivery; and Marginal insertion of umbilical cord affecting management of mother on their problem list.  Patient reports no complaints. Reports fetal movement. Denies any contractions, bleeding or leaking of fluid.   The following portions of the patient's history were reviewed and updated as appropriate: allergies, current medications, past family history, past medical history, past social history, past surgical history and problem list.   Objective:  Blood pressure 120/80, pulse 80, weight 177 lb (80.3 kg), last menstrual period 10/07/2023. General:  Alert, oriented and cooperative.   Mental Status: Normal mood and affect perceived. Normal judgment and thought content.  Rest of physical exam deferred due to type of encounter  Assessment and Plan:  Pregnancy: G3P1011 at [redacted]w[redacted]d  1. Gestational diabetes mellitus  (GDM), antepartum, gestational diabetes method of control unspecified (Primary)  Profoundly failed traditional 2 hour GTT BS log reviewed today. Fasting BS all greater than 120 2 hour post meals >150  Start Lantus 10 units qAM and 10 units qPM. Increase by 4 units every 3-4 days to get fasting BS less than 95. Low threshold to initiate meal coverage insulin  if BS remain elevated post meals Follow up in 1 week to review BS log Offered Dexcom, patient declined.   2. Supervision of high risk pregnancy, antepartum  Doing well +fetal movement.     Preterm labor symptoms and general obstetric precautions including but not limited to vaginal bleeding, contractions, leaking of fluid and fetal movement were reviewed in detail with the patient.  I discussed the assessment and treatment plan with the patient. The patient was provided an opportunity to ask questions and all were answered. The patient agreed with the plan and demonstrated an understanding of the instructions. The patient was advised to call back or seek an in-person office evaluation/go to [facility] at [facility] for any urgent or concerning symptoms. Please refer to After Visit Summary for other counseling recommendations.   I provided 10 minutes of non-face-to-face time during this encounter.  No follow-ups on file.  Future Appointments  Date Time Provider Department Center  05/23/2024  9:50 AM Regino Camie DELENA EDDY [facility]  05/26/2024  8:15 AM [facility] PROVIDER 1 [facility]  05/26/2024  8:30 AM [facility] US2 [facility]  06/05/2024  8:10 AM Cleatus Moccasin, MD [facility]  06/20/2024  8:10 AM Algernon Mundie, Delon FERNS, NP [facility]  06/25/2024  8:10 AM Erik Kieth BROCKS, MD [facility]    Delon Emms, NP Center for [facility]  Healthcare, [facility]

## 2024-05-12 ENCOUNTER — Encounter: Payer: Self-pay | Admitting: Obstetrics and Gynecology

## 2024-05-12 ENCOUNTER — Telehealth: Payer: Self-pay

## 2024-05-12 DIAGNOSIS — O24419 Gestational diabetes mellitus in pregnancy, unspecified control: Secondary | ICD-10-CM

## 2024-05-12 MED ORDER — INSULIN GLARGINE 100 UNIT/ML SOLOSTAR PEN: 10.0000 [IU] | PEN_INJECTOR | Freq: Two times a day (BID) | SUBCUTANEOUS | 1 refills | Status: DC

## 2024-05-12 NOTE — Telephone Encounter (Addendum)
 RN received message from provider to call pharmacy as patient reported issue with picking up Lantus/ Jaquin reported did not have prescription for Lantus. RN reported it was sent over 10/10 for the pen needle and Aspirin . RN requested to speak with pharmacist/ Mel. RN notified provider.  Silvano LELON Piano, RN  2nd note, RN spoke with pharmacy technician/Natalie @ 2:45pm who reported did receive prescription of Lantus, generic and brand not in stock, will be ordered and maybe be in by tomorrow. They reported will notify patient when ready for pick up.  Silvano LELON Piano, RN

## 2024-05-13 ENCOUNTER — Telehealth: Payer: Self-pay

## 2024-05-13 NOTE — Telephone Encounter (Signed)
 RN spoke with patient per request of the provider Delon Emms, NP. Patient reported plans to pick up insulin  this evening after work, will continue with logging blood sugars. RN informed patient to let provider know how blood sugars are after starting insulin  and to keep regularly scheduled appointments.   Silvano LELON Piano, RN

## 2024-05-23 ENCOUNTER — Telehealth (INDEPENDENT_AMBULATORY_CARE_PROVIDER_SITE_OTHER): Admitting: Certified Nurse Midwife

## 2024-05-23 VITALS — BP 130/86 | HR 121 | Wt 180.0 lb

## 2024-05-23 DIAGNOSIS — O0990 Supervision of high risk pregnancy, unspecified, unspecified trimester: Secondary | ICD-10-CM

## 2024-05-23 DIAGNOSIS — O43199 Other malformation of placenta, unspecified trimester: Secondary | ICD-10-CM

## 2024-05-23 DIAGNOSIS — Z8759 Personal history of other complications of pregnancy, childbirth and the puerperium: Secondary | ICD-10-CM

## 2024-05-23 DIAGNOSIS — Z3A32 32 weeks gestation of pregnancy: Secondary | ICD-10-CM

## 2024-05-23 DIAGNOSIS — O24419 Gestational diabetes mellitus in pregnancy, unspecified control: Secondary | ICD-10-CM | POA: Diagnosis not present

## 2024-05-23 NOTE — Progress Notes (Signed)
 I connected with Ashley Watson 05/23/24 at  9:50 AM EDT by: MyChart video and verified that I am speaking with the correct person using two identifiers.  Patient is located at home and provider is located at [facility].     I discussed the limitations, risks, security and privacy concerns of performing an evaluation and management service by MyChart video and the availability of in person appointments. I also discussed with the patient that there may be a patient responsible charge related to this service. By engaging in this virtual visit, you consent to the provision of healthcare.  Additionally, you authorize for your insurance to be billed for the services provided during this visit.  The patient expressed understanding and agreed to proceed.  The following staff members participated in the virtual visit:  Camie Rote, CNM    PRENATAL VISIT NOTE  Subjective:  Ashley Watson is a 33 y.o. G3P1011 at [redacted]w[redacted]d  for virtual visit for ongoing prenatal care.  She is currently monitored for the following issues for this high-risk pregnancy and has History of severe pre-eclampsia; Gestational diabetes; Supervision of high risk pregnancy, antepartum; History of prior pregnancy with IUGR newborn; History of vacuum extraction assisted delivery; and Marginal insertion of umbilical cord affecting management of mother on their problem list.  Patient reports no complaints.  Contractions: Irritability. Vag. Bleeding: None.  Movement: Present. Denies leaking of fluid.   The following portions of the patient's history were reviewed and updated as appropriate: allergies, current medications, past family history, past medical history, past social history, past surgical history and problem list.   Objective:   Vitals:   05/23/24 1021  BP: 130/86  Pulse: (!) 121  Weight: 180 lb (81.6 kg)   Self-Obtained  Fetal Status:     Movement: Present     Assessment and Plan:  Pregnancy: G3P1011 at  [redacted]w[redacted]d 1. Supervision of high risk pregnancy, antepartum (Primary) - Doing well, feeling regular and vigorous fetal movement  2. [redacted] weeks gestation of pregnancy - Routine PNC, anticipatory guidance.   3. Gestational diabetes mellitus (GDM), antepartum, gestational diabetes method of control unspecified - Reviewed CBGs in MyChart, with noted better control now that insulin   ordered.  - Has follow up with MFM.   4. Marginal insertion of umbilical cord affecting management of mother [place] by MFM  5. History of vacuum extraction assisted delivery 6. History of severe pre-eclampsia   Preterm labor symptoms and general obstetric precautions including but not limited to vaginal bleeding, contractions, leaking of fluid and fetal movement were reviewed in detail with the patient.  No follow-ups on file.  Future Appointments  Date Time Provider Department Center  05/26/2024  8:15 AM [facility] PROVIDER 1 [facility]  05/26/2024  8:30 AM [facility] US2 [facility]  06/05/2024  8:10 AM Cleatus Moccasin, MD [facility]  06/20/2024  8:10 AM Rasch, Delon FERNS, NP [facility]  06/25/2024  8:10 AM Erik Kieth BROCKS, MD [facility]     Time spent on virtual visit: 10 minutes  Camie DELENA Rote, CNM

## 2024-05-26 ENCOUNTER — Ambulatory Visit: Attending: Obstetrics | Admitting: Maternal & Fetal Medicine

## 2024-05-26 ENCOUNTER — Ambulatory Visit (HOSPITAL_BASED_OUTPATIENT_CLINIC_OR_DEPARTMENT_OTHER)

## 2024-05-26 ENCOUNTER — Other Ambulatory Visit: Payer: Self-pay | Admitting: *Deleted

## 2024-05-26 ENCOUNTER — Other Ambulatory Visit: Payer: Self-pay | Admitting: Obstetrics

## 2024-05-26 VITALS — BP 147/68 | HR 97

## 2024-05-26 DIAGNOSIS — O09293 Supervision of pregnancy with other poor reproductive or obstetric history, third trimester: Secondary | ICD-10-CM | POA: Diagnosis not present

## 2024-05-26 DIAGNOSIS — Z3A33 33 weeks gestation of pregnancy: Secondary | ICD-10-CM | POA: Diagnosis not present

## 2024-05-26 DIAGNOSIS — O43193 Other malformation of placenta, third trimester: Secondary | ICD-10-CM

## 2024-05-26 DIAGNOSIS — O24414 Gestational diabetes mellitus in pregnancy, insulin controlled: Secondary | ICD-10-CM | POA: Insufficient documentation

## 2024-05-26 DIAGNOSIS — O43199 Other malformation of placenta, unspecified trimester: Secondary | ICD-10-CM

## 2024-05-26 DIAGNOSIS — Z3689 Encounter for other specified antenatal screening: Secondary | ICD-10-CM | POA: Insufficient documentation

## 2024-05-26 DIAGNOSIS — Z8759 Personal history of other complications of pregnancy, childbirth and the puerperium: Secondary | ICD-10-CM

## 2024-05-26 DIAGNOSIS — Z362 Encounter for other antenatal screening follow-up: Secondary | ICD-10-CM | POA: Diagnosis not present

## 2024-05-26 DIAGNOSIS — O24410 Gestational diabetes mellitus in pregnancy, diet controlled: Secondary | ICD-10-CM

## 2024-05-26 NOTE — Progress Notes (Signed)
 Patient information  Patient Name: Ashley Watson  Patient MRN:   [id number]  Referring practice: MFM Referring Provider: [facility]  Problem List   Patient Active Problem List   Diagnosis Date Noted   Marginal insertion of umbilical cord affecting management of mother 02/28/2024   History of prior pregnancy with IUGR newborn 01/31/2024   History of vacuum extraction assisted delivery 01/31/2024   Supervision of high risk pregnancy, antepartum 01/04/2024   Gestational diabetes 07/22/2021   History of severe pre-eclampsia 06/22/2021   Maternal Fetal medicine Consult  Ashley Watson (Kat) is a 33 y.o. G3P1011 at [redacted]w[redacted]d here for ultrasound and consultation. Ashley Watson is doing well today with no acute concerns. Today we focused on the following:   GDMA2: Patient reports that her blood sugars are well-controlled on her current regimen.  Fasting blood sugars are mostly less than 95.  Her 2 hours are between 102 and 150.  She is compliant with her insulin .  I discussed that her ultrasound also shows signs of good control based on normal fluid levels and normal fetal growth.   History of preeclampsia: The patient's blood pressure was borderline today.  The first read was 147/68.  The repeat was 130/89.  Since the repeat was not elevated above the threshold to qualify for gestational hypertension, she will not be diagnosed with elevated blood pressure today.  She understands the signs and symptoms of preeclampsia and the need to call her OB provider if her blood pressure is elevated at home.  She has an at home blood pressure monitoring kit.  The patient had time to ask questions that were answered to her satisfaction.  She verbalized understanding and agrees to proceed with the plan below.  Sonographic findings Single intrauterine pregnancy. Fetal cardiac activity: Observed. Presentation: Cephalic. Interval fetal anatomy appears normal. Fetal biometry  shows the estimated fetal weight at the 32 percentile. Amniotic fluid: Within normal limits.  MVP: 4.25 cm. Placenta: Posterior. BPP: 8/8.   There are limitations of prenatal ultrasound such as the inability to detect certain abnormalities due to poor visualization. Various factors such as fetal position, gestational age and maternal body habitus may increase the difficulty in visualizing the fetal anatomy.    Recommendations - Continue weekly antenatal testing with growth ultrasounds every 4 weeks. - Delivery timing likely around 39 weeks as long as her blood pressure stays in the normal range.  If she develops a hypertensive disorder of pregnancy we will modify her delivery timing depending on her diagnosis.  Review of Systems: A review of systems was performed and was negative except per HPI   Vitals and Physical Exam    05/26/2024    8:44 AM 05/26/2024    8:06 AM 05/23/2024   10:21 AM  Vitals with BMI  Weight   180 lbs  Systolic 130 147 869  Diastolic 89 68 86  Pulse  97 121    Sitting comfortably on the sonogram table Nonlabored breathing Normal rate and rhythm Abdomen is nontender  Past pregnancies OB History  Gravida Para Term Preterm AB Living  3 1 1  0 1 1  SAB IAB Ectopic Multiple Live Births  1 0 0 0 1    # Outcome Date GA Lbr Len/2nd Weight Sex Type Anes PTL Lv  3 Current           2 SAB 09/2023          1 Term 06/24/21 [redacted]w[redacted]d  4  lb 3 oz (1.9 kg)  Vag-Vacuum   LIV     I spent 30 minutes reviewing the patients chart, including labs and images as well as counseling the patient about her medical conditions. Greater than 50% of the time was spent in direct face-to-face patient counseling.  Delora Smaller  MFM, [facility]   05/26/2024  8:48 AM

## 2024-05-30 ENCOUNTER — Telehealth (INDEPENDENT_AMBULATORY_CARE_PROVIDER_SITE_OTHER): Admitting: Obstetrics and Gynecology

## 2024-05-30 DIAGNOSIS — O24414 Gestational diabetes mellitus in pregnancy, insulin controlled: Secondary | ICD-10-CM

## 2024-05-30 DIAGNOSIS — Z3A33 33 weeks gestation of pregnancy: Secondary | ICD-10-CM

## 2024-05-30 MED ORDER — METFORMIN HCL 500 MG PO TABS: 500.0000 mg | ORAL_TABLET | Freq: Every day | ORAL | 1 refills | Status: DC

## 2024-05-30 MED ORDER — GLUCOSE BLOOD VI STRP: ORAL_STRIP | 12 refills | Status: DC

## 2024-05-30 MED ORDER — INSULIN GLARGINE 100 UNIT/ML SOLOSTAR PEN: 32.0000 [IU] | PEN_INJECTOR | Freq: Every day | SUBCUTANEOUS | 1 refills | Status: DC

## 2024-05-30 NOTE — Progress Notes (Signed)
 TELEHEALTH OBSTETRICS VISIT ENCOUNTER NOTE  FOR DIABETES MANAGEMENT DURING PREGNANCY    Provider location: [facility] for [facility]   Patient location: Home  I connected withNAME@ on 05/30/24 at 10:00 AM EDT by telephone at home and verified that I am speaking with the correct person using two identifiers. Of note, unable to do video encounter due to technical difficulties.    I discussed the limitations, risks, security and privacy concerns of performing an evaluation and management service by telephone and the availability of in person appointments. I also discussed with the patient that there may be a patient responsible charge related to this service. The patient expressed understanding and agreed to proceed.   History:   Ashley Watson is a 33 y.o. G3P1011 at [redacted]w[redacted]d by LMP being seen today for diabetes management during pregnancy.   Patient reports no complaints. Reports fetal movement. Denies any contractions, bleeding or leaking of fluid.   The following portions of the patient's history were reviewed and updated as appropriate: allergies, current medications, past family history, past medical history, past social history, past surgical history and problem list.   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   Past Surgical History:  Procedure Laterality Date   LAPAROSCOPIC UNILATERAL SALPINGECTOMY Left 07/08/2020   Procedure: LAPARSCOPIC left salpingectomy, chromotubation,  lysis of adhesions, and electrosurgical excision and vaporization of ovarian and peritoneal endometriotic lesions;  Surgeon: Yalcinkaya, Tamer, MD;  Location: [facility];  Service: Gynecology;  Laterality: Left;   TOOTH EXTRACTION   2016   Family History  Problem Relation Age of Onset   Diabetes Father    Hypertension Father    Gout Father    Migraines Father    Seizures Maternal Grandfather    Social History   Tobacco Use   Smoking status: Never   Smokeless tobacco: Never  Vaping Use   Vaping status: Never Used  Substance Use Topics   Alcohol use: Not Currently    Alcohol/week: 1.0 standard drink of alcohol    Types: 1 Glasses of wine per week    Comment: occ x 1 week wine   Drug use: Not Currently    Frequency: 7.0 times per week    Types: Marijuana    Comment: none since preg   Allergies  Allergen Reactions   Latex Rash   Current Outpatient Medications on File Prior to Visit  Medication Sig Dispense Refill   Accu-Chek Softclix Lancets lancets 1 each by Other route 4 (four) times daily. Please check Blood Sugar 4x daily 200 each 3   aspirin  EC 81 MG tablet Take 2 tablets (162 mg total) by mouth at bedtime. Swallow whole. 180 tablet 3   Blood Glucose Monitoring Suppl (ACCU-CHEK GUIDE) w/Device KIT 1 Device by Does not apply route 4 (four) times daily. 1 kit 0   glucose blood test strip Please check Blood Sugar 4x daily 200 each 3   insulin  glargine (LANTUS) 100 UNIT/ML Solostar Pen Inject 10 Units into the skin 2 (two) times daily. Take 10 Units qAM and 10 units qPM. (Patient taking differently: Inject 10 Units into the skin 2 (two) times daily. Take 10 Units qAM and 10 units qPM. Also taking 14 units every  few days) 6 mL 1   Insulin  Pen Needle 31G X 5 MM MISC 1 Needle by Does not apply route 2 (two) times daily. (Patient not taking: Reported on 05/23/2024) 100 each 1   metoCLOPramide  (REGLAN ) 10 MG tablet Take 1 tablet (10 mg total) by mouth 4 (four) times daily. (Patient not taking: Reported on 04/24/2024) 90 tablet 0   Prenatal Vit-Fe Fumarate-FA (PRENATAL PO) Take 1 tablet by mouth daily.     No current facility-administered medications on file prior to visit.   Objective:   General:  Alert,  oriented and cooperative.   Mental Status: Normal mood and affect perceived. Normal judgment and thought content.   The Rest of physical exam deferred due to type of encounter    Diabetes Type:  GDM  MFM US  05/26/24: EFW 32%tile   BP today 137/82- continue to monitor BP.    Blood sugar monitoring    Checking BS at home 4 x per day and keeping a log.    Fasting BS very labile 80-110   2 hour PP also labile 120-150's, mostly elevated post lunch and dinner.      Current Diabetes medications:  Lantus 16 units qPM Lantus 14 units qAM  Plan:   -Continue Lantus 14 units qAM -Increase Lantus to 18 units qPM -Initiate 500 mg Metformin qAM to target day time BS -Discussed likelihood of Humalog with meals if post-meals do not improve.    -Discussed only 30-40 grams of carbohydrates with each meal, with majority of your meals being protein and high fiber vegetables.    -Start a fiber supplement everyday.    -Protein snack before bed. Recommend 10-20 grams of protein before bed.    -Anticipate weekly diabetes visits until BS's are better controlled -Anticipate frequent in person OB visits along with MFM care.  -q4 week growth US  starting at 20w with antenatal weekly testing starting at 32 weeks or sooner if needed.    -Continue BASA 81 mg      This visit is for the purposes of diabetes management only. Please keep scheduled OB visits with OB team for prenatal care.    Preterm labor symptoms and general obstetric precautions including but not limited to vaginal bleeding, contractions, leaking of fluid and fetal movement were reviewed in detail with the patient.  I discussed the assessment and treatment plan with the patient. The patient was provided an opportunity to ask questions and all were answered. The patient agreed with the plan and demonstrated an understanding of the instructions. The patient was advised to call back or seek an in-person office evaluation/go to [facility]U at  [facility] for any urgent or concerning symptoms. Please refer to After Visit Summary for other counseling recommendations.    I provided 15 minutes of non-face-to-face time during this encounter.   Azlee Monforte, Delon FERNS, NP [facility] [employer], [facility]

## 2024-05-30 NOTE — Addendum Note (Signed)
 Addended by: DORITA NEST I on: 05/30/2024 12:11 PM   Modules accepted: Orders

## 2024-06-02 ENCOUNTER — Ambulatory Visit: Admitting: *Deleted

## 2024-06-02 ENCOUNTER — Ambulatory Visit: Attending: Maternal & Fetal Medicine | Admitting: *Deleted

## 2024-06-02 DIAGNOSIS — Z3A34 34 weeks gestation of pregnancy: Secondary | ICD-10-CM | POA: Diagnosis not present

## 2024-06-02 DIAGNOSIS — O24414 Gestational diabetes mellitus in pregnancy, insulin controlled: Secondary | ICD-10-CM | POA: Insufficient documentation

## 2024-06-02 NOTE — Procedures (Signed)
 Ashley Watson July 18, 1991 [redacted]w[redacted]d  Fetus A Non-Stress Test Interpretation for 06/02/24 (NST only)  Indication: Gestational Diabetes medication controlled/Insulin   Fetal Heart Rate A Mode: External Baseline Rate (A): 130 bpm Variability: Moderate Accelerations: 15 x 15 Decelerations: None Multiple birth?: No  Uterine Activity Mode: Palpation, Toco Contraction Frequency (min): None Resting Tone Palpated: Relaxed Resting Time: Adequate  Interpretation (Fetal Testing) Nonstress Test Interpretation: Reactive Comments: Dr. Ileana reviewed tracing.

## 2024-06-03 NOTE — Progress Notes (Deleted)
 OBSTETRICS PRENATAL VIRTUAL VISIT ENCOUNTER NOTE  Provider location: Center for [facility]   Patient location: Home  I connected with Comer Ashley Watson on 06/03/24 at  8:10 AM EST by MyChart Video Encounter and verified that I am speaking with the correct person using two identifiers. I discussed the limitations, risks, security and privacy concerns of performing an evaluation and management service virtually and the availability of in person appointments. I also discussed with the patient that there may be a patient responsible charge related to this service. The patient expressed understanding and agreed to proceed. Subjective:  Ashley Watson is a 33 y.o. G3P1011 at [redacted]w[redacted]d being seen today for ongoing prenatal care.  She is currently monitored for the following issues for this high-risk pregnancy and has History of severe pre-eclampsia; Gestational diabetes mellitus in pregnancy, insulin  controlled; Supervision of high risk pregnancy, antepartum; History of prior pregnancy with IUGR newborn; History of vacuum extraction assisted delivery; and Marginal insertion of umbilical cord affecting management of mother on their problem list.  Patient reports {[id number]}.   .  .   . Denies any leaking of fluid.   The following portions of the patient's history were reviewed and updated as appropriate: allergies, current medications, past family history, past medical history, past social history, past surgical history and problem list.   Objective:    There were no vitals filed for this visit.  Fetal Status:           General: Alert, oriented and cooperative. Patient is in no acute distress.  Respiratory: Normal respiratory effort, no problems with respiration noted  Mental Status: Normal mood and affect. Normal behavior. Normal judgment and thought content.  Rest of physical exam deferred due to type of encounter  Imaging: US  MFM OB FOLLOW UP Result Date:  05/26/2024 ----------------------------------------------------------------------  OBSTETRICS REPORT                       (Signed Final 05/26/2024 09:10 am) ---------------------------------------------------------------------- Patient Info  ID #:       [id number]                          D.O.B.:  Mar 23, 1991 (33 yrs)(F)  Name:       Ashley Watson               Visit Date: 05/26/2024 07:55 am ---------------------------------------------------------------------- Performed By  Attending:        Delora Smaller DO       Ref. Address:     [address]                                                             [place], [place]                                                             [address]  Performed By:     Rumaldo Sharps RDMS      [facility]  Referred By:      [facility] ---------------------------------------------------------------------- Orders  #  Description                           Code        Ordered By  1  US  MFM OB FOLLOW UP                   [id number]    YU FANG  2  US  MFM FETAL BPP WO NON               76819.01    YU FANG     STRESS ----------------------------------------------------------------------  #  Order #                     Accession #                Episode #  1  [id number]                   [id number]                 [id number]  2  [id number]                   [id number]                 [id number] ---------------------------------------------------------------------- Indications  Gestational diabetes in pregnancy, insulin      O24.414  controlled  Marginal insertion of umbilical cord affecting O43.193  management of mother in third trimester  Poor obstetric history: Previous               O09.299   preeclampsia/eclampsia/gestational HTN  Poor obstetric history: Previous fetal growth  O09.299  restriction (FGR)  Poor obstetric history: Previous gestational   O09.299  diabetes  Encounter for other antenatal screening        Z36.2  follow-up  Low risk NIPS Neg AFP  [redacted] weeks gestation of pregnancy                Z3A.33 ---------------------------------------------------------------------- Vital Signs  BP:          130/89 ---------------------------------------------------------------------- Fetal Evaluation  Num Of Fetuses:         1  Fetal Heart Rate(bpm):  125  Cardiac Activity:       Observed  Presentation:           Cephalic  Placenta:               Posterior  P. Cord Insertion:      Marg insertion previously  seen  Amniotic Fluid  AFI FV:      Within normal limits  AFI Sum(cm)     %Tile       Largest Pocket(cm)  14.62           51          4.25  RUQ(cm)       RLQ(cm)       LUQ(cm)        LLQ(cm)  3.47          3.77          3.13           4.25 ---------------------------------------------------------------------- Biophysical Evaluation  Amniotic F.V:   Within normal limits       F. Tone:        Observed  F. Movement:    Observed                   Score:          8/8  F. Breathing:   Observed ---------------------------------------------------------------------- Biometry  BPD:      84.1  mm     G. Age:  33w 6d         69  %    CI:        77.82   %    70 - 86                                                          FL/HC:      20.4   %    19.9 - 21.5  HC:      301.7  mm     G. Age:  33w 3d         25  %    HC/AC:      1.05        0.96 - 1.11  AC:      288.1  mm     G. Age:  32w 6d         46  %    FL/BPD:     73.2   %    71 - 87  FL:       61.6  mm     G. Age:  32w 0d         14  %    FL/AC:      21.4   %    20 - 24  LV:        7.7  mm  Est. FW:    2036  gm      4 lb 8 oz     32  % ---------------------------------------------------------------------- Gestational Age  U/S Today:     33w 0d                                         EDD:   07/14/24  Best:          33w 0d     Det. By:  JAYSON JONELLE CROME 1st  (12/20/23)    EDD:   07/14/24 ---------------------------------------------------------------------- Anatomy  Cranium:               Previously seen  Aortic Arch:            Previously seen  Cavum:                 Previously seen        Ductal Arch:            Previously seen  Ventricles:            Appears normal         Diaphragm:              Appears normal  Choroid Plexus:        Previously seen        Stomach:                Appears normal, left                                                                        sided  Cerebellum:            Previously seen        Abdomen:                Previously seen  Posterior Fossa:       Previously seen        Abdominal Wall:         Previously seen  Face:                  Orbits and profile     Cord Vessels:           Previously seen                         previously seen  Lips:                  Previously seen        Kidneys:                Appear normal  Thoracic:              Previously seen        Bladder:                Appears normal  Heart:                 Appears normal         Spine:                  Previously seen                         (4CH, axis, and                         situs)  RVOT:                  Previously seen        Upper Extremities:      Previously seen  LVOT:                  Previously seen        Lower Extremities:  Previously seen  Other:  Fetal anatomic survey complete on prior scans. ---------------------------------------------------------------------- Cervix Uterus Adnexa  Cervix  Not visualized (advanced GA >24wks).  Uterus  No abnormality visualized.  Right Ovary  Not visualized.  Left Ovary  Not visualized.  Cul De Sac  No free fluid seen.  Adnexa  No abnormality visualized ---------------------------------------------------------------------- Comments  Maternal Fetal medicine Consult  Hamsini D Scroggins Pauletta) is a 33 y.o. G3P1011 at  [redacted]w[redacted]d  here for ultrasound and consultation. Sabrena D  Younis is doing well today with no acute concerns. Today we  focused on the following:  GDMA2: Patient reports that her blood sugars are well-  controlled on her current regimen.  Fasting blood sugars are  mostly less than 95.  Her 2 hours are between 102 and 150.  She is compliant with her insulin .  I discussed that her  ultrasound also shows signs of good control based on normal  fluid levels and normal fetal growth.  History of preeclampsia: The patient's blood pressure was  borderline today.  The first read was 147/68.  The repeat was  130/89.  Since the repeat was not elevated above the  threshold to qualify for gestational hypertension, she will not  be diagnosed with elevated blood pressure today.  She  understands the signs and symptoms of preeclampsia and  the need to call her OB provider if her blood pressure is  elevated at home.  She has an at home blood pressure  monitoring kit.  The patient had time to ask questions that were answered to  her satisfaction.  She verbalized understanding and agrees to  proceed with the plan below.  Sonographic findings  Single intrauterine pregnancy.  Fetal cardiac activity: Observed.  Presentation: Cephalic.  Interval fetal anatomy appears normal.  Fetal biometry shows the estimated fetal weight at the 32  percentile.  Amniotic fluid: Within normal limits.  MVP: 4.25 cm.  Placenta: Posterior.  BPP: 8/8.  There are limitations of prenatal ultrasound such as the  inability to detect certain abnormalities due to poor  visualization. Various factors such as fetal position,  gestational age and maternal body habitus may increase the  difficulty in visualizing the fetal anatomy.  Recommendations  - Continue weekly antenatal testing with growth ultrasounds  every 4 weeks.  - Delivery timing likely around 39 weeks as long as her blood  pressure stays in the normal range.  If she develops a  hypertensive disorder of pregnancy we  will modify her  delivery timing depending on her diagnosis. ----------------------------------------------------------------------                   Delora Smaller, DO Electronically Signed Final Report   05/26/2024 09:10 am ----------------------------------------------------------------------   US  MFM FETAL BPP WO NON STRESS Result Date: 05/26/2024 ----------------------------------------------------------------------  OBSTETRICS REPORT                       (Signed Final 05/26/2024 09:10 am) ---------------------------------------------------------------------- Patient Info  ID #:       [id number]                          D.O.B.:  1991/02/09 (33 yrs)(F)  Name:       Grettel D Duchesne               Visit Date: 05/26/2024 07:55 am ---------------------------------------------------------------------- Performed By  Attending:        Delora Smaller DO  Ref. Address:     [address]                                                             [place], [place]                                                             [address]  Performed By:     Rumaldo Sharps RDMS      [facility]  Referred By:      [facility] ---------------------------------------------------------------------- Orders  #  Description                           Code        Ordered By  1  US  MFM OB FOLLOW UP                   76816.01    YU FANG  2  US  MFM FETAL BPP WO NON               76819.01    YU FANG     STRESS ----------------------------------------------------------------------  #  Order #                     Accession #                Episode #  1  [id number]                   [id number]                 [id number]  2  [id number]                   [id number]  [id number] ---------------------------------------------------------------------- Indications  Gestational diabetes in pregnancy, insulin      O24.414  controlled  Marginal insertion of umbilical cord affecting O43.193  management of mother in third trimester  Poor obstetric history: Previous               O09.299  preeclampsia/eclampsia/gestational HTN  Poor obstetric history: Previous fetal growth  O09.299  restriction (FGR)  Poor obstetric history: Previous gestational   O09.299  diabetes  Encounter for other antenatal screening        Z36.2  follow-up  Low risk NIPS Neg AFP  [redacted] weeks gestation of pregnancy                Z3A.33 ---------------------------------------------------------------------- Vital Signs  BP:          130/89 ---------------------------------------------------------------------- Fetal Evaluation  Num Of Fetuses:         1  Fetal Heart Rate(bpm):  125  Cardiac Activity:       Observed  Presentation:           Cephalic  Placenta:               Posterior  P. Cord Insertion:      Marg insertion previously seen  Amniotic Fluid  AFI FV:      Within normal limits  AFI Sum(cm)     %Tile       Largest Pocket(cm)  14.62           51          4.25  RUQ(cm)       RLQ(cm)       LUQ(cm)        LLQ(cm)  3.47          3.77          3.13           4.25 ---------------------------------------------------------------------- Biophysical Evaluation  Amniotic F.V:   Within normal limits       F. Tone:        Observed  F. Movement:    Observed                   Score:          8/8  F. Breathing:   Observed ---------------------------------------------------------------------- Biometry  BPD:      84.1  mm     G. Age:  33w 6d         69  %    CI:        77.82   %    70 - 86                                                          FL/HC:      20.4   %    19.9 - 21.5  HC:      301.7  mm     G. Age:  33w 3d         25  %    HC/AC:      1.05        0.96 - 1.11  AC:      288.1  mm     G. Age:  32w 6d         46  %  FL/BPD:     73.2   %    71 - 87  FL:       61.6  mm     G. Age:  32w 0d         14  %    FL/AC:      21.4   %    20 - 24  LV:        7.7  mm  Est. FW:    2036  gm      4 lb 8 oz     32  % ---------------------------------------------------------------------- Gestational Age  U/S Today:     33w 0d                                        EDD:   07/14/24  Best:          33w 0d     Det. By:  JAYSON JONELLE CROME 1st  (12/20/23)    EDD:   07/14/24 ---------------------------------------------------------------------- Anatomy  Cranium:               Previously seen        Aortic Arch:            Previously seen  Cavum:                 Previously seen        Ductal Arch:            Previously seen  Ventricles:            Appears normal         Diaphragm:              Appears normal  Choroid Plexus:        Previously seen        Stomach:                Appears normal, left                                                                        sided  Cerebellum:            Previously seen        Abdomen:                Previously seen  Posterior Fossa:       Previously seen        Abdominal Wall:         Previously seen  Face:                  Orbits and profile     Cord Vessels:           Previously seen                         previously seen  Lips:                  Previously seen        Kidneys:  Appear normal  Thoracic:              Previously seen        Bladder:                Appears normal  Heart:                 Appears normal         Spine:                  Previously seen                         (4CH, axis, and                         situs)  RVOT:                  Previously seen        Upper Extremities:      Previously seen  LVOT:                  Previously seen        Lower Extremities:      Previously seen  Other:  Fetal anatomic survey complete on prior scans. ---------------------------------------------------------------------- Cervix Uterus Adnexa  Cervix  Not visualized (advanced GA >24wks).  Uterus  No  abnormality visualized.  Right Ovary  Not visualized.  Left Ovary  Not visualized.  Cul De Sac  No free fluid seen.  Adnexa  No abnormality visualized ---------------------------------------------------------------------- Comments  Maternal Fetal medicine Consult  Kelbie D Alcott Pauletta) is a 33 y.o. G3P1011 at  [redacted]w[redacted]d here for ultrasound and consultation. Annjeanette D  Weist is doing well today with no acute concerns. Today we  focused on the following:  GDMA2: Patient reports that her blood sugars are well-  controlled on her current regimen.  Fasting blood sugars are  mostly less than 95.  Her 2 hours are between 102 and 150.  She is compliant with her insulin .  I discussed that her  ultrasound also shows signs of good control based on normal  fluid levels and normal fetal growth.  History of preeclampsia: The patient's blood pressure was  borderline today.  The first read was 147/68.  The repeat was  130/89.  Since the repeat was not elevated above the  threshold to qualify for gestational hypertension, she will not  be diagnosed with elevated blood pressure today.  She  understands the signs and symptoms of preeclampsia and  the need to call her OB provider if her blood pressure is  elevated at home.  She has an at home blood pressure  monitoring kit.  The patient had time to ask questions that were answered to  her satisfaction.  She verbalized understanding and agrees to  proceed with the plan below.  Sonographic findings  Single intrauterine pregnancy.  Fetal cardiac activity: Observed.  Presentation: Cephalic.  Interval fetal anatomy appears normal.  Fetal biometry shows the estimated fetal weight at the 32  percentile.  Amniotic fluid: Within normal limits.  MVP: 4.25 cm.  Placenta: Posterior.  BPP: 8/8.  There are limitations of prenatal ultrasound such as the  inability to detect certain abnormalities due to poor  visualization. Various factors such as fetal position,  gestational age and maternal body  habitus may increase the  difficulty in visualizing the fetal anatomy.  Recommendations  -  Continue weekly antenatal testing with growth ultrasounds  every 4 weeks.  - Delivery timing likely around 39 weeks as long as her blood  pressure stays in the normal range.  If she develops a  hypertensive disorder of pregnancy we will modify her  delivery timing depending on her diagnosis. ----------------------------------------------------------------------                   Delora Smaller, DO Electronically Signed Final Report   05/26/2024 09:10 am ----------------------------------------------------------------------    Assessment and Plan:  Pregnancy: G3P1011 at [redacted]w[redacted]d 1. Supervision of high risk pregnancy, antepartum (Primary) ***  2. Pregnancy with 34 completed weeks gestation  3. Insulin  controlled gestational diabetes mellitus (GDM) in third trimester Lantus 14/18. MTF 500 daily qAM.  10/27 growth US  was 32%ile, normal AC/AFI. Cephalic. BPP 8/8. Has Weekly NST scheduled with MFM.   4. Marginal insertion of umbilical cord affecting management of mother Growth normal.   5. History of prior pregnancy with IUGR newborn  6. History of severe pre-eclampsia BP ***   Preterm labor symptoms and general obstetric precautions including but not limited to vaginal bleeding, contractions, leaking of fluid and fetal movement were reviewed in detail with the patient. I discussed the assessment and treatment plan with the patient. The patient was provided an opportunity to ask questions and all were answered. The patient agreed with the plan and demonstrated an understanding of the instructions. The patient was advised to call back or seek an in-person office evaluation/go to [facility] for any urgent or concerning symptoms. Please refer to After Visit Summary for other counseling recommendations.   I provided *** minutes of face-to-face time during this encounter.  No follow-ups on  file.  Future Appointments  Date Time Provider Department Center  06/05/2024  8:10 AM Cleatus Moccasin, MD [facility]  06/09/2024  8:30 AM [facility] NURSE [facility]  06/09/2024  8:45 AM [facility] NST [facility]  06/16/2024  8:30 AM [facility] NURSE [facility]  06/16/2024  8:45 AM [facility] NST [facility]  06/20/2024  8:10 AM Rasch, Delon FERNS, NP [facility]  06/23/2024  9:15 AM [facility] PROVIDER 1 [facility]  06/23/2024  9:30 AM [facility] US5 [facility]  06/25/2024  8:10 AM Erik Kieth BROCKS, MD [facility]    Moccasin Cleatus, MD [facility] for [facility]

## 2024-06-05 ENCOUNTER — Encounter: Admitting: Obstetrics and Gynecology

## 2024-06-05 DIAGNOSIS — Z3A34 34 weeks gestation of pregnancy: Secondary | ICD-10-CM

## 2024-06-05 DIAGNOSIS — O24414 Gestational diabetes mellitus in pregnancy, insulin controlled: Secondary | ICD-10-CM

## 2024-06-05 DIAGNOSIS — O43199 Other malformation of placenta, unspecified trimester: Secondary | ICD-10-CM

## 2024-06-05 DIAGNOSIS — O0990 Supervision of high risk pregnancy, unspecified, unspecified trimester: Secondary | ICD-10-CM

## 2024-06-05 DIAGNOSIS — Z8759 Personal history of other complications of pregnancy, childbirth and the puerperium: Secondary | ICD-10-CM

## 2024-06-09 ENCOUNTER — Ambulatory Visit

## 2024-06-16 ENCOUNTER — Ambulatory Visit

## 2024-06-20 ENCOUNTER — Telehealth: Payer: Self-pay

## 2024-06-20 ENCOUNTER — Other Ambulatory Visit: Payer: Self-pay | Admitting: Obstetrics and Gynecology

## 2024-06-20 ENCOUNTER — Other Ambulatory Visit (HOSPITAL_COMMUNITY)
Admission: RE | Admit: 2024-06-20 | Discharge: 2024-06-20 | Disposition: A | Discharge status: Home / Self Care | Source: Ambulatory Visit | Attending: Obstetrics and Gynecology | Admitting: Obstetrics and Gynecology

## 2024-06-20 ENCOUNTER — Ambulatory Visit: Admitting: Obstetrics and Gynecology

## 2024-06-20 VITALS — BP 114/84 | HR 116 | Wt 191.0 lb

## 2024-06-20 DIAGNOSIS — O24414 Gestational diabetes mellitus in pregnancy, insulin controlled: Secondary | ICD-10-CM

## 2024-06-20 DIAGNOSIS — Z3A36 36 weeks gestation of pregnancy: Secondary | ICD-10-CM | POA: Insufficient documentation

## 2024-06-20 DIAGNOSIS — O0990 Supervision of high risk pregnancy, unspecified, unspecified trimester: Secondary | ICD-10-CM | POA: Diagnosis present

## 2024-06-20 DIAGNOSIS — O24419 Gestational diabetes mellitus in pregnancy, unspecified control: Secondary | ICD-10-CM

## 2024-06-20 MED ORDER — INSULIN GLARGINE 100 UNIT/ML SOLOSTAR PEN: 32.0000 [IU] | PEN_INJECTOR | Freq: Every day | SUBCUTANEOUS | 1 refills | Status: DC

## 2024-06-20 MED ORDER — INSULIN PEN NEEDLE 31G X 5 MM MISC: 1.0000 | Freq: Two times a day (BID) | 1 refills | Status: DC

## 2024-06-20 MED ORDER — GLUCOSE BLOOD VI STRP: ORAL_STRIP | 3 refills | Status: DC

## 2024-06-20 NOTE — Telephone Encounter (Signed)
 RN spoke with pharmacy/[facility] per request of provider. [facility] reported $38 for test strips, insurance covered some, per pharmacy lancets need PA or can purchase out of pocket. Cost out of pocket would be $37.98. RN notified patient and provider.   Ashley LELON Piano, RN

## 2024-06-20 NOTE — Progress Notes (Addendum)
 PRENATAL VISIT NOTE  Subjective:  Ashley Watson is a 33 y.o. G3P1011 at [redacted]w[redacted]d being seen today for ongoing prenatal care.  She is currently monitored for the following issues for this high-risk pregnancy and has History of severe pre-eclampsia; Gestational diabetes mellitus in pregnancy, insulin  controlled; Supervision of high risk pregnancy, antepartum; History of prior pregnancy with IUGR newborn; History of vacuum extraction assisted delivery; and Marginal insertion of umbilical cord affecting management of mother on their problem list.  Patient reports no complaints.  Contractions: Irregular. Vag. Bleeding: None.  Movement: Present. Denies leaking of fluid.   The following portions of the patient's history were reviewed and updated as appropriate: allergies, current medications, past family history, past medical history, past social history, past surgical history and problem list.   Objective:   Vitals:   06/20/24 0809  BP: 114/84  Pulse: (!) 116  Weight: 191 lb (86.6 kg)    Fetal Status:  Fetal Heart Rate (bpm): 140   Movement: Present    General: Alert, oriented and cooperative. Patient is in no acute distress.  Skin: Skin is warm and dry. No rash noted.   Cardiovascular: Normal heart rate noted  Respiratory: Normal respiratory effort, no problems with respiration noted  Abdomen: Soft, gravid, appropriate for gestational age.  Pain/Pressure: Present     Pelvic: Cervical exam deferred        Extremities: Normal range of motion.  Edema: Trace  Mental Status: Normal mood and affect. Normal behavior. Normal judgment and thought content.      04/24/2024   11:12 AM 01/04/2024    8:41 AM 01/16/2023    4:04 PM  Depression screen PHQ 2/9  Decreased Interest 0 0 0  Down, Depressed, Hopeless 0 0 0  PHQ - 2 Score 0 0 0  Altered sleeping 0 0   Tired, decreased energy 0 0   Change in appetite 0 0   Feeling bad or failure about yourself  0 0   Trouble concentrating 0 0   Moving  slowly or fidgety/restless 0 0   Suicidal thoughts 0 0   PHQ-9 Score 0  0    Difficult doing work/chores  Not difficult at all      Data saved with a previous flowsheet row definition        04/24/2024   11:12 AM 01/04/2024    8:41 AM 07/01/2021    1:00 PM 02/10/2021    3:17 PM  GAD 7 : Generalized Anxiety Score  Nervous, Anxious, on Edge 0 0 0 0  Control/stop worrying 0 0 0 0  Worry too much - different things 0 0 0 0  Trouble relaxing 0 0 0 0  Restless 0 0 0 0  Easily annoyed or irritable 0 0 0 0  Afraid - awful might happen 0 0 0 0  Total GAD 7 Score 0 0 0 0  Anxiety Difficulty  Not difficult at all      Assessment and Plan:   1. [redacted] weeks gestation of pregnancy  GBS/GC collected today.  2. Supervision of high risk pregnancy, antepartum (Primary)  -Keep MFM growth US  on 11/24- this will help determine delivery timing.  - Ran out of ASA, has not been taking.   3. Insulin  controlled gestational diabetes mellitus (GDM) in third trimester  -Has missed multiple MFM visits and missed her last OB visit @ 34 weeks -NST today: reactive. Baseline 125 bpm, moderate variability, 15x15 acccels, no decels.  -Ran out of Lancets,  has not been checking 2 hour PP -Fasting BS reviewed an all less than 95 -Continue Lantus  18/14 and metformin  500 mg qAM -She will send 2 hour PP log through mychart over the weekend.  -Rx Lancets sent and confirmed with pharmacy regarding coverage   -Lengthy discussion about importance of testing blood sugars, bringing log book and keeping appointments. Discussed risks of uncontrolled DM in pregnancy including delayed fetal lung maturity, IUFD and shoulder dystocia possibly resulting brachial plexus palsy, brain damage, intrapartum death and extensive obstetric lacerations. Patient verbalizes understanding.    Preterm labor symptoms and general obstetric precautions including but not limited to vaginal bleeding, contractions, leaking of fluid and fetal  movement were reviewed in detail with the patient. Please refer to After Visit Summary for other counseling recommendations.   No follow-ups on file.  Future Appointments  Date Time Provider Department Center  06/23/2024  9:15 AM [facility] PROVIDER 1 [facility]  06/23/2024  9:30 AM [facility] US5 [facility]  06/25/2024  8:10 AM Erik Kieth BROCKS, MD [facility]    Delon Emms, NP

## 2024-06-22 LAB — STREP GP B NAA: Strep Gp B NAA: NEGATIVE

## 2024-06-23 ENCOUNTER — Ambulatory Visit (HOSPITAL_BASED_OUTPATIENT_CLINIC_OR_DEPARTMENT_OTHER)

## 2024-06-23 ENCOUNTER — Telehealth: Payer: Self-pay | Admitting: *Deleted

## 2024-06-23 ENCOUNTER — Other Ambulatory Visit: Payer: Self-pay | Admitting: *Deleted

## 2024-06-23 ENCOUNTER — Ambulatory Visit: Attending: Obstetrics and Gynecology | Admitting: Obstetrics and Gynecology

## 2024-06-23 VITALS — BP 123/75

## 2024-06-23 DIAGNOSIS — O24414 Gestational diabetes mellitus in pregnancy, insulin controlled: Secondary | ICD-10-CM

## 2024-06-23 DIAGNOSIS — O09293 Supervision of pregnancy with other poor reproductive or obstetric history, third trimester: Secondary | ICD-10-CM | POA: Diagnosis not present

## 2024-06-23 DIAGNOSIS — O43199 Other malformation of placenta, unspecified trimester: Secondary | ICD-10-CM

## 2024-06-23 DIAGNOSIS — Z3A37 37 weeks gestation of pregnancy: Secondary | ICD-10-CM | POA: Insufficient documentation

## 2024-06-23 DIAGNOSIS — Z794 Long term (current) use of insulin: Secondary | ICD-10-CM | POA: Insufficient documentation

## 2024-06-23 DIAGNOSIS — O43193 Other malformation of placenta, third trimester: Secondary | ICD-10-CM | POA: Insufficient documentation

## 2024-06-23 LAB — CERVICOVAGINAL ANCILLARY ONLY
Chlamydia: NEGATIVE
Comment: NEGATIVE
Comment: NORMAL
Neisseria Gonorrhea: NEGATIVE

## 2024-06-23 NOTE — Telephone Encounter (Signed)
 Returned call from 10:14 AM. Patient not able to complete call due to being on a work call. Office will call patient back after lunch.

## 2024-06-23 NOTE — Progress Notes (Signed)
 Maternal-Fetal Medicine Consultation  Name: Ashley Watson  MRN: [id number]  GA: [id number] [redacted]w[redacted]d   Patient is here for fetal growth assessment. She takes Lantus  insulin  14 units in the morning and 18 units at night.  Her fasting and postprandial levels are reportedly within normal range.  Patient takes metformin  500 mg with breakfast.  Ultrasound Normal fetal growth and amniotic fluid.  Cephalic presentation.  BPP 8/8. I reassured the patient of the findings.  Today, we discussed timing of delivery.  Given that her diabetes is well-controlled, she can be delivered at [redacted] weeks gestation.  I encouraged her to check her blood glucose regularly. Obstetrical history significant for a term vaginal delivery.  Induction of labor at [redacted] weeks gestation is likely to be more successful.  Recommendations - NST next week (patient prefers to have an NST at [place] office).     Consultation including face-to-face (more than 50%) counseling 20 minutes.

## 2024-06-25 ENCOUNTER — Ambulatory Visit (INDEPENDENT_AMBULATORY_CARE_PROVIDER_SITE_OTHER): Admitting: Obstetrics and Gynecology

## 2024-06-25 VITALS — BP 124/70 | HR 102 | Wt 193.0 lb

## 2024-06-25 DIAGNOSIS — Z8759 Personal history of other complications of pregnancy, childbirth and the puerperium: Secondary | ICD-10-CM

## 2024-06-25 DIAGNOSIS — O43199 Other malformation of placenta, unspecified trimester: Secondary | ICD-10-CM

## 2024-06-25 DIAGNOSIS — O0990 Supervision of high risk pregnancy, unspecified, unspecified trimester: Secondary | ICD-10-CM | POA: Diagnosis not present

## 2024-06-25 DIAGNOSIS — Z3A37 37 weeks gestation of pregnancy: Secondary | ICD-10-CM | POA: Diagnosis not present

## 2024-06-25 DIAGNOSIS — Z23 Encounter for immunization: Secondary | ICD-10-CM

## 2024-06-25 DIAGNOSIS — O24414 Gestational diabetes mellitus in pregnancy, insulin controlled: Secondary | ICD-10-CM | POA: Diagnosis not present

## 2024-06-25 NOTE — Progress Notes (Signed)
   PRENATAL VISIT NOTE  Subjective:  KHRISTI SCHILLER is a 33 y.o. G3P1011 at [redacted]w[redacted]d being seen today for ongoing prenatal care.  She is currently monitored for the following issues for this high-risk pregnancy and has History of severe pre-eclampsia; Gestational diabetes mellitus in pregnancy, insulin  controlled; Supervision of high risk pregnancy, antepartum; History of prior pregnancy with IUGR newborn; History of vacuum extraction assisted delivery; and Marginal insertion of umbilical cord affecting management of mother on their problem list.  Patient reports more pelvic pressure otherwise doing well.  Contractions: Irritability. Vag. Bleeding: None.  Movement: Present. Denies leaking of fluid.   The following portions of the patient's history were reviewed and updated as appropriate: allergies, current medications, past family history, past medical history, past social history, past surgical history and problem list.   Objective:   Vitals:   06/25/24 0813  BP: 124/70  Pulse: (!) 102  Weight: 193 lb (87.5 kg)    Fetal Status: Fetal Heart Rate (bpm): 136   Movement: Present     General:  Alert, oriented and cooperative. Patient is in no acute distress.  Skin: Skin is warm and dry. No rash noted.   Cardiovascular: Normal heart rate noted  Respiratory: Normal respiratory effort, no problems with respiration noted  Abdomen: Soft, gravid, appropriate for gestational age.  Pain/Pressure: Present      Assessment and Plan:  Pregnancy: G3P1011 at [redacted]w[redacted]d 1. Supervision of high risk pregnancy, antepartum (Primary) 2. [redacted] weeks gestation of pregnancy GBS neg Tdap & flu shot today  3. Insulin  controlled gestational diabetes mellitus (GDM) in third trimester BG reviewed - pan elevations in lunch/dinner values Increase AM lantus  to 18u. Continue lantus  18u at bedtime & metformin  500mg  daily Weekly antenatal testing, BPP 8/8 on 11/24, NST next week in our office @37w0  2903g (27%),  cephalic IOL ordered for 12/8 (39wk)  4. Marginal insertion of umbilical cord affecting management of mother 5. History of prior pregnancy with IUGR newborn Growth above  6. History of severe pre-eclampsia Normotensive, normal baseline labs ldASA - not taking, plans to pick up rx today  7. History of vacuum extraction assisted delivery Outlet for NRFHT  Please refer to After Visit Summary for other counseling recommendations.   Return in about 1 week (around 07/02/2024) for ROB with NST.  No future appointments.  Kieth JAYSON Carolin, MD

## 2024-07-01 ENCOUNTER — Other Ambulatory Visit: Payer: Self-pay

## 2024-07-01 ENCOUNTER — Telehealth (HOSPITAL_COMMUNITY): Payer: Self-pay | Admitting: *Deleted

## 2024-07-01 ENCOUNTER — Encounter (HOSPITAL_COMMUNITY): Payer: Self-pay | Admitting: Obstetrics and Gynecology

## 2024-07-01 ENCOUNTER — Ambulatory Visit: Admitting: Obstetrics and Gynecology

## 2024-07-01 ENCOUNTER — Inpatient Hospital Stay (HOSPITAL_COMMUNITY)
Admission: AD | Admit: 2024-07-01 | Discharge: 2024-07-04 | DRG: 807 | Disposition: A | Attending: Obstetrics and Gynecology | Admitting: Obstetrics and Gynecology

## 2024-07-01 VITALS — BP 128/83 | HR 92 | Wt 195.0 lb

## 2024-07-01 DIAGNOSIS — O134 Gestational [pregnancy-induced] hypertension without significant proteinuria, complicating childbirth: Principal | ICD-10-CM | POA: Diagnosis present

## 2024-07-01 DIAGNOSIS — O24424 Gestational diabetes mellitus in childbirth, insulin controlled: Secondary | ICD-10-CM | POA: Diagnosis not present

## 2024-07-01 DIAGNOSIS — O24414 Gestational diabetes mellitus in pregnancy, insulin controlled: Secondary | ICD-10-CM

## 2024-07-01 DIAGNOSIS — Z833 Family history of diabetes mellitus: Secondary | ICD-10-CM

## 2024-07-01 DIAGNOSIS — O43199 Other malformation of placenta, unspecified trimester: Secondary | ICD-10-CM | POA: Diagnosis present

## 2024-07-01 DIAGNOSIS — O133 Gestational [pregnancy-induced] hypertension without significant proteinuria, third trimester: Secondary | ICD-10-CM | POA: Insufficient documentation

## 2024-07-01 DIAGNOSIS — O4423 Partial placenta previa NOS or without hemorrhage, third trimester: Secondary | ICD-10-CM | POA: Diagnosis not present

## 2024-07-01 DIAGNOSIS — O0990 Supervision of high risk pregnancy, unspecified, unspecified trimester: Secondary | ICD-10-CM

## 2024-07-01 DIAGNOSIS — Z3A38 38 weeks gestation of pregnancy: Secondary | ICD-10-CM

## 2024-07-01 DIAGNOSIS — Z91040 Latex allergy status: Secondary | ICD-10-CM

## 2024-07-01 DIAGNOSIS — O43193 Other malformation of placenta, third trimester: Secondary | ICD-10-CM | POA: Diagnosis present

## 2024-07-01 DIAGNOSIS — Z8249 Family history of ischemic heart disease and other diseases of the circulatory system: Secondary | ICD-10-CM

## 2024-07-01 HISTORY — DX: Gestational (pregnancy-induced) hypertension without significant proteinuria, third trimester: O13.3

## 2024-07-01 LAB — COMPREHENSIVE METABOLIC PANEL WITH GFR
ALT: 22 U/L (ref 0–44)
AST: 25 U/L (ref 15–41)
Albumin: 2.5 g/dL — ABNORMAL LOW (ref 3.5–5.0)
Alkaline Phosphatase: 101 U/L (ref 38–126)
Anion gap: 7 (ref 5–15)
BUN: 21 mg/dL — ABNORMAL HIGH (ref 6–20)
CO2: 22 mmol/L (ref 22–32)
Calcium: 8.8 mg/dL — ABNORMAL LOW (ref 8.9–10.3)
Chloride: 103 mmol/L (ref 98–111)
Creatinine, Ser: 0.8 mg/dL (ref 0.44–1.00)
GFR, Estimated: >60 mL/min (ref >60)
Glucose, Bld: 156 mg/dL — ABNORMAL HIGH (ref 70–99)
Potassium: 4 mmol/L (ref 3.5–5.1)
Sodium: 132 mmol/L — ABNORMAL LOW (ref 135–145)
Total Bilirubin: 0.4 mg/dL (ref 0.0–1.2)
Total Protein: 6 g/dL — ABNORMAL LOW (ref 6.5–8.1)

## 2024-07-01 LAB — TYPE AND SCREEN
ABO/RH(D): A POS
Antibody Screen: NEGATIVE

## 2024-07-01 LAB — CBC
HCT: 33.6 % — ABNORMAL LOW (ref 36.0–46.0)
HCT: 34.1 % — ABNORMAL LOW (ref 36.0–46.0)
Hemoglobin: 11.1 g/dL — ABNORMAL LOW (ref 12.0–15.0)
Hemoglobin: 11.2 g/dL — ABNORMAL LOW (ref 12.0–15.0)
MCH: 28.2 pg (ref 26.0–34.0)
MCH: 28.6 pg (ref 26.0–34.0)
MCHC: 33 g/dL (ref 30.0–36.0)
MCHC: 32.8 g/dL (ref 30.0–36.0)
MCV: 85.3 fL (ref 80.0–100.0)
MCV: 87 fL (ref 80.0–100.0)
Platelets: 179 K/uL (ref 150–400)
Platelets: 173 K/uL (ref 150–400)
RBC: 3.94 MIL/uL (ref 3.87–5.11)
RBC: 3.92 MIL/uL (ref 3.87–5.11)
RDW: 13.5 % (ref 11.5–15.5)
RDW: 13.7 % (ref 11.5–15.5)
WBC: 14.1 K/uL — ABNORMAL HIGH (ref 4.0–10.5)
WBC: 15.3 K/uL — ABNORMAL HIGH (ref 4.0–10.5)
nRBC: 0 % (ref 0.0–0.2)
nRBC: 0 % (ref 0.0–0.2)

## 2024-07-01 LAB — GLUCOSE, CAPILLARY
Glucose-Capillary: 144 mg/dL — ABNORMAL HIGH (ref 70–99)
Glucose-Capillary: 119 mg/dL — ABNORMAL HIGH (ref 70–99)
Glucose-Capillary: 131 mg/dL — ABNORMAL HIGH (ref 70–99)
Glucose-Capillary: 84 mg/dL (ref 70–99)

## 2024-07-01 MED ORDER — MISOPROSTOL 25 MCG QUARTER TABLET
25.0000 ug | ORAL_TABLET | ORAL | Status: DC | PRN
  Administered 2024-07-01: 25 ug via VAGINAL
  Filled 2024-07-01: qty 1

## 2024-07-01 MED ORDER — INSULIN ASPART 100 UNIT/ML IJ SOLN: 0.0000 [IU] | INTRAMUSCULAR | Status: DC

## 2024-07-01 MED ORDER — OXYTOCIN BOLUS FROM INFUSION
333.0000 mL | Freq: Once | INTRAVENOUS | Status: AC
  Administered 2024-07-02: 333 mL via INTRAVENOUS

## 2024-07-01 MED ORDER — LACTATED RINGERS IV SOLN: 500.0000 mL | Freq: Once | INTRAVENOUS | Status: DC

## 2024-07-01 MED ORDER — TERBUTALINE SULFATE 1 MG/ML IJ SOLN: 0.2500 mg | Freq: Once | INTRAMUSCULAR | Status: DC | PRN

## 2024-07-01 MED ORDER — SODIUM CHLORIDE 0.9% FLUSH: 3.0000 mL | INTRAVENOUS | Status: DC | PRN

## 2024-07-01 MED ORDER — ONDANSETRON HCL 4 MG/2ML IJ SOLN: 4.0000 mg | Freq: Four times a day (QID) | INTRAMUSCULAR | Status: DC | PRN

## 2024-07-01 MED ORDER — LACTATED RINGERS IV SOLN
500.0000 mL | INTRAVENOUS | Status: DC | PRN
  Administered 2024-07-02: 500 mL via INTRAVENOUS

## 2024-07-01 MED ORDER — FENTANYL CITRATE (PF) 100 MCG/2ML IJ SOLN: 50.0000 ug | INTRAMUSCULAR | Status: DC | PRN

## 2024-07-01 MED ORDER — DIPHENHYDRAMINE HCL 50 MG/ML IJ SOLN: 12.5000 mg | INTRAMUSCULAR | Status: DC | PRN

## 2024-07-01 MED ORDER — PHENYLEPHRINE 80 MCG/ML (10ML) SYRINGE FOR IV PUSH (FOR BLOOD PRESSURE SUPPORT): 80.0000 ug | PREFILLED_SYRINGE | INTRAVENOUS | Status: DC | PRN

## 2024-07-01 MED ORDER — LACTATED RINGERS IV SOLN
INTRAVENOUS | Status: AC
  Administered 2024-07-01: 125 mL/h via INTRAVENOUS

## 2024-07-01 MED ORDER — FENTANYL-BUPIVACAINE-NACL 0.5-0.125-0.9 MG/250ML-% EP SOLN
12.0000 mL/h | EPIDURAL | Status: DC | PRN
  Administered 2024-07-02: 12 mL/h via EPIDURAL
  Filled 2024-07-01: qty 250

## 2024-07-01 MED ORDER — INSULIN GLARGINE-YFGN 100 UNIT/ML ~~LOC~~ SOLN
18.0000 [IU] | Freq: Once | SUBCUTANEOUS | Status: AC
  Administered 2024-07-01: 18 [IU] via SUBCUTANEOUS
  Filled 2024-07-01: qty 0.18

## 2024-07-01 MED ORDER — PHENYLEPHRINE 80 MCG/ML (10ML) SYRINGE FOR IV PUSH (FOR BLOOD PRESSURE SUPPORT)
80.0000 ug | PREFILLED_SYRINGE | INTRAVENOUS | Status: DC | PRN
  Filled 2024-07-01: qty 10

## 2024-07-01 MED ORDER — ACETAMINOPHEN 325 MG PO TABS
650.0000 mg | ORAL_TABLET | ORAL | Status: DC | PRN
  Administered 2024-07-02: 650 mg via ORAL
  Filled 2024-07-01: qty 2

## 2024-07-01 MED ORDER — LIDOCAINE HCL (PF) 1 % IJ SOLN: 30.0000 mL | INTRAMUSCULAR | Status: DC | PRN

## 2024-07-01 MED ORDER — SODIUM CHLORIDE 0.9% FLUSH: 3.0000 mL | Freq: Two times a day (BID) | INTRAVENOUS | Status: DC

## 2024-07-01 MED ORDER — SODIUM CHLORIDE 0.9 % IV SOLN: 250.0000 mL | INTRAVENOUS | Status: DC | PRN

## 2024-07-01 MED ORDER — SOD CITRATE-CITRIC ACID 500-334 MG/5ML PO SOLN: 30.0000 mL | ORAL | Status: DC | PRN

## 2024-07-01 MED ORDER — OXYTOCIN-SODIUM CHLORIDE 30-0.9 UT/500ML-% IV SOLN
1.0000 m[IU]/min | INTRAVENOUS | Status: DC
  Administered 2024-07-01: 2 m[IU]/min via INTRAVENOUS

## 2024-07-01 MED ORDER — OXYTOCIN-SODIUM CHLORIDE 30-0.9 UT/500ML-% IV SOLN
2.5000 [IU]/h | INTRAVENOUS | Status: DC
  Filled 2024-07-01: qty 500

## 2024-07-01 MED ORDER — EPHEDRINE 5 MG/ML INJ: 10.0000 mg | INTRAVENOUS | Status: DC | PRN

## 2024-07-01 NOTE — Telephone Encounter (Signed)
 Preadmission screen

## 2024-07-01 NOTE — H&P (Signed)
 LABOR AND DELIVERY ADMISSION HISTORY AND PHYSICAL NOTE  Ashley Watson is a 33 y.o. female G3P1011 with IUP at [redacted]w[redacted]d by 10 wk u/s presenting for iol for ghtn. No ha, vision change, abd pain or SOB.  She reports positive fetal movement. She denies leakage of fluid or vaginal bleeding.  Prenatal History/Complications: A2gdm  H/o prior pregnancy with pre-eclampsia with severe features and IUGR. Required vacuum-assisted delivery 2/2 decreased FHR.  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    Past Surgical History: Past Surgical History:  Procedure Laterality Date   arm surgery 2024 Right 09/13/2022   LAPAROSCOPIC UNILATERAL SALPINGECTOMY Left 07/08/2020   Procedure: LAPARSCOPIC left salpingectomy, chromotubation,  lysis of adhesions, and electrosurgical excision and vaporization of ovarian and peritoneal endometriotic lesions;  Surgeon: Yalcinkaya, Tamer, MD;  Location: [facility];  Service: Gynecology;  Laterality: Left;   TOOTH EXTRACTION  2016    Obstetrical History: OB History     Gravida  3   Para  1   Term  1   Preterm  0   AB  1   Living  1      SAB  1   IAB  0   Ectopic  0   Multiple  0   Live Births  1           Social History: Social History   Socioeconomic History   Marital status: Married    Spouse name: Lynwood   Number of children: Not on file   Years of education: Not on file   Highest education level: Not on file  Occupational History   Occupation: customer service  Tobacco Use   Smoking status: Never   Smokeless tobacco: Never  Vaping Use   Vaping status: Never Used  Substance and Sexual Activity   Alcohol use: Not Currently    Alcohol/week: 1.0  standard drink of alcohol    Types: 1 Glasses of wine per week    Comment: occ x 1 week wine   Drug use: Not Currently    Frequency: 7.0 times per week    Types: Marijuana    Comment: none since preg   Sexual activity: Yes    Partners: Male    Birth control/protection: None  Other Topics Concern   Not on file  Social History Narrative   Not on file   Social Drivers of Health   Financial Resource Strain: Not on file  Food Insecurity: No Food Insecurity (07/01/2024)   Hunger Vital Sign    Worried About Running Out of Food in the Last Year: Never true    Ran Out of Food in the Last Year: Never true  Transportation Needs: No Transportation Needs (07/01/2024)   PRAPARE - Administrator, Civil Service (Medical): No    Lack of Transportation (Non-Medical): No  Physical Activity: Not on file  Stress: Not on file  Social Connections: Not on file    Family History: Family History  Problem Relation Age of Onset   Diabetes Father    Hypertension Father    Gout Father    Migraines Father    Seizures Maternal Grandfather     Allergies: Allergies  Allergen Reactions   Latex  Rash    Medications Prior to Admission  Medication Sig Dispense Refill Last Dose/Taking   Accu-Chek Softclix Lancets lancets 1 each by Other route 4 (four) times daily. Please check Blood Sugar 4x daily 200 each 3    aspirin  EC 81 MG tablet Take 2 tablets (162 mg total) by mouth at bedtime. Swallow whole. 180 tablet 3    Blood Glucose Monitoring Suppl (ACCU-CHEK GUIDE) w/Device KIT 1 Device by Does not apply route 4 (four) times daily. 1 kit 0    glucose blood test strip Please check Blood Sugar 4x daily 200 each 3    insulin  glargine (LANTUS ) 100 UNIT/ML Solostar Pen Inject 32 Units into the skin daily. Take 14 Units qAM and 18 units qPM. 28.8 mL 1    Insulin  Pen Needle 31G X 5 MM MISC 1 Needle by Does not apply route 2 (two) times daily. 100 each 1    metFORMIN  (GLUCOPHAGE ) 500 MG tablet Take 1  tablet (500 mg total) by mouth daily with breakfast. 90 tablet 1    Prenatal Vit-Fe Fumarate-FA (PRENATAL PO) Take 1 tablet by mouth daily.        Review of Systems   All systems reviewed and negative except as stated in HPI  Blood pressure 139/79, pulse 100, temperature 98.6 F (37 C), temperature source Oral, resp. rate 16, height 5' 4 (1.626 m), weight 89.2 kg, last menstrual period 10/07/2023, SpO2 95%. General appearance: alert, cooperative, and appears stated age Lungs: clear to auscultation bilaterally Heart: regular rate and rhythm Abdomen: soft, non-tender; bowel sounds normal Extremities: No calf swelling or tenderness Presentation: cephalic Fetal monitoring: baseline 140 bpm, moderate variability, + accels, - decels Uterine activity:  Dilation: 1.5 Effacement (%): 50 Station: -3 Exam by:: MD Wouk  Prenatal labs: ABO, Rh: --/--/PENDING (12/02 1640) Antibody: PENDING (12/02 1640) Rubella: 4.91 (06/06 0932) RPR: Non Reactive (09/25 0941)  HBsAg: Negative (06/06 0932)  HIV: Non Reactive (09/25 0941)  GBS: Negative/-- (11/21 9167)  2 hr Glucola: abnormal Genetic screening:  nips wnl Anatomy US : wnl  Prenatal Transfer Tool  Maternal Diabetes: Yes:  Diabetes Type:  Insulin /Medication controlled Genetic Screening: Normal Maternal Ultrasounds/Referrals: Normal Fetal Ultrasounds or other Referrals:  None Maternal Substance Abuse:  No Significant Maternal Medications:  insulin , metformin  Significant Maternal Lab Results: Group B Strep negative  Results for orders placed or performed during the hospital encounter of 07/01/24 (from the past 24 hours)  CBC   Collection Time: 07/01/24  4:31 PM  Result Value Ref Range   WBC 14.1 (H) 4.0 - 10.5 K/uL   RBC 3.94 3.87 - 5.11 MIL/uL   Hemoglobin 11.1 (L) 12.0 - 15.0 g/dL   HCT 66.3 (L) 63.9 - 53.9 %   MCV 85.3 80.0 - 100.0 fL   MCH 28.2 26.0 - 34.0 pg   MCHC 33.0 30.0 - 36.0 g/dL   RDW 86.4 88.4 - 84.4 %   Platelets  179 150 - 400 K/uL   nRBC 0.0 0.0 - 0.2 %  Comprehensive metabolic panel   Collection Time: 07/01/24  4:31 PM  Result Value Ref Range   Sodium 132 (L) 135 - 145 mmol/L   Potassium 4.0 3.5 - 5.1 mmol/L   Chloride 103 98 - 111 mmol/L   CO2 22 22 - 32 mmol/L   Glucose, Bld 156 (H) 70 - 99 mg/dL   BUN 21 (H) 6 - 20 mg/dL   Creatinine, Ser 9.19 0.44 - 1.00 mg/dL   Calcium 8.8 (L) 8.9 -  10.3 mg/dL   Total Protein 6.0 (L) 6.5 - 8.1 g/dL   Albumin 2.5 (L) 3.5 - 5.0 g/dL   AST 25 15 - 41 U/L   ALT 22 0 - 44 U/L   Alkaline Phosphatase 101 38 - 126 U/L   Total Bilirubin 0.4 0.0 - 1.2 mg/dL   GFR, Estimated >39 >39 mL/min   Anion gap 7 5 - 15  Type and screen   Collection Time: 07/01/24  4:40 PM  Result Value Ref Range   ABO/RH(D) PENDING    Antibody Screen PENDING    Sample Expiration      07/04/2024,2359 Performed at [facility], [address], [place], [place] [address]   Glucose, capillary   Collection Time: 07/01/24  4:57 PM  Result Value Ref Range   Glucose-Capillary 144 (H) 70 - 99 mg/dL    Patient Active Problem List   Diagnosis Date Noted   Gestational hypertension, third trimester 07/01/2024   Labor and delivery, indication for care 07/01/2024   Marginal insertion of umbilical cord affecting management of mother 02/28/2024   History of prior pregnancy with IUGR newborn 01/31/2024   History of vacuum extraction assisted delivery 01/31/2024   Supervision of high risk pregnancy, antepartum 01/04/2024   Gestational diabetes mellitus in pregnancy, insulin  controlled 07/22/2021   History of severe pre-eclampsia 06/22/2021    Assessment: Ashley Watson is a 33 y.o. G3P1011 at [redacted]w[redacted]d here for iol for ghtn  # ghtn: mild (139/79 on admission), asymptomatic, labs wnl. Continue to monitor BP  # A2gdm: reports adequate control as of late. Ran out of insulin  over the weekend so has not been able to take it. Normal growth and fluid last growth scan. Initial glucose 144.  Start home glargine 18 units. Plan for q4h monitoring for now.  # marginal cord insertion: noted  #Labor: Unable to place foley catheter. Gave cytotec  vaginally. Plan for cervical check in 4 hours with possible cook catheter placement vs pitocin  #Pain: Per patient request #FWB: Cat 1 #ID:  Gbs neg #MOF: Breast milk #MOC: Partner is getting a vasectomy #Circ:  Gender of baby is a surprise; will confirm after birth   Darren Jernigan 07/01/2024, 5:48 PM

## 2024-07-01 NOTE — Progress Notes (Signed)
 PRENATAL VISIT NOTE  Subjective:  Ashley Watson is a 33 y.o. G3P1011 at [redacted]w[redacted]d being seen today for ongoing prenatal care.  She is currently monitored for the following issues for this high-risk pregnancy and has History of severe pre-eclampsia; Gestational diabetes mellitus in pregnancy, insulin  controlled; Supervision of high risk pregnancy, antepartum; History of prior pregnancy with IUGR newborn; History of vacuum extraction assisted delivery; and Marginal insertion of umbilical cord affecting management of mother on their problem list.  Patient reports no complaints.  Contractions: Not present. Vag. Bleeding: None.  Movement: Present. Denies leaking of fluid.   The following portions of the patient's history were reviewed and updated as appropriate: allergies, current medications, past family history, past medical history, past social history, past surgical history and problem list.   Objective:   Vitals:   07/01/24 1331 07/01/24 1333  BP: (!) 144/87 128/83  Pulse: (!) 105 92  Weight: 195 lb (88.5 kg)     Fetal Status:  Fetal Heart Rate (bpm): 145   Movement: Present    General: Alert, oriented and cooperative. Patient is in no acute distress.  Skin: Skin is warm and dry. No rash noted.   Cardiovascular: Normal heart rate noted  Respiratory: Normal respiratory effort, no problems with respiration noted  Abdomen: Soft, gravid, appropriate for gestational age.  Pain/Pressure: Absent     Pelvic: Cervical exam deferred        Extremities: Normal range of motion.  Edema: None  Mental Status: Normal mood and affect. Normal behavior. Normal judgment and thought content.      04/24/2024   11:12 AM 01/04/2024    8:41 AM 01/16/2023    4:04 PM  Depression screen PHQ 2/9  Decreased Interest 0 0 0  Down, Depressed, Hopeless 0 0 0  PHQ - 2 Score 0 0 0  Altered sleeping 0 0   Tired, decreased energy 0 0   Change in appetite 0 0   Feeling bad or failure about yourself  0 0   Trouble  concentrating 0 0   Moving slowly or fidgety/restless 0 0   Suicidal thoughts 0 0   PHQ-9 Score 0  0    Difficult doing work/chores  Not difficult at all      Data saved with a previous flowsheet row definition        04/24/2024   11:12 AM 01/04/2024    8:41 AM 07/01/2021    1:00 PM 02/10/2021    3:17 PM  GAD 7 : Generalized Anxiety Score  Nervous, Anxious, on Edge 0 0 0 0  Control/stop worrying 0 0 0 0  Worry too much - different things 0 0 0 0  Trouble relaxing 0 0 0 0  Restless 0 0 0 0  Easily annoyed or irritable 0 0 0 0  Afraid - awful might happen 0 0 0 0  Total GAD 7 Score 0 0 0 0  Anxiety Difficulty  Not difficult at all      Assessment and Plan:  Pregnancy: G3P1011 at [redacted]w[redacted]d  1. Insulin  controlled gestational diabetes mellitus (GDM) in third trimester (Primary)  - Has not taken her insulin  for a few days. Reports fasting's are 95-99 - Fetal nonstress test: Variable deceleration noted; 30 secs with good return to baseline.  - given new diagnoses of gHTN, history of severe pre E, A2 DM - not currently taking insulin , risk of fetal demise is high. Will send to [facility] for induction of labor. Team of  providers at [facility] notified. Cat is happy with plan of care.   2. Gestational hypertension, third trimester  Initial BP elevated with repeat normal. This is the 2nd elevated BP in the pregnancy (147/68 on 10/27) Induction of labor planned for today- patient to go to [facility] now.   Term labor symptoms and general obstetric precautions including but not limited to vaginal bleeding, contractions, leaking of fluid and fetal movement were reviewed in detail with the patient. Please refer to After Visit Summary for other counseling recommendations.   No follow-ups on file.  Future Appointments  Date Time Provider Department Center  07/07/2024  7:15 AM [facility] SCHED ROOM [facility] None    Delon Emms, NP

## 2024-07-02 ENCOUNTER — Inpatient Hospital Stay (HOSPITAL_COMMUNITY): Admitting: Anesthesiology

## 2024-07-02 ENCOUNTER — Encounter (HOSPITAL_COMMUNITY): Payer: Self-pay | Admitting: Obstetrics and Gynecology

## 2024-07-02 DIAGNOSIS — O4423 Partial placenta previa NOS or without hemorrhage, third trimester: Secondary | ICD-10-CM

## 2024-07-02 DIAGNOSIS — O24424 Gestational diabetes mellitus in childbirth, insulin controlled: Secondary | ICD-10-CM

## 2024-07-02 DIAGNOSIS — Z3A38 38 weeks gestation of pregnancy: Secondary | ICD-10-CM

## 2024-07-02 DIAGNOSIS — O134 Gestational [pregnancy-induced] hypertension without significant proteinuria, complicating childbirth: Secondary | ICD-10-CM

## 2024-07-02 LAB — CBC
HCT: 35.7 % — ABNORMAL LOW (ref 36.0–46.0)
Hemoglobin: 11.9 g/dL — ABNORMAL LOW (ref 12.0–15.0)
MCH: 28.5 pg (ref 26.0–34.0)
MCHC: 33.3 g/dL (ref 30.0–36.0)
MCV: 85.6 fL (ref 80.0–100.0)
Platelets: 164 K/uL (ref 150–400)
RBC: 4.17 MIL/uL (ref 3.87–5.11)
RDW: 13.6 % (ref 11.5–15.5)
WBC: 19.3 K/uL — ABNORMAL HIGH (ref 4.0–10.5)
nRBC: 0 % (ref 0.0–0.2)

## 2024-07-02 LAB — GLUCOSE, CAPILLARY
Glucose-Capillary: 83 mg/dL (ref 70–99)
Glucose-Capillary: 61 mg/dL — ABNORMAL LOW (ref 70–99)

## 2024-07-02 LAB — SYPHILIS: RPR W/REFLEX TO RPR TITER AND TREPONEMAL ANTIBODIES, TRADITIONAL SCREENING AND DIAGNOSIS ALGORITHM: RPR Ser Ql: NONREACTIVE

## 2024-07-02 MED ORDER — ONDANSETRON HCL 4 MG PO TABS: 4.0000 mg | ORAL_TABLET | ORAL | Status: DC | PRN

## 2024-07-02 MED ORDER — ACETAMINOPHEN 325 MG PO TABS
650.0000 mg | ORAL_TABLET | ORAL | Status: DC | PRN
  Administered 2024-07-04: 650 mg via ORAL
  Filled 2024-07-02: qty 2

## 2024-07-02 MED ORDER — BENZOCAINE-MENTHOL 20-0.5 % EX AERO: 1.0000 | INHALATION_SPRAY | CUTANEOUS | Status: DC | PRN

## 2024-07-02 MED ORDER — TETANUS-DIPHTH-ACELL PERTUSSIS 5-2-15.5 LF-MCG/0.5 IM SUSP: 0.5000 mL | Freq: Once | INTRAMUSCULAR | Status: DC

## 2024-07-02 MED ORDER — ONDANSETRON HCL 4 MG/2ML IJ SOLN: 4.0000 mg | INTRAMUSCULAR | Status: DC | PRN

## 2024-07-02 MED ORDER — PRENATAL MULTIVITAMIN CH
1.0000 | ORAL_TABLET | Freq: Every day | ORAL | Status: DC
  Administered 2024-07-03 – 2024-07-04 (×2): 1 via ORAL
  Filled 2024-07-02 (×2): qty 1

## 2024-07-02 MED ORDER — SIMETHICONE 80 MG PO CHEW: 80.0000 mg | CHEWABLE_TABLET | ORAL | Status: DC | PRN

## 2024-07-02 MED ORDER — LIDOCAINE-EPINEPHRINE (PF) 1.5 %-1:200000 IJ SOLN
INTRAMUSCULAR | Status: DC | PRN
  Administered 2024-07-02: 5 mL via EPIDURAL

## 2024-07-02 MED ORDER — IBUPROFEN 600 MG PO TABS
600.0000 mg | ORAL_TABLET | Freq: Four times a day (QID) | ORAL | Status: DC
  Administered 2024-07-02 – 2024-07-04 (×8): 600 mg via ORAL
  Filled 2024-07-02 (×8): qty 1

## 2024-07-02 MED ORDER — DIBUCAINE (PERIANAL) 1 % EX OINT: 1.0000 | TOPICAL_OINTMENT | CUTANEOUS | Status: DC | PRN

## 2024-07-02 MED ORDER — DIPHENHYDRAMINE HCL 25 MG PO CAPS: 25.0000 mg | ORAL_CAPSULE | Freq: Four times a day (QID) | ORAL | Status: DC | PRN

## 2024-07-02 MED ORDER — ZOLPIDEM TARTRATE 5 MG PO TABS: 5.0000 mg | ORAL_TABLET | Freq: Every evening | ORAL | Status: DC | PRN

## 2024-07-02 MED ORDER — SENNOSIDES-DOCUSATE SODIUM 8.6-50 MG PO TABS
2.0000 | ORAL_TABLET | Freq: Every day | ORAL | Status: DC
  Administered 2024-07-03 – 2024-07-04 (×2): 2 via ORAL
  Filled 2024-07-02 (×2): qty 2

## 2024-07-02 MED ORDER — WITCH HAZEL-GLYCERIN EX PADS: 1.0000 | MEDICATED_PAD | CUTANEOUS | Status: DC | PRN

## 2024-07-02 MED ORDER — COCONUT OIL OIL: 1.0000 | TOPICAL_OIL | Status: DC | PRN

## 2024-07-02 NOTE — Anesthesia Postprocedure Evaluation (Signed)
 Anesthesia Post Note  Patient: Ashley Watson  Procedure(s) Performed: AN AD HOC LABOR EPIDURAL     Patient location during evaluation: Mother Baby Anesthesia Type: Epidural Level of consciousness: awake and alert and oriented Pain management: satisfactory to patient Vital Signs Assessment: post-procedure vital signs reviewed and stable Respiratory status: respiratory function stable Cardiovascular status: stable Postop Assessment: no headache, no backache, epidural receding, patient able to bend at knees, no signs of nausea or vomiting, adequate PO intake and able to ambulate Anesthetic complications: no   No notable events documented.  Last Vitals:  Vitals:   07/02/24 1420 07/02/24 1520  BP: 136/84 133/82  Pulse: (!) 102 92  Resp: 16 16  Temp: 37.2 C 37.2 C  SpO2: 99% 98%    Last Pain:  Vitals:   07/02/24 1805  TempSrc:   PainSc: 2    Pain Goal:                   Hildegard KATHEE Ang, CRNA

## 2024-07-02 NOTE — Anesthesia Procedure Notes (Signed)
 Epidural Patient location during procedure: OB Start time: 07/02/2024 2:30 AM End time: 07/02/2024 2:35 AM  Staffing Anesthesiologist: Dorethea Cordella SQUIBB, DO Performed: anesthesiologist   Preanesthetic Checklist Completed: patient identified, IV checked, site marked, risks and benefits discussed, surgical consent, monitors and equipment checked, pre-op evaluation and timeout performed  Epidural Patient position: sitting Prep: ChloraPrep Patient monitoring: heart rate, continuous pulse ox and blood pressure Approach: midline Location: L4-L5 Injection technique: LOR saline  Needle:  Needle type: Tuohy  Needle gauge: 17 G Needle length: 9 cm Needle insertion depth: 5 cm Catheter type: closed end flexible Catheter size: 20 Guage Catheter at skin depth: 11 cm Test dose: negative and 1.5% lidocaine  with Epi 1:200 K  Assessment Events: blood not aspirated, no cerebrospinal fluid, injection not painful, no injection resistance and no paresthesia  Additional Notes  Patient identified. Risks/Benefits/Options discussed with patient including but not limited to bleeding, infection, nerve damage, paralysis, failed block, incomplete pain control, headache, blood pressure changes, nausea, vomiting, reactions to medications, itching and postpartum back pain. Confirmed with bedside nurse the patient's most recent platelet count. Confirmed with patient that they are not currently taking any anticoagulation, have any bleeding history or any family history of bleeding disorders. Patient expressed understanding and wished to proceed. All questions were answered. Sterile technique was used throughout the entire procedure. Please see nursing notes for vital signs. Test dose was given through epidural catheter and negative prior to continuing to dose epidural or start infusion. Warning signs of high block given to the patient including shortness of breath, tingling/numbness in hands, complete motor block,  or any concerning symptoms with instructions to call for help. Patient was given instructions on fall risk and not to get out of bed. All questions and concerns addressed with instructions to call with any issues or inadequate analgesia.    Reason for block:procedure for pain

## 2024-07-02 NOTE — Discharge Summary (Signed)
 Postpartum Discharge Summary    Patient Name: Ashley Watson DOB: 1991/03/24 MRN: [id number]  Date of admission: 07/01/2024 Delivery date:07/02/2024 Delivering provider: CLAUDENE LUM BRAVO Date of discharge: 07/04/2024  Admitting diagnosis: Labor and delivery, indication for care [O75.9] Intrauterine pregnancy: [redacted]w[redacted]d     Secondary diagnosis:  Principal Problem:   Labor and delivery, indication for care Active Problems:   Gestational diabetes mellitus in pregnancy, insulin  controlled   Marginal insertion of umbilical cord affecting management of mother   Gestational hypertension, third trimester   Precipitous delivery  Additional problems:  Patient Active Problem List   Diagnosis Date Noted   Precipitous delivery 07/02/2024   Gestational hypertension, third trimester 07/01/2024   Labor and delivery, indication for care 07/01/2024   Marginal insertion of umbilical cord affecting management of mother 02/28/2024   History of prior pregnancy with IUGR newborn 01/31/2024   History of vacuum extraction assisted delivery 01/31/2024   Supervision of high risk pregnancy, antepartum 01/04/2024   Gestational diabetes mellitus in pregnancy, insulin  controlled 07/22/2021   History of severe pre-eclampsia 06/22/2021       Discharge diagnosis: Term Pregnancy Delivered, Gestational Hypertension, and GDM A2                                              Post partum procedures:none Augmentation: Pitocin  and Cytotec  Complications: None  Hospital course: Induction of Labor With Vaginal Delivery   33 y.o. yo [id number] at [redacted]w[redacted]d was admitted to the hospital 07/01/2024 for induction of labor.  Indication for induction: Gestational hypertension and A2 DM.  Patient had an labor course complicated by Precipitous delivery  Membrane Rupture Time/Date:  ,   Delivery Method:Vaginal, Spontaneous Operative Delivery:N/A Episiotomy: None Lacerations:  2nd degree Details of delivery can be found in separate  delivery note.  Patient had a postpartum course complicated bynone. Patient is discharged home 07/04/24.  Newborn Data: Birth date:07/02/2024 Birth time:11:41 AM Gender:Female Living status:Living Apgars:1 ,9  Weight:2900 g  Magnesium  Sulfate received: No BMZ received: No Rhophylac:N/A MMR:N/A T-DaP:Given prenatally Flu: Yes given 06/25/24 RSV Vaccine received: No Transfusion:No  Immunizations received: Immunization History  Administered Date(s) Administered   HPV Quadrivalent 05/07/2012   Influenza Split 04/19/2012   Influenza, Seasonal, Injecte, Preservative Fre 06/25/2024   Influenza,inj,Quad PF,6+ Mos 04/27/2021   Tdap 10/07/2017, 04/27/2021, 06/25/2024    Physical exam  Vitals:   07/03/24 1431 07/03/24 1718 07/03/24 2232 07/04/24 0516  BP: 137/87 (!) 150/84 129/85 120/72  Pulse: 67  88 90  Resp:   18 18  Temp:   98 F (36.7 C) 98.4 F (36.9 C)  TempSrc:   Oral Oral  SpO2:   97% 98%  Weight:      Height:       General: alert and cooperative Lochia: appropriate Uterine Fundus: firm Incision: N/A DVT Evaluation: No significant calf/ankle edema.  Labs: Lab Results  Component Value Date   WBC 16.5 (H) 07/03/2024   HGB 10.5 (L) 07/03/2024   HCT 32.0 (L) 07/03/2024   MCV 87.0 07/03/2024   PLT 149 (L) 07/03/2024      Latest Ref Rng & Units 07/01/2024    4:31 PM  CMP  Glucose 70 - 99 mg/dL 843   BUN 6 - 20 mg/dL 21   Creatinine 9.55 - 1.00 mg/dL 9.19   Sodium 864 - 854 mmol/L 132  Potassium 3.5 - 5.1 mmol/L 4.0   Chloride 98 - 111 mmol/L 103   CO2 22 - 32 mmol/L 22   Calcium 8.9 - 10.3 mg/dL 8.8   Total Protein 6.5 - 8.1 g/dL 6.0   Total Bilirubin 0.0 - 1.2 mg/dL 0.4   Alkaline Phos 38 - 126 U/L 101   AST 15 - 41 U/L 25   ALT 0 - 44 U/L 22    Edinburgh Score:    07/03/2024   10:30 PM  Edinburgh Postnatal Depression Scale Screening Tool  I have been able to laugh and see the funny side of things. 0  I have looked forward with enjoyment to  things. 0  I have blamed myself unnecessarily when things went wrong. 0  I have been anxious or worried for no good reason. 0  I have felt scared or panicky for no good reason. 0  Things have been getting on top of me. 0  I have been so unhappy that I have had difficulty sleeping. 0  I have felt sad or miserable. 0  I have been so unhappy that I have been crying. 0  The thought of harming myself has occurred to me. 0  Edinburgh Postnatal Depression Scale Total 0   Edinburgh Postnatal Depression Scale Total: 0   After visit meds:  Allergies as of 07/04/2024       Reactions   Latex Rash        Medication List     STOP taking these medications    Accu-Chek Guide w/Device Kit   Accu-Chek Softclix Lancets lancets   aspirin  EC 81 MG tablet   glucose blood test strip   insulin  glargine 100 UNIT/ML Solostar Pen Commonly known as: LANTUS    Insulin  Pen Needle 31G X 5 MM Misc   metFORMIN  500 MG tablet Commonly known as: GLUCOPHAGE        TAKE these medications    acetaminophen  500 MG tablet Commonly known as: TYLENOL  Take 2 tablets (1,000 mg total) by mouth every 6 (six) hours as needed for moderate pain (pain score 4-6).   furosemide  20 MG tablet Commonly known as: LASIX  Take 1 tablet (20 mg total) by mouth daily.   ibuprofen  600 MG tablet Commonly known as: ADVIL  Take 1 tablet (600 mg total) by mouth every 6 (six) hours as needed for moderate pain (pain score 4-6).   NIFEdipine  30 MG 24 hr tablet Commonly known as: PROCARDIA -XL/NIFEDICAL-XL Take 1 tablet (30 mg total) by mouth daily.   potassium chloride  SA 20 MEQ tablet Commonly known as: KLOR-CON  M Take 1 tablet (20 mEq total) by mouth daily.   PRENATAL PO Take 1 tablet by mouth daily.   senna-docusate 8.6-50 MG tablet Commonly known as: Senokot-S Take 2 tablets by mouth at bedtime as needed for mild constipation or moderate constipation.         Discharge home in stable condition Infant  Feeding: Breast Infant Disposition:home with mother Discharge instruction: per After Visit Summary and Postpartum booklet. Activity: Advance as tolerated. Pelvic rest for 6 weeks.  Diet: routine diet Future Appointments: Future Appointments  Date Time Provider Department Center  07/30/2024  1:30 PM Constant, Winton, MD [facility]   Follow up Visit:  Follow-up Information     [facility] at [facility] Follow up.   Specialty: Obstetrics and Gynecology Contact information: [address] [place]  [address] [phone]  Please schedule this patient for a In person postpartum visit in 6 weeks with the following provider: Any provider. Additional Postpartum F/U:2 hour GTT and BP check 1 week  High risk pregnancy complicated by: GDM and HTN Delivery mode:  Vaginal, Spontaneous Anticipated Birth Control:  Vasectomy, needs bridge   Message sent on 07/02/24  07/04/2024 Barabara Maier, DO

## 2024-07-02 NOTE — Progress Notes (Signed)
 Patient ID: Ashley Watson, female   DOB: February 12, 1991, 33 y.o.   MRN: [id number]  Subjective: -Nurse call reports patient with prolonged decel. Provider to bedside and patient on hands and knees.   Objective: BP 126/79   Pulse 84   Temp 98.1 F (36.7 C) (Oral)   Resp 16   Ht 5' 4 (1.626 m)   Wt 89.2 kg   LMP 10/07/2023   SpO2 99%   BMI 33.75 kg/m  No intake/output data recorded. No intake/output data recorded.  Fetal Monitoring: FHT: 100 bpm, Mod Var, +Prolonged Decel, +Accels UC: Q20min    Vaginal Exam: SVE:   Dilation: 3.5 Effacement (%): 80 Station: -2 Exam by:: Darryle Kendall, RN Membranes: rupture time unknown Internal Monitors: None  Augmentation/Induction: Pitocin : Discontinued Cytotec : S/P  Assessment:  IUP at 38.2 weeks Cat II FT  IOL   Plan: -Pitocin  discontinued.  -Cat II FT resolving with position changes. -Dr. FREDRIK Buddle at bedside.  -Will allow fetal HR to recover considering rapid cervical change.  -Plan to recheck/reassess in one hour.   Harlene LITTIE Synthia LAFE, CNM Advanced Practice Provider, Center for [facility] 07/02/2024, 6:43 AM

## 2024-07-02 NOTE — Progress Notes (Addendum)
 Ashley Watson is a 33 y.o. G3P1011 at [redacted]w[redacted]d by ultrasound admitted for induction of labor due to gestational Hypertension A2GDM and Marginal cord insertion. Hx VAVD with a 4lbs baby.   Subjective: Patient not feeling consistent pressure with contractions. Pain is well controlled with epidural. FOB at bedside and very supportive.   Objective: BP 111/64   Pulse 70   Temp (!) 97 F (36.1 C) (Axillary)   Resp 16   Ht 5' 4 (1.626 m)   Wt 89.2 kg   LMP 10/07/2023   SpO2 100%   BMI 33.75 kg/m  No intake/output data recorded. Total I/O In: -  Out: 700 [Urine:700]  FHT:  FHR: 120 bpm, variability: moderate,  accelerations:  Present,  decelerations:  Present early and variable decels with contractions UC:   regular, every 4-5 minutes SVE:   Dilation: 10 Effacement (%): 100 Station: Plus 1 Exam by:: Ashley Watson CNM @ 1026  Labs: Lab Results  Component Value Date   WBC 15.3 (H) 07/01/2024   HGB 11.2 (L) 07/01/2024   HCT 34.1 (L) 07/01/2024   MCV 87.0 07/01/2024   PLT 173 07/01/2024   CBG (last 3)  Recent Labs    07/01/24 2305 07/02/24 0305 07/02/24 0722  GLUCAP 84 83 61*   Patient Vitals for the past 24 hrs:  BP Temp Temp src Pulse Resp SpO2 Height Weight  07/02/24 1031 128/83 -- -- 84 -- 100 % -- --  07/02/24 1001 111/64 -- -- 70 -- -- -- --  07/02/24 1000 -- -- -- -- -- 100 % -- --  07/02/24 0931 127/87 -- -- 92 -- -- -- --  07/02/24 0900 130/88 -- -- 80 -- 99 % -- --  07/02/24 0831 121/85 -- -- 88 -- 100 % -- --  07/02/24 0826 -- -- -- -- -- 100 % -- --  07/02/24 0800 115/86 -- -- 88 -- 100 % -- --  07/02/24 0735 -- -- -- -- -- 100 % -- --  07/02/24 0731 112/77 -- -- 85 -- -- -- --  07/02/24 0718 124/80 -- -- 85 -- -- -- --  07/02/24 0715 -- (!) 97 F (36.1 C) Axillary -- -- -- -- --  07/02/24 0630 (!) 124/91 -- -- (!) 105 16 -- -- --  07/02/24 0600 126/79 -- -- 84 -- 99 % -- --  07/02/24 0530 128/77 -- -- 78 -- -- -- --  07/02/24 0515 -- -- -- -- -- 99 % --  --  07/02/24 0510 -- -- -- -- -- 99 % -- --  07/02/24 0505 -- -- -- -- -- 99 % -- --  07/02/24 0500 121/66 -- -- 65 16 98 % -- --  07/02/24 0430 125/72 -- -- 70 16 99 % -- --  07/02/24 0400 124/72 -- -- 74 -- 100 % -- --  07/02/24 0350 -- -- -- -- -- 99 % -- --  07/02/24 0345 -- -- -- -- -- 100 % -- --  07/02/24 0342 127/76 -- -- 71 16 -- -- --  07/02/24 0340 -- -- -- -- -- 99 % -- --  07/02/24 0335 -- -- -- -- -- 99 % -- --  07/02/24 0330 104/65 -- -- 86 16 99 % -- --  07/02/24 0328 119/87 -- -- 95 -- -- -- --  07/02/24 0325 -- -- -- -- -- 98 % -- --  07/02/24 0315 -- -- -- -- -- 98 % -- --  07/02/24 0310 -- -- -- -- --  99 % -- --  07/02/24 0305 128/80 -- -- 92 -- 98 % -- --  07/02/24 0300 121/71 -- -- 89 -- 98 % -- --  07/02/24 0255 126/71 -- -- 80 16 98 % -- --  07/02/24 0250 125/77 -- -- 79 -- 99 % -- --  07/02/24 0245 130/78 -- -- 80 -- 98 % -- --  07/02/24 0240 (!) 140/79 -- -- 85 -- 100 % -- --  07/02/24 0235 132/79 -- -- 89 -- 98 % -- --  07/02/24 0044 124/70 -- -- 71 16 -- -- --  07/01/24 2150 (!) 146/79 -- -- 82 16 -- -- --  07/01/24 1931 121/65 98.1 F (36.7 C) Oral 77 18 -- -- --  07/01/24 1616 139/79 98.6 F (37 C) Oral 100 16 95 % 5' 4 (1.626 m) 89.2 kg    Assessment / Plan: Induction of labor due to gestational hypertension,  progressing well s/p on pitocin  and Cytotec . Pit was discontinued at ~0342 and patient progressed to complete. Pit remains off at this time. Attempted to Labor down for 1 hour, minimal progression with fetal station. CNM at bedside to assess for positioning. OP with fetus facing maternal right. Recommended to start pushing.   Labor: Progressing normally. Plan to start pushing at this time . gHTN:  BPs stable at this time.  Fetal Wellbeing:  Category II Pain Control:  Epidural I/D:  GBS negative and patient remains afebrile.  Anticipated MOD:  hopeful for vaginal birth.   Ashley Watson, CNM 07/02/2024, 10:26 AM  Consulted Dr. Lola for  fetal position eval. MD and CNM to bedside. MD assessed for fetal positioning and found to be ROA at this time. Recommended to start pushing. CNM remained at bedside while pushing through several contractions. Patient turned to Maternal right for prolonged contraction, and fetal tracing improved. Plan to push on patient side for a while.   Ashley Watson) [place], MSN, CNM  Center for [facility]  07/02/2024 11:03 AM

## 2024-07-02 NOTE — Lactation Note (Signed)
 This note was copied from a baby's chart. Lactation Consultation Note  Patient Name: Ashley Watson Date: 07/02/2024 Age:33 hours  Reason for consult: Early term 37-38.6wks;Maternal endocrine disorder;Initial assessment  P2, [redacted]w[redacted]d, GDM ( insulin ), hypoglycemia  Initial LC visit to see mother and baby Ashley General at request of nursing staff due to baby's blood sugar of 23. Baby received glucose gel while skin to skin and LC in to assist with breastfeeding prior to supplementation.   Hand expression taught to mom. A glistening of colostrum was noted. Baby latched on and off with LC assist for 1 minutes. Baby was not interested and sleepy. Baby skin to skin. Mother's first choice to supplement was donor breast milk but none was available. Parents were also receptive to formula.   Dad bottle fed baby 5 ml. Mother was receptive to pumping. Instructed mother on the use, frequency, cleaning of breast pump and storage of breast milk. Mother fitted with 18 mm flanges. Mother was very familiar with pumping. She reports having this same experience with her other son.Instructed to pump every 3 hrs for 15 min in initiate mode. If milk is collected, requested to contact nurse for assistance with syringe feeding EBM.   Mother provided a handout of O/P services, breastfeeding support groups, community resources, and phone # for post-discharge questions.     Maternal Data Has patient been taught Hand Expression?: Yes Does the patient have breastfeeding experience prior to this delivery?: Yes How long did the patient breastfeed?: pumped for 3 months, ETI, baby weighed 4 lbs  Feeding Mother's Current Feeding Choice: Breast Milk and Formula (formula initiated due to low BS)  LATCH Score Latch: Repeated attempts needed to sustain latch, nipple held in mouth throughout feeding, stimulation needed to elicit sucking reflex.  Audible Swallowing: None  Type of Nipple: Everted at rest and after  stimulation  Comfort (Breast/Nipple): Soft / non-tender  Hold (Positioning): Assistance needed to correctly position infant at breast and maintain latch.  LATCH Score: 6   Lactation Tools Discussed/Used Tools: Pump;Flanges;Bottle Flange Size: 18 Breast pump type: Manual;Double-Electric Breast Pump Pump Education: Setup, frequency, and cleaning;Milk Storage Reason for Pumping: stimulate milk production, low blood sugar, ETI Pumping frequency: every 3 hrs for 15 minutes  Interventions Interventions: Breast feeding basics reviewed;Assisted with latch;Skin to skin;Hand express;Breast compression;Adjust position;Support pillows;DEBP;Education;LC Services brochure;CDC milk storage guidelines;CDC Guidelines for Breast Pump Cleaning  Discharge Pump: DEBP;Manual;Personal (Medela)  Consult Status Consult Status: Follow-up Date: 07/03/24 Follow-up type: In-patient    Joshua Line M 07/02/2024, 4:50 PM

## 2024-07-02 NOTE — Progress Notes (Signed)
 Patient ID: Ashley Watson, female   DOB: October 26, 1990, 33 y.o.   MRN: [id number]   Subjective: Nurse reports patient with prolonged decels.  Pitocin  discontinued. Strip and Chart Reviewed.  Objective:  Vitals:   07/02/24 0330 07/02/24 0335 07/02/24 0340 07/02/24 0342  BP: 104/65   127/76  Pulse: 86   71  Resp: 16     Temp:      TempSrc:      SpO2: 99% 99% 99%   Weight:      Height:        FHR: 130 bpm, Mod Var, +Decels, -Accels UC:Q1-23min  Assessment: IUP at [redacted]w[redacted]d Cat II FT IOL s/t GHTN GDMA2  Plan: -Resolution of Cat II FT with discontinuation of pitocin .  -Plan to monitor and restart pitocin  in 1.5-2 hrs. -Dr. FREDRIK Buddle to be updated on patient status.   Ashley Watson, CNM 07/02/2024 3:50 AM

## 2024-07-02 NOTE — Anesthesia Preprocedure Evaluation (Signed)
 Anesthesia Evaluation  Patient identified by MRN, date of birth, ID band Patient awake    Reviewed: Allergy & Precautions, NPO status , Patient's Chart, lab work & pertinent test results  Airway Mallampati: II  TM Distance: >3 FB Neck ROM: Full    Dental no notable dental hx.    Pulmonary neg pulmonary ROS   Pulmonary exam normal        Cardiovascular hypertension,  Rhythm:Regular Rate:Normal     Neuro/Psych  Headaches  negative psych ROS   GI/Hepatic negative GI ROS, Neg liver ROS,,,  Endo/Other  diabetes, Gestational    Renal/GU negative Renal ROS  negative genitourinary   Musculoskeletal negative musculoskeletal ROS (+)    Abdominal Normal abdominal exam  (+)   Peds  Hematology Lab Results      Component                Value               Date                      WBC                      15.3 (H)            07/01/2024                HGB                      11.2 (L)            07/01/2024                HCT                      34.1 (L)            07/01/2024                MCV                      87.0                07/01/2024                PLT                      173                 07/01/2024              Anesthesia Other Findings   Reproductive/Obstetrics (+) Pregnancy                              Anesthesia Physical Anesthesia Plan  ASA: 2  Anesthesia Plan: Epidural   Post-op Pain Management:    Induction:   PONV Risk Score and Plan: 2 and Treatment may vary due to age or medical condition  Airway Management Planned: Natural Airway  Additional Equipment: None  Intra-op Plan:   Post-operative Plan:   Informed Consent: I have reviewed the patients History and Physical, chart, labs and discussed the procedure including the risks, benefits and alternatives for the proposed anesthesia with the patient or authorized representative who has indicated his/her understanding  and acceptance.     Dental advisory given  Plan Discussed with:   Anesthesia  Plan Comments:         Anesthesia Quick Evaluation

## 2024-07-03 ENCOUNTER — Encounter (HOSPITAL_COMMUNITY): Payer: Self-pay | Admitting: Obstetrics and Gynecology

## 2024-07-03 LAB — CBC
HCT: 32 % — ABNORMAL LOW (ref 36.0–46.0)
Hemoglobin: 10.5 g/dL — ABNORMAL LOW (ref 12.0–15.0)
MCH: 28.5 pg (ref 26.0–34.0)
MCHC: 32.8 g/dL (ref 30.0–36.0)
MCV: 87 fL (ref 80.0–100.0)
Platelets: 149 K/uL — ABNORMAL LOW (ref 150–400)
RBC: 3.68 MIL/uL — ABNORMAL LOW (ref 3.87–5.11)
RDW: 13.8 % (ref 11.5–15.5)
WBC: 16.5 K/uL — ABNORMAL HIGH (ref 4.0–10.5)
nRBC: 0 % (ref 0.0–0.2)

## 2024-07-03 MED ORDER — NIFEDIPINE ER OSMOTIC RELEASE 30 MG PO TB24
30.0000 mg | ORAL_TABLET | Freq: Once | ORAL | Status: AC
  Administered 2024-07-03: 30 mg via ORAL
  Filled 2024-07-03: qty 1

## 2024-07-03 MED ORDER — POTASSIUM CHLORIDE CRYS ER 20 MEQ PO TBCR
20.0000 meq | EXTENDED_RELEASE_TABLET | Freq: Every day | ORAL | Status: DC
  Administered 2024-07-04: 20 meq via ORAL
  Filled 2024-07-03: qty 1

## 2024-07-03 MED ORDER — FUROSEMIDE 20 MG PO TABS
20.0000 mg | ORAL_TABLET | Freq: Every day | ORAL | Status: DC
  Administered 2024-07-03 – 2024-07-04 (×2): 20 mg via ORAL
  Filled 2024-07-03 (×2): qty 1

## 2024-07-03 NOTE — Patient Instructions (Signed)
 If you are interested in an outpatient lactation consultation -- available in-office or virtually -- please reach out to us  at:  [facility] (First Floor) ?? [address], [place], [place]  ?? [phone] Please leave a message on our lactation voicemail box. We welcome any lactation-related questions or concerns -- our team is here to support you and your baby.  Lactation Support Groups Join us  at: [facility] ?? Tuesdays, 10:00 AM - 12:00 PM ?? [address], [place] [employer], [place]  Lactating parents and lap babies are welcome!  ?? [email or web address]  ?? [email or web address] -------------  Si est interesado en una consulta ambulatoria de lactancia, disponible en el consultorio o virtualmente, comunquese con nosotros en:  [facility] ?? [address], [place], [place]  ?? [phone] Por favor, deje un mensaje en nuestro buzn de voz de lactancia. Estamos aqu para responder cualquier pregunta o inquietud relacionada con la lactancia y para apoyarle a usted y a su beb.  Grupos de Apoyo para la Lactancia nase a nosotros en: [facility] ?? Martes, de 10:00 a. m. a 12:00 p. m. ?? [address], [place], [place]  Se admiten madres lactantes y bebs en regazo.  ?? [email or web address]  ?? [email or web address]      Ashley Watson, [facility] Center for [facility]

## 2024-07-03 NOTE — Progress Notes (Signed)
 RN notified CNM that BP was elevated. Given pt hx of gHTN in previous pregnancy and elevated BPs now, plan to start patient on Procardia  daily and a short 5 day course of lasix. Patient currently denies any preE symptoms.   Patient Vitals for the past 24 hrs:  BP Temp Temp src Pulse Resp SpO2  07/03/24 1431 137/87 -- -- 67 -- --  07/03/24 1328 (!) 140/80 98 F (36.7 C) Oral 73 18 --  07/03/24 0440 132/83 97.9 F (36.6 C) Oral 81 18 100 %  07/02/24 2353 133/84 98.5 F (36.9 C) Oral 72 18 100 %  07/02/24 1953 130/70 98.1 F (36.7 C) Oral 80 18 97 %    BP 137/87 (BP Location: Left Arm) Comment: CNM notified via secure chat  Pulse 67   Temp 98 F (36.7 C) (Oral)   Resp 18   Ht 5' 4 (1.626 m)   Wt 89.2 kg   LMP 10/07/2023   SpO2 100%   Breastfeeding Unknown   BMI 33.75 kg/m    Ashley Watson) Emilio, MSN, CNM  Center for [facility]  07/03/2024 4:32 PM

## 2024-07-03 NOTE — Progress Notes (Signed)
 POSTPARTUM PROGRESS NOTE  Post Partum Day 1  Subjective:  Ashley Watson is a 33 y.o. H6E7987 s/p SVD at [redacted]w[redacted]d.  She reports she is doing well. No acute events overnight. She denies any problems with ambulating, voiding or po intake. Denies nausea or vomiting.  Pain is well controlled.  Lochia is minimal.  Objective: Blood pressure 132/83, pulse 81, temperature 97.9 F (36.6 C), temperature source Oral, resp. rate 18, height 5' 4 (1.626 m), weight 89.2 kg, last menstrual period 10/07/2023, SpO2 100%, unknown if currently breastfeeding.  Physical Exam:  General: alert, cooperative and no distress Chest: no respiratory distress Heart:regular rate, distal pulses intact Abdomen: soft, nontender,  Uterine Fundus: firm, appropriately tender DVT Evaluation: No calf swelling or tenderness Extremities: No edema Skin: warm, dry  Recent Labs    07/02/24 1312 07/03/24 0522  HGB 11.9* 10.5*  HCT 35.7* 32.0*    Assessment/Plan: Ashley Watson is a 33 y.o. H6E7987 s/p SVD at [redacted]w[redacted]d   PPD#1 - Doing well  Routine postpartum care  Contraception: partner vasectomy Feeding: Breast Dispo: Plan for discharge tomorrow.   LOS: 2 days   Barkley Angles, MD OB Fellow, Faculty Practice [facility], Center for [employer]

## 2024-07-04 ENCOUNTER — Other Ambulatory Visit (HOSPITAL_COMMUNITY): Payer: Self-pay

## 2024-07-04 MED ORDER — SENNOSIDES-DOCUSATE SODIUM 8.6-50 MG PO TABS
2.0000 | ORAL_TABLET | Freq: Every evening | ORAL | 0 refills | Status: DC | PRN
  Filled 2024-07-04: qty 30, 15d supply, fill #0

## 2024-07-04 MED ORDER — IBUPROFEN 600 MG PO TABS
600.0000 mg | ORAL_TABLET | Freq: Four times a day (QID) | ORAL | 0 refills | Status: DC | PRN
  Filled 2024-07-04: qty 30, 8d supply, fill #0

## 2024-07-04 MED ORDER — ACETAMINOPHEN 500 MG PO TABS
1000.0000 mg | ORAL_TABLET | Freq: Four times a day (QID) | ORAL | 0 refills | Status: DC | PRN
  Filled 2024-07-04: qty 30, 4d supply, fill #0

## 2024-07-04 MED ORDER — NIFEDIPINE ER 30 MG PO TB24
30.0000 mg | ORAL_TABLET | Freq: Every day | ORAL | 11 refills | Status: DC
  Filled 2024-07-04: qty 30, 30d supply, fill #0

## 2024-07-04 MED ORDER — POTASSIUM CHLORIDE CRYS ER 20 MEQ PO TBCR
20.0000 meq | EXTENDED_RELEASE_TABLET | Freq: Every day | ORAL | 0 refills | Status: DC
  Filled 2024-07-04: qty 3, 3d supply, fill #0

## 2024-07-04 MED ORDER — FUROSEMIDE 20 MG PO TABS
20.0000 mg | ORAL_TABLET | Freq: Every day | ORAL | 0 refills | Status: DC
  Filled 2024-07-04: qty 3, 3d supply, fill #0

## 2024-07-04 NOTE — Discharge Instructions (Signed)
-   think about contraception (birth control) to use between now and when your partner's vasectomy and after procedure tests are done

## 2024-07-04 NOTE — Lactation Note (Signed)
 This note was copied from a baby's chart. Lactation Consultation Note  Patient Name: Ashley Watson Date: 07/04/2024 Age:33 hours, P2  Reason for consult: Follow-up assessment;Early term 37-38.6wks;Exclusive pumping and bottle feeding As LC entered the room mom getting ready to pump.  Per mom has pumped x 5-6 in the 24 hours with a 3 ml at the most.  LC reviewed importance of consistent pumping 8-10 times a day around the clock and the importance of the 1st 2 weeks of pumping to establish the milk supply.  LC reviewed engorgement prevention and tx.  See below for information with LC O/P. Mom declined for today and has the number.   Maternal Data    Feeding Mother's Current Feeding Choice: Breast Milk and Formula Nipple Type: Slow - flow    Lactation Tools Discussed/Used Tools: Pump;Flanges Flange Size: 18 Breast pump type: Double-Electric Breast Pump;Manual Pump Education: Setup, frequency, and cleaning;Milk Storage Pumped volume: 3 mL (per mom)  Interventions Interventions: Breast feeding basics reviewed;Education;Hand pump;DEBP;LC Services brochure;CDC milk storage guidelines;CDC Guidelines for Breast Pump Cleaning  Discharge Discharge Education: Engorgement and breast care;Warning signs for feeding baby;Outpatient recommendation (LC noted from the progress notes mom declined LC O/P with the O/P LC and this LC offered to request and per mom will call if needed.) Pump: DEBP;Manual;Personal  Consult Status Consult Status: Complete Date: 07/04/24    Rollene Jenkins Fiedler 07/04/2024, 9:56 AM

## 2024-07-05 ENCOUNTER — Inpatient Hospital Stay (HOSPITAL_COMMUNITY)
Admission: AD | Admit: 2024-07-05 | Discharge: 2024-07-05 | Disposition: A | Discharge status: Home / Self Care | Attending: Obstetrics and Gynecology | Admitting: Obstetrics and Gynecology

## 2024-07-05 ENCOUNTER — Other Ambulatory Visit: Payer: Self-pay

## 2024-07-05 DIAGNOSIS — R519 Headache, unspecified: Secondary | ICD-10-CM

## 2024-07-05 DIAGNOSIS — O9089 Other complications of the puerperium, not elsewhere classified: Secondary | ICD-10-CM

## 2024-07-05 DIAGNOSIS — R7989 Other specified abnormal findings of blood chemistry: Secondary | ICD-10-CM | POA: Diagnosis not present

## 2024-07-05 LAB — CBC WITH DIFFERENTIAL/PLATELET
Basophils Absolute: 0 K/uL (ref 0.0–0.1)
Basophils Relative: 0 %
Eosinophils Absolute: 0.7 K/uL — ABNORMAL HIGH (ref 0.0–0.5)
Eosinophils Relative: 4 %
HCT: 37.5 % (ref 36.0–46.0)
Hemoglobin: 12.3 g/dL (ref 12.0–15.0)
Lymphocytes Relative: 30 %
Lymphs Abs: 4.9 K/uL — ABNORMAL HIGH (ref 0.7–4.0)
MCH: 28.1 pg (ref 26.0–34.0)
MCHC: 32.8 g/dL (ref 30.0–36.0)
MCV: 85.8 fL (ref 80.0–100.0)
Monocytes Absolute: 0.5 K/uL (ref 0.1–1.0)
Monocytes Relative: 3 %
Neutro Abs: 10.3 K/uL — ABNORMAL HIGH (ref 1.7–7.7)
Neutrophils Relative %: 63 %
Platelets: 272 K/uL (ref 150–400)
RBC: 4.37 MIL/uL (ref 3.87–5.11)
RDW: 13.9 % (ref 11.5–15.5)
WBC: 16.3 K/uL — ABNORMAL HIGH (ref 4.0–10.5)
nRBC: 0 % (ref 0.0–0.2)

## 2024-07-05 LAB — COMPREHENSIVE METABOLIC PANEL WITH GFR
ALT: 48 U/L — ABNORMAL HIGH (ref 0–44)
AST: 60 U/L — ABNORMAL HIGH (ref 15–41)
Albumin: 3.1 g/dL — ABNORMAL LOW (ref 3.5–5.0)
Alkaline Phosphatase: 100 U/L (ref 38–126)
Anion gap: 8 (ref 5–15)
BUN: 20 mg/dL (ref 6–20)
CO2: 23 mmol/L (ref 22–32)
Calcium: 9.8 mg/dL (ref 8.9–10.3)
Chloride: 104 mmol/L (ref 98–111)
Creatinine, Ser: 0.78 mg/dL (ref 0.44–1.00)
GFR, Estimated: >60 mL/min (ref >60)
Glucose, Bld: 120 mg/dL — ABNORMAL HIGH (ref 70–99)
Potassium: 4 mmol/L (ref 3.5–5.1)
Sodium: 135 mmol/L (ref 135–145)
Total Bilirubin: 0.3 mg/dL (ref 0.0–1.2)
Total Protein: 7 g/dL (ref 6.5–8.1)

## 2024-07-05 MED ORDER — ACETAMINOPHEN-CAFFEINE 500-65 MG PO TABS
2.0000 | ORAL_TABLET | Freq: Once | ORAL | Status: AC
  Administered 2024-07-05: 2 via ORAL
  Filled 2024-07-05: qty 2

## 2024-07-05 NOTE — MAU Provider Note (Signed)
 History     CSN: [id number]  Arrival date and time: 07/05/24 1541   Event Date/Time   First Provider Initiated Contact with Patient 07/05/24 1559      Chief Complaint  Patient presents with   Headache   Ashley Watson , a  33 y.o. [id number] at 3 days PP presents to MAU with complaints of an on-going headache. Patient states that she has had this headache for the last day or so. Reports pain down the right side of her neck and up into the front part of her head. Without medication she reports pain as a 8/10 and states that with meds its more like a 6/10. Last took Ibuprofen  at 1pm. She states that she is not sleeping but took a 2 hour nap. Denies eating since this morning but states that she is drinking water. She denies epigastric pain and visual disturbances. She believes that she may have a tension headache from looking down and watching her pumps during lactation.   During post-partum was started on procardia  and lasix . Patient endorses taking her procardia  this morning.           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    Past Surgical History:  Procedure Laterality Date   arm surgery 2024 Right 09/13/2022   LAPAROSCOPIC UNILATERAL SALPINGECTOMY Left 07/08/2020   Procedure: LAPARSCOPIC left salpingectomy, chromotubation,  lysis of adhesions, and electrosurgical excision and vaporization of ovarian and peritoneal endometriotic lesions;  Surgeon: Yalcinkaya, Tamer, MD;  Location: [facility];  Service: Gynecology;  Laterality: Left;   TOOTH  EXTRACTION  2016    Family History  Problem Relation Age of Onset   Diabetes Father    Hypertension Father    Gout Father    Migraines Father    Seizures Maternal Grandfather     Social History   Tobacco Use   Smoking status: Never   Smokeless tobacco: Never  Vaping Use   Vaping status: Never Used  Substance Use Topics   Alcohol use: Not Currently    Alcohol/week: 1.0 standard drink of alcohol    Types: 1 Glasses of wine per week    Comment: occ x 1 week wine   Drug use: Not Currently    Frequency: 7.0 times per week    Types: Marijuana    Comment: none since preg    Allergies:  Allergies  Allergen Reactions   Latex Rash    Medications Prior to Admission  Medication Sig Dispense Refill Last Dose/Taking   acetaminophen  (TYLENOL ) 500 MG tablet Take 2 tablets (1,000 mg total) by mouth every 6 (six) hours as needed for moderate pain (pain score 4-6). 30 tablet 0 Past Week   furosemide  (LASIX ) 20 MG tablet Take 1 tablet (20 mg total) by mouth daily. 3 tablet 0 07/05/2024  ibuprofen  (ADVIL ) 600 MG tablet Take 1 tablet (600 mg total) by mouth every 6 (six) hours as needed for moderate pain (pain score 4-6). 30 tablet 0 07/05/2024   NIFEdipine  (ADALAT  CC) 30 MG 24 hr tablet Take 1 tablet (30 mg total) by mouth daily. 30 tablet 11 07/05/2024   potassium chloride  SA (KLOR-CON  M) 20 MEQ tablet Take 1 tablet (20 mEq total) by mouth daily. 3 tablet 0 07/05/2024   Prenatal Vit-Fe Fumarate-FA (PRENATAL PO) Take 1 tablet by mouth daily.   Past Week   senna-docusate (SENOKOT-S) 8.6-50 MG tablet Take 2 tablets by mouth at bedtime as needed for mild constipation or moderate constipation. 30 tablet 0 Unknown    Review of Systems  Constitutional:  Negative for chills, fatigue and fever.  Eyes:  Positive for redness. Negative for photophobia, pain and visual disturbance.  Respiratory:  Negative for apnea, shortness of breath and wheezing.   Cardiovascular:  Negative for chest pain and  palpitations.  Gastrointestinal:  Negative for abdominal pain, constipation, diarrhea, nausea and vomiting.  Genitourinary:  Negative for difficulty urinating, dysuria, pelvic pain, vaginal bleeding, vaginal discharge and vaginal pain.  Musculoskeletal:  Negative for back pain.  Neurological:  Positive for headaches. Negative for dizziness, seizures and weakness.  Psychiatric/Behavioral:  Negative for suicidal ideas.    Physical Exam   Blood pressure 119/78, temperature 98.4 F (36.9 C), temperature source Oral, resp. rate 18, height 5' 4 (1.626 m), weight 84.5 kg, SpO2 97%, unknown if currently breastfeeding.  Physical Exam Vitals and nursing note reviewed.  Constitutional:      General: She is not in acute distress.    Appearance: Normal appearance.  HENT:     Head: Normocephalic.  Cardiovascular:     Rate and Rhythm: Normal rate and regular rhythm.  Pulmonary:     Effort: Pulmonary effort is normal.  Musculoskeletal:     Cervical back: Normal range of motion.  Skin:    General: Skin is warm and dry.  Neurological:     Mental Status: She is alert and oriented to person, place, and time.     GCS: GCS eye subscore is 4. GCS verbal subscore is 5. GCS motor subscore is 6.  Psychiatric:        Mood and Affect: Mood normal.     MAU Course  Procedures Orders Placed This Encounter  Procedures   CBC with Differential/Platelet   Comprehensive metabolic panel   Meds ordered this encounter  Medications   acetaminophen -caffeine  (EXCEDRIN TENSION HEADACHE) 500-65 MG per tablet 2 tablet  Patient Vitals for the past 24 hrs:  BP Temp Temp src Pulse Resp SpO2 Height Weight  07/05/24 1800 133/76 -- -- (!) 115 -- -- -- --  07/05/24 1745 139/81 -- -- (!) 117 -- -- -- --  07/05/24 1552 119/78 98.4 F (36.9 C) Oral -- 18 97 % 5' 4 (1.626 m) 84.5 kg     MDM - Patient given crackers and caffeinated soda while awaiting results.  - Also ordered PO Excedrin for headache,  - PreE labs  collected given gHTN on pregnancy and having to be started on Procardia  and Lasix .  - Liver Enzymes mildly elevated, However BPs normal on Procardia , and Headache improved with PO Excedrin and Food. Headache now a 0/10. Continues to other preE symptoms.   - Consulted Dr. Nicholaus for plan of care. Reviewed patient presentation labs and current clinical picture. Per MD plan to send home given improvement of symptoms with normal BP  and plan to reassess Lab work at Visit on Wednesday. Send home with precautions.  - plan for discharge.   Assessment and Plan   1. Headache in pregnancy, postpartum   2. Elevated LFTs   3. Postpartum care and examination    - Reviewed results and MD recommendation. Patient excited for plan to go home.  - Reviewed worsening signs and return precautions. - Message sent to office to add on preE labs to visit.  - Patient discharged home in stable condition and may return to MUA as needed.   Claris CHRISTELLA Cedar, MSN CNM  07/05/2024, 4:00 PM

## 2024-07-05 NOTE — MAU Note (Signed)
 EARLE BURSON is a 33 y.o. at Unknown here in MAU reporting: PP vag on 07/02/24 Was discharged yesterday but has been here in the NICU. Reporting a headache, took 600mg  ibuprofen  around 1315. Feels like her face is flushed. Has not eaten since 0800 this am, reports drinking water all day. Has not tried tylenol . States her neck on the right side feels sore and right side of her forehead. Denies any N/V/D. Just woke up from a 2 hour nap.    Onset of complaint: today Pain score: 6-with meds  8-without meds  Vitals:   07/05/24 1552  BP: 119/78  Resp: 18  Temp: 98.4 F (36.9 C)  SpO2: 97%     Lab orders placed from triage:

## 2024-07-07 ENCOUNTER — Inpatient Hospital Stay (HOSPITAL_COMMUNITY)

## 2024-07-09 ENCOUNTER — Ambulatory Visit

## 2024-07-09 DIAGNOSIS — R748 Abnormal levels of other serum enzymes: Secondary | ICD-10-CM

## 2024-07-09 NOTE — Progress Notes (Signed)
 Subjective:  Ashley Watson is a [id number] here for BP check.  She is 1 week postpartum following a normal spontaneous vaginal delivery.    Hypertension ROS: Patient has headache, mild, rating less than 1 on pain scale, reported needs to eat something, no blurred vision, no RUQ.   Objective: BP 113/76, Pulse 87 Ht 5' 4 (1.626 m)   Wt 184 lb (83.5 kg)   Breastfeeding Yes   BMI 31.58 kg/m   Appearance alert, well appearing, and in no distress.  Assessment:   Blood Pressure well controlled and stable.   Plan:  Current treatment plan is effective, no change in therapy. CBC and CMP ordered per plan. RN reviewed blood pressure values, monitoring symptoms, when to notify provider and or go to MAU. Pt verbalized understanding.    Silvano LELON Piano, RN

## 2024-07-10 ENCOUNTER — Ambulatory Visit: Payer: Self-pay | Admitting: Obstetrics and Gynecology

## 2024-07-10 DIAGNOSIS — R7989 Other specified abnormal findings of blood chemistry: Secondary | ICD-10-CM | POA: Insufficient documentation

## 2024-07-10 LAB — CBC
Hematocrit: 42.1 % (ref 34.0–46.6)
Hemoglobin: 13.4 g/dL (ref 11.1–15.9)
MCH: 28.5 pg (ref 26.6–33.0)
MCHC: 31.8 g/dL (ref 31.5–35.7)
MCV: 89 fL (ref 79–97)
Platelets: 286 x10E3/uL (ref 150–450)
RBC: 4.71 x10E6/uL (ref 3.77–5.28)
RDW: 13.6 % (ref 11.7–15.4)
WBC: 12.3 x10E3/uL — ABNORMAL HIGH (ref 3.4–10.8)

## 2024-07-10 LAB — COMPREHENSIVE METABOLIC PANEL WITH GFR
ALT: 53 IU/L — ABNORMAL HIGH (ref 0–32)
AST: 45 IU/L — ABNORMAL HIGH (ref 0–40)
Albumin: 4.3 g/dL (ref 3.9–4.9)
Alkaline Phosphatase: 130 IU/L — ABNORMAL HIGH (ref 41–116)
BUN/Creatinine Ratio: 21 (ref 9–23)
BUN: 16 mg/dL (ref 6–20)
Bilirubin Total: 0.3 mg/dL (ref 0.0–1.2)
CO2: 21 mmol/L (ref 20–29)
Calcium: 9.9 mg/dL (ref 8.7–10.2)
Chloride: 101 mmol/L (ref 96–106)
Creatinine, Ser: 0.76 mg/dL (ref 0.57–1.00)
Globulin, Total: 3 g/dL (ref 1.5–4.5)
Glucose: 74 mg/dL (ref 70–99)
Potassium: 4.2 mmol/L (ref 3.5–5.2)
Sodium: 141 mmol/L (ref 134–144)
Total Protein: 7.3 g/dL (ref 6.0–8.5)
eGFR: 106 mL/min/1.73 (ref >59)

## 2024-07-10 LAB — BIRTH TISSUE RECOVERY COLLECTION (PLACENTA DONATION)

## 2024-07-30 ENCOUNTER — Ambulatory Visit: Admitting: Obstetrics and Gynecology

## 2024-08-14 ENCOUNTER — Ambulatory Visit: Admitting: Obstetrics and Gynecology

## 2024-08-20 ENCOUNTER — Ambulatory Visit: Admitting: Obstetrics & Gynecology

## 2024-08-25 ENCOUNTER — Encounter: Payer: Self-pay | Admitting: Medical-Surgical

## 2024-08-28 ENCOUNTER — Encounter: Payer: Self-pay | Admitting: Medical-Surgical

## 2024-08-28 ENCOUNTER — Ambulatory Visit: Admitting: Medical-Surgical

## 2024-08-28 VITALS — BP 111/72 | HR 81 | Resp 20 | Ht 64.0 in | Wt 176.0 lb

## 2024-08-28 DIAGNOSIS — R7989 Other specified abnormal findings of blood chemistry: Secondary | ICD-10-CM

## 2024-08-28 DIAGNOSIS — Z7689 Persons encountering health services in other specified circumstances: Secondary | ICD-10-CM

## 2024-08-28 NOTE — Progress Notes (Signed)
 "       Established patient visit   History of Present Illness   Discussed the use of AI scribe software for clinical note transcription with the patient, who gave verbal consent to proceed.  History of Present Illness   Ashley Watson is a 34 year old female who presents with an orange discoloration on her finger.  Cutaneous discoloration - Orange discoloration appeared on the lateral side of her right ring finger on Tuesday. - Discoloration has mostly faded, leaving a small dot. - No pain, pruritus, injury, insect bite, or exposure to orange products.  Hepatic laboratory abnormalities - Elevated liver enzymes in December, which subsequently decreased slightly. - Missed follow up laboratory testing due to caring for her newborn. - Questions if discoloration could be related to hepatic function.  Gestational diabetes mellitus and blood glucose monitoring - History of gestational diabetes during pregnancy. - Recent fasting blood glucose measured at 105 mg/dL. - Performs home blood glucose monitoring.  Hypertensive disorders of pregnancy - Elevated blood pressure at the end of pregnancy and during delivery. - Blood pressure currently stable.  Lactation and menstrual irregularity - Not breastfeeding but is pumping milk. - Delayed return of menses while pumping. - Currently not using birth control and prefers to avoid additional medications. - Aware that menstrual cycles can be delayed for months postpartum.  Remote musculoskeletal injuries - Remote history of hand fracture and shoulder injury from exercise. - Does not believe these are related to current cutaneous findings.      Physical Exam   Physical Exam Vitals reviewed.  Constitutional:      General: She is not in acute distress.    Appearance: Normal appearance. She is not ill-appearing.  HENT:     Head: Normocephalic and atraumatic.  Cardiovascular:     Rate and Rhythm: Normal rate and regular rhythm.      Pulses: Normal pulses.     Heart sounds: Normal heart sounds. No murmur heard.    No friction rub. No gallop.  Pulmonary:     Effort: Pulmonary effort is normal. No respiratory distress.     Breath sounds: Normal breath sounds. No wheezing.  Skin:    General: Skin is warm and dry.     Comments: 2mm orange discoloration to the lateral right ring finger at the side of the nail. No elevation, tenderness, fluctuance.   Neurological:     Mental Status: She is alert and oriented to person, place, and time.  Psychiatric:        Mood and Affect: Mood normal.        Behavior: Behavior normal.        Thought Content: Thought content normal.        Judgment: Judgment normal.    Assessment & Plan   Encounter to establish care - Reviewed available information and discussed care concerns with patient.   Elevated liver enzymes Previous episodes with no clear etiology. Recent elevation during pregnancy improved. Unlikely dietary cause. - Monitor liver enzyme levels if symptoms develop. - Consider dietary factors if symptoms persist.  General health maintenance Postpartum health and breastfeeding discussed. Periods may be delayed due to hormonal changes. Birth control in place, but ovulation possible without menstruation. - Discussed amenorrhea is not guaranteed as birth control. - Monitor menstrual cycle and ovulation status. - Schedule annual physical examination when due.     Follow up   Return if symptoms worsen or fail to improve. __________________________________ Zada FREDRIK Palin, DNP, APRN, FNP-BC  Primary Care and Sports Medicine [facility] "
